# Patient Record
Sex: Male | Born: 1940 | Race: White | Hispanic: No | Marital: Single | Smoking: Former smoker
Health system: Southern US, Community
[De-identification: ages and names within clinical notes are randomized; demographics above are authoritative.]

## PROBLEM LIST (undated history)

## (undated) DIAGNOSIS — G4700 Insomnia, unspecified: Secondary | ICD-10-CM

## (undated) DIAGNOSIS — I10 Essential (primary) hypertension: Secondary | ICD-10-CM

## (undated) DIAGNOSIS — I4891 Unspecified atrial fibrillation: Secondary | ICD-10-CM

## (undated) DIAGNOSIS — F32A Depression, unspecified: Secondary | ICD-10-CM

## (undated) DIAGNOSIS — E785 Hyperlipidemia, unspecified: Secondary | ICD-10-CM

## (undated) DIAGNOSIS — E119 Type 2 diabetes mellitus without complications: Secondary | ICD-10-CM

## (undated) DIAGNOSIS — F329 Major depressive disorder, single episode, unspecified: Secondary | ICD-10-CM

## (undated) DIAGNOSIS — E039 Hypothyroidism, unspecified: Secondary | ICD-10-CM

## (undated) HISTORY — PX: SHOULDER SURGERY: SHX246

## (undated) HISTORY — PX: APPENDECTOMY: SHX54

---

## 2004-06-03 ENCOUNTER — Emergency Department (HOSPITAL_COMMUNITY): Admission: EM | Admit: 2004-06-03 | Discharge: 2004-06-03 | Payer: Self-pay | Admitting: Emergency Medicine

## 2010-08-18 ENCOUNTER — Emergency Department (HOSPITAL_COMMUNITY): Payer: PRIVATE HEALTH INSURANCE

## 2010-08-18 ENCOUNTER — Encounter: Payer: Self-pay | Admitting: Emergency Medicine

## 2010-08-18 ENCOUNTER — Emergency Department (HOSPITAL_COMMUNITY)
Admission: EM | Admit: 2010-08-18 | Discharge: 2010-08-18 | Disposition: A | Discharge status: Home / Self Care | Payer: PRIVATE HEALTH INSURANCE | Attending: Emergency Medicine | Admitting: Emergency Medicine

## 2010-08-18 DIAGNOSIS — Z87891 Personal history of nicotine dependence: Secondary | ICD-10-CM | POA: Insufficient documentation

## 2010-08-18 DIAGNOSIS — E119 Type 2 diabetes mellitus without complications: Secondary | ICD-10-CM | POA: Insufficient documentation

## 2010-08-18 DIAGNOSIS — N453 Epididymo-orchitis: Secondary | ICD-10-CM | POA: Insufficient documentation

## 2010-08-18 DIAGNOSIS — I10 Essential (primary) hypertension: Secondary | ICD-10-CM | POA: Insufficient documentation

## 2010-08-18 DIAGNOSIS — E079 Disorder of thyroid, unspecified: Secondary | ICD-10-CM | POA: Insufficient documentation

## 2010-08-18 DIAGNOSIS — E785 Hyperlipidemia, unspecified: Secondary | ICD-10-CM | POA: Insufficient documentation

## 2010-08-18 DIAGNOSIS — N390 Urinary tract infection, site not specified: Secondary | ICD-10-CM | POA: Insufficient documentation

## 2010-08-18 HISTORY — DX: Insomnia, unspecified: G47.00

## 2010-08-18 HISTORY — DX: Hyperlipidemia, unspecified: E78.5

## 2010-08-18 LAB — URINALYSIS, ROUTINE W REFLEX MICROSCOPIC
Bilirubin Urine: NEGATIVE
Nitrite: POSITIVE — AB
Protein, ur: NEGATIVE mg/dL
Specific Gravity, Urine: 1.025 (ref 1.005–1.030)
Urobilinogen, UA: 0.2 mg/dL (ref 0.0–1.0)

## 2010-08-18 LAB — BASIC METABOLIC PANEL
BUN: 17 mg/dL (ref 6–23)
Calcium: 9.4 mg/dL (ref 8.4–10.5)
Creatinine, Ser: 1.16 mg/dL (ref 0.50–1.35)
GFR calc Af Amer: >60 mL/min (ref >60)

## 2010-08-18 LAB — CBC
HCT: 44.6 % (ref 39.0–52.0)
MCHC: 33 g/dL (ref 30.0–36.0)
Platelets: 269 10*3/uL (ref 150–400)
RDW: 14.4 % (ref 11.5–15.5)
WBC: 26.8 10*3/uL — ABNORMAL HIGH (ref 4.0–10.5)

## 2010-08-18 LAB — URINE MICROSCOPIC-ADD ON

## 2010-08-18 MED ORDER — HYDROCODONE-ACETAMINOPHEN 5-325 MG PO TABS: 2.0000 | ORAL_TABLET | ORAL | Status: AC | PRN

## 2010-08-18 MED ORDER — CEFTRIAXONE SODIUM 250 MG IJ SOLR
250.0000 mg | Freq: Once | INTRAMUSCULAR | Status: AC
  Administered 2010-08-18: 250 mg via INTRAMUSCULAR
  Filled 2010-08-18: qty 250

## 2010-08-18 MED ORDER — CIPROFLOXACIN HCL 500 MG PO TABS: 500.0000 mg | ORAL_TABLET | Freq: Two times a day (BID) | ORAL | Status: AC

## 2010-08-18 MED ORDER — DOXYCYCLINE HYCLATE 100 MG PO TABS
100.0000 mg | ORAL_TABLET | Freq: Once | ORAL | Status: AC
  Administered 2010-08-18: 100 mg via ORAL
  Filled 2010-08-18: qty 1

## 2010-08-18 MED ORDER — DOXYCYCLINE HYCLATE 100 MG PO CAPS: 100.0000 mg | ORAL_CAPSULE | Freq: Two times a day (BID) | ORAL | Status: AC

## 2010-08-18 NOTE — ED Notes (Signed)
Pt c/o R groin pain radiating into back. Began late in the night yesterday. No nausea, vomiting or diarrhea.

## 2010-08-18 NOTE — ED Notes (Signed)
Hemocult performed by MD. Test negative.

## 2010-08-18 NOTE — ED Provider Notes (Signed)
Scribed for Dr. Manus Gunning, the patient was seen in room 9. This chart was scribed by Hillery Hunter. This patient's care was started at 17:50.    History     CSN: [id number] Arrival date & time: 08/18/2010  5:46 PM  Chief Complaint  Patient presents with  . Groin Pain    radiates to back and through R groin area   Patient is a 70 y.o. male presenting with testicular pain. The history is provided by the patient.  Testicle Pain This is a new problem. The current episode started more than 1 week ago. Progression since onset: waxing and waning. Pertinent negatives include no abdominal pain.    Patient complains of two weeks of right lower back pain and testicular pain that has waxed and waned and is worse with palpation of the right testicle. He denies numbness, tingling, injury to the area of pain, weakness, dysuria, hematuria, abdominal pain, fever, diarrhea, nausea, or any other complaints. He has a history of DM and denies CAD and lung dz hx.   Past Medical History  Diagnosis Date  . Diabetes mellitus   . Thyroid disease   . Hyperlipidemia   . Hypertension   . Insomnia     Past Surgical History  Procedure Date  . Appendectomy   . Shoulder surgery     Family History  Problem Relation Age of Onset  . Cancer Sister     History  Substance Use Topics  . Smoking status: Former Smoker    Quit date: 08/18/1990  . Smokeless tobacco: Never Used  . Alcohol Use: No      Review of Systems  Constitutional: Negative for fever.  Gastrointestinal: Negative for nausea, abdominal pain and diarrhea.  Genitourinary: Positive for testicular pain. Negative for dysuria and hematuria.  Neurological: Negative for numbness.  All other systems reviewed and are negative.    Physical Exam  BP 168/68  Pulse 119  Temp(Src) 98.2 F (36.8 C) (Oral)  Resp 20  Ht 5\' 9"  (1.753 m)  Wt 199 lb (90.266 kg)  BMI 29.39 kg/m2  SpO2 97%  Physical Exam  Nursing note and vitals  reviewed. Constitutional: He is oriented to person, place, and time. He appears well-developed and well-nourished.  HENT:  Head: Normocephalic and atraumatic.  Neck: Neck supple.  Cardiovascular: Normal rate and regular rhythm.   No murmur heard. Pulmonary/Chest: Breath sounds normal. No respiratory distress.  Abdominal: Bowel sounds are normal. He exhibits no distension and no mass. There is no tenderness.  Genitourinary: Rectum normal and penis normal. Guaiac negative stool (control positive).       Left testicle 3x size of right testicle and tender to palpation. No hernia appreciated  Musculoskeletal: He exhibits no edema and no tenderness.  Neurological: He is alert and oriented to person, place, and time.  Skin: Skin is warm and dry.  Psychiatric: He has a normal mood and affect. His behavior is normal.    ED Course  Procedures  OTHER DATA REVIEWED: Nursing notes, vital signs reviewed   DIAGNOSTIC STUDIES: Oxygen Saturation is 97% on room air, normal by my interpretation.     LABS / RADIOLOGY:  Results for orders placed during the hospital encounter of 08/18/10  BASIC METABOLIC PANEL      Component Value Range   Sodium 137  135 - 145 (mEq/L)   Potassium 3.6  3.5 - 5.1 (mEq/L)   Chloride 100  96 - 112 (mEq/L)   CO2 18 (*) 19 - 32 (mEq/L)  Glucose, Bld 286 (*) 70 - 99 (mg/dL)   BUN 17  6 - 23 (mg/dL)   Creatinine, Ser 4.09  0.50 - 1.35 (mg/dL)   Calcium 9.4  8.4 - 81.1 (mg/dL)   GFR calc non Af Amer >60  >60 (mL/min)   GFR calc Af Amer >60  >60 (mL/min)  CBC      Component Value Range   WBC 26.8 (*) 4.0 - 10.5 (K/uL)   RBC 5.53  4.22 - 5.81 (MIL/uL)   Hemoglobin 14.7  13.0 - 17.0 (g/dL)   HCT 91.4  78.2 - 95.6 (%)   MCV 80.7  78.0 - 100.0 (fL)   MCH 26.6  26.0 - 34.0 (pg)   MCHC 33.0  30.0 - 36.0 (g/dL)   RDW 21.3  08.6 - 57.8 (%)   Platelets 269  150 - 400 (K/uL)  URINALYSIS, ROUTINE W REFLEX MICROSCOPIC      Component Value Range   Color, Urine YELLOW   YELLOW    Appearance CLEAR  CLEAR    Specific Gravity, Urine 1.025  1.005 - 1.030    pH 5.5  5.0 - 8.0    Glucose, UA >1000 (*) NEGATIVE (mg/dL)   Hgb urine dipstick SMALL (*) NEGATIVE    Bilirubin Urine NEGATIVE  NEGATIVE    Ketones, ur 40 (*) NEGATIVE (mg/dL)   Protein, ur NEGATIVE  NEGATIVE (mg/dL)   Urobilinogen, UA 0.2  0.0 - 1.0 (mg/dL)   Nitrite POSITIVE (*) NEGATIVE    Leukocytes, UA NEGATIVE  NEGATIVE   URINE MICROSCOPIC-ADD ON      Component Value Range   Squamous Epithelial / LPF FEW (*) RARE    WBC, UA 11-20  <3 (WBC/hpf)   RBC / HPF 3-6  <3 (RBC/hpf)   Bacteria, UA MANY (*) RARE       SCROTAL ULTRASOUND DOPPLER ULTRASOUND OF THE TESTICLES  Technique: Complete ultrasound examination of the testicles, epididymis, and other scrotal structures was performed. Color and spectral Doppler ultrasound were also utilized to evaluate blood flow to the testicles.  Comparison: None  Findings:  Right testis: Normal in size and appearance, measuring 4.1 x 3.6 x 3.6 cm  Left testis: Normal in size and appearance, measuring 3.8 x 2.5 x 3.1 cm  Right epididymis: Enlarged, hypervascular epididymal head, measuring 1.2 cm.  Left epididymis: Normal in size and appearance.  Hydocele: Present bilaterally.  Varicocele: Absent bilaterally.  Pulsed Doppler interrogation of both testes demonstrates low resistance flow bilaterally.  IMPRESSION: Enlarged, hypervascular right epididymal head, suggesting epididymitis.  Normal appearance of the bilateral testes. No evidence of testicular torsion.  Original Report Authenticated By: Charline Bills, M.D.   MDM: Differential Diagnosis: Testicular pain, assymetric on exam, rule out torsion.  No prostate tenderness on exam. US scrotum, UA, chem7, CBC, symptom control.  Tolerating PO in ED.  No abdominal pain or vomiting.    IMPRESSION: Diagnoses that have been ruled out:  Diagnoses that are still under consideration:    Final diagnoses:     PLAN:  Home Narcotic pain medication The patient is to return the emergency department if there is any worsening of symptoms. I have reviewed the discharge instructions with the patient  CONDITION ON DISCHARGE: Stable   MEDICATIONS GIVEN IN THE E.D.  Medications  lisinopril (PRINIVIL,ZESTRIL) 20 MG tablet (not administered)  glyBURIDE-metformin (GLUCOVANCE) 5-500 MG per tablet (not administered)  levothyroxine (SYNTHROID, LEVOTHROID) 25 MCG tablet (not administered)  simvastatin (ZOCOR) 10 MG tablet (not administered)  zolpidem (AMBIEN) 10  MG tablet (not administered)  aspirin 325 MG tablet (not administered)     DISCHARGE MEDICATIONS: New Prescriptions   No medications on file    I personally performed the services described in this documentation, which was scribed in my presence.  The recorded information has been reviewed and considered.         Glynn Octave, MD 08/19/10 [phone]

## 2010-08-19 ENCOUNTER — Encounter (HOSPITAL_COMMUNITY): Payer: Self-pay

## 2011-03-29 ENCOUNTER — Telehealth: Payer: Self-pay

## 2011-03-29 ENCOUNTER — Other Ambulatory Visit: Payer: Self-pay

## 2011-03-29 DIAGNOSIS — Z139 Encounter for screening, unspecified: Secondary | ICD-10-CM

## 2011-03-29 NOTE — Telephone Encounter (Signed)
MOVI PREP SPLIT DOSING, REGULAR BREAKFAST. CLEAR LIQUIDS AFTER 9 AM.  HOLD GLUCOVANCE ON AM OF TCS.

## 2011-03-29 NOTE — Telephone Encounter (Signed)
Gastroenterology Pre-Procedure Form     Request Date: 03/29/2011                Requesting Physician: Dr. Regino Schultze     PATIENT INFORMATION:  Jeffrey [name] is a 71 y.o., male (DOB=[birth year]).  PROCEDURE: Procedure(s) requested: colonoscopy Procedure Reason: screening for colon cancer  PATIENT REVIEW QUESTIONS: The patient reports the following:   1. Diabetes Melitis: yes  2. Joint replacements in the past 12 months: no 3. Major health problems in the past 3 months: no 4. Has an artificial valve or MVP:no 5. Has been advised in past to take antibiotics in advance of a procedure like teeth cleaning: no}    MEDICATIONS & ALLERGIES:    Patient reports the following regarding taking any blood thinners:   Plavix? no Aspirin?no Coumadin?  no  Patient confirms/reports the following medications:  Current Outpatient Prescriptions  Medication Sig Dispense Refill  . glyBURIDE-metformin (GLUCOVANCE) 5-500 MG per tablet Take 1-2 tablets by mouth daily. Pt said he takes two tablets in the AM, one at lunch, and one at bedtime      . levothyroxine (SYNTHROID, LEVOTHROID) 25 MCG tablet Take 50 mcg by mouth daily.       Marland Kitchen lisinopril (PRINIVIL,ZESTRIL) 20 MG tablet Take 20 mg by mouth daily.        . Multiple Vitamin (MULTIVITAMIN) tablet Take 1 tablet by mouth daily.      Marland Kitchen aspirin 325 MG tablet Take 325 mg by mouth daily as needed. For headaches       . simvastatin (ZOCOR) 10 MG tablet Take 10 mg by mouth at bedtime.        Marland Kitchen zolpidem (AMBIEN) 10 MG tablet Take 10 mg by mouth at bedtime as needed. For sleep         Patient confirms/reports the following allergies:  Allergies  Allergen Reactions  . Penicillins Rash    Patient is appropriate to schedule for requested procedure(s): yes  AUTHORIZATION INFORMATION Primary Insurance:   ID #:   Group #:  Pre-Cert / Auth required:  Pre-Cert / Auth #:   Secondary Insurance:   ID #:   Group #:  Pre-Cert / Auth required:  Pre-Cert / Auth  #:  No orders of the defined types were placed in this encounter.    SCHEDULE INFORMATION: Procedure has been scheduled as follows:  Date: 04/30/2011      Time: 8:30 AM  Location: [facility] Short Stay  This Gastroenterology Pre-Precedure Form is being routed to the following provider(s) for review: Jonette Eva, MD

## 2011-03-30 NOTE — Telephone Encounter (Signed)
Rx and instructions mailed.  

## 2011-04-14 ENCOUNTER — Telehealth: Payer: Self-pay

## 2011-04-14 NOTE — Telephone Encounter (Signed)
REVIEWED.  

## 2011-04-14 NOTE — Telephone Encounter (Signed)
Called pt to update triage. No new medical problems and no new meds, except he forgot to include The Fish Oil 1200 mg daily. Added that to the med list. Pt has his prescription and instructions. His procedure is scheduled for 04/30/2011.

## 2011-04-30 ENCOUNTER — Ambulatory Visit (HOSPITAL_COMMUNITY)
Admission: RE | Admit: 2011-04-30 | Discharge: 2011-04-30 | Disposition: A | Discharge status: Home / Self Care | Payer: Medicare Other | Source: Ambulatory Visit | Attending: Gastroenterology | Admitting: Gastroenterology

## 2011-04-30 ENCOUNTER — Encounter (HOSPITAL_COMMUNITY): Payer: Self-pay | Admitting: *Deleted

## 2011-04-30 ENCOUNTER — Encounter (HOSPITAL_COMMUNITY)
Admission: RE | Disposition: A | Discharge status: Home / Self Care | Payer: Self-pay | Source: Ambulatory Visit | Attending: Gastroenterology

## 2011-04-30 DIAGNOSIS — K5730 Diverticulosis of large intestine without perforation or abscess without bleeding: Secondary | ICD-10-CM

## 2011-04-30 DIAGNOSIS — E7800 Pure hypercholesterolemia, unspecified: Secondary | ICD-10-CM | POA: Insufficient documentation

## 2011-04-30 DIAGNOSIS — K648 Other hemorrhoids: Secondary | ICD-10-CM

## 2011-04-30 DIAGNOSIS — Z1211 Encounter for screening for malignant neoplasm of colon: Secondary | ICD-10-CM | POA: Insufficient documentation

## 2011-04-30 DIAGNOSIS — E785 Hyperlipidemia, unspecified: Secondary | ICD-10-CM | POA: Insufficient documentation

## 2011-04-30 DIAGNOSIS — D126 Benign neoplasm of colon, unspecified: Secondary | ICD-10-CM

## 2011-04-30 DIAGNOSIS — Z01812 Encounter for preprocedural laboratory examination: Secondary | ICD-10-CM | POA: Insufficient documentation

## 2011-04-30 DIAGNOSIS — Z79899 Other long term (current) drug therapy: Secondary | ICD-10-CM | POA: Insufficient documentation

## 2011-04-30 DIAGNOSIS — Z139 Encounter for screening, unspecified: Secondary | ICD-10-CM

## 2011-04-30 DIAGNOSIS — E119 Type 2 diabetes mellitus without complications: Secondary | ICD-10-CM | POA: Insufficient documentation

## 2011-04-30 DIAGNOSIS — I10 Essential (primary) hypertension: Secondary | ICD-10-CM | POA: Insufficient documentation

## 2011-04-30 HISTORY — DX: Hypothyroidism, unspecified: E03.9

## 2011-04-30 HISTORY — DX: Depression, unspecified: F32.A

## 2011-04-30 HISTORY — DX: Major depressive disorder, single episode, unspecified: F32.9

## 2011-04-30 HISTORY — PX: COLONOSCOPY: SHX5424

## 2011-04-30 SURGERY — COLONOSCOPY
Anesthesia: Moderate Sedation

## 2011-04-30 MED ORDER — MIDAZOLAM HCL 5 MG/5ML IJ SOLN
INTRAMUSCULAR | Status: DC | PRN
  Administered 2011-04-30 (×2): 2 mg via INTRAVENOUS

## 2011-04-30 MED ORDER — STERILE WATER FOR IRRIGATION IR SOLN
Status: DC | PRN
  Administered 2011-04-30: 08:00:00

## 2011-04-30 MED ORDER — MEPERIDINE HCL 100 MG/ML IJ SOLN
INTRAMUSCULAR | Status: AC
  Filled 2011-04-30: qty 2

## 2011-04-30 MED ORDER — MEPERIDINE HCL 100 MG/ML IJ SOLN
INTRAMUSCULAR | Status: DC | PRN
  Administered 2011-04-30: 25 mg via INTRAVENOUS
  Administered 2011-04-30: 50 mg via INTRAVENOUS

## 2011-04-30 MED ORDER — SODIUM CHLORIDE 0.45 % IV SOLN
Freq: Once | INTRAVENOUS | Status: AC
  Administered 2011-04-30: 08:00:00 via INTRAVENOUS

## 2011-04-30 MED ORDER — MIDAZOLAM HCL 5 MG/5ML IJ SOLN
INTRAMUSCULAR | Status: AC
  Filled 2011-04-30: qty 10

## 2011-04-30 NOTE — Discharge Instructions (Signed)
You had 1 small polyps removed. You have internal hemorrhoids and diverticulosis.  FOLLOW A HIGH FIBER DIET. AVOID ITEMS THAT CAUSE BLOATING. SEE INFO BELOW. YOUR BIOPSY RESULTS SHOULD BE BACK IN 7 DAYS. Next colonoscopy in 10 years. Colonoscopy Care After Read the instructions outlined below and refer to this sheet in the next week. These discharge instructions provide you with general information on caring for yourself after you leave the hospital. While your treatment has been planned according to the most current medical practices available, unavoidable complications occasionally occur. If you have any problems or questions after discharge, call DR. Laderius Valbuena, [phone].  ACTIVITY  You may resume your regular activity, but move at a slower pace for the next 24 hours.   Take frequent rest periods for the next 24 hours.   Walking will help get rid of the air and reduce the bloated feeling in your belly (abdomen).   No driving for 24 hours (because of the medicine (anesthesia) used during the test).   You may shower.   Do not sign any important legal documents or operate any machinery for 24 hours (because of the anesthesia used during the test).    NUTRITION  Drink plenty of fluids.   You may resume your normal diet as instructed by your doctor.   Begin with a light meal and progress to your normal diet. Heavy or fried foods are harder to digest and may make you feel sick to your stomach (nauseated).   Avoid alcoholic beverages for 24 hours or as instructed.    MEDICATIONS  You may resume your normal medications.   WHAT YOU CAN EXPECT TODAY  Some feelings of bloating in the abdomen.   Passage of more gas than usual.   Spotting of blood in your stool or on the toilet paper  .  IF YOU HAD POLYPS REMOVED DURING THE COLONOSCOPY:  Eat a soft diet IF YOU HAVE NAUSEA, BLOATING, ABDOMINAL PAIN, OR VOMITING.    FINDING OUT THE RESULTS OF YOUR TEST Not all test results  are available during your visit. DR. Darrick Penna WILL CALL YOU WITHIN 7 DAYS OF YOUR PROCEDUE WITH YOUR RESULTS. Do not assume everything is normal if you have not heard from DR. Tremain Rucinski IN ONE WEEK, CALL HER OFFICE AT [phone].  SEEK IMMEDIATE MEDICAL ATTENTION AND CALL THE OFFICE: [phone] IF:  You have more than a spotting of blood in your stool.   Your belly is swollen (abdominal distention).   You are nauseated or vomiting.   You have a temperature over 101F.   You have abdominal pain or discomfort that is severe or gets worse throughout the day.  Polyps, Colon  A polyp is extra tissue that grows inside your body. Colon polyps grow in the large intestine. The large intestine, also called the colon, is part of your digestive system. It is a long, hollow tube at the end of your digestive tract where your body makes and stores stool. Most polyps are not dangerous. They are benign. This means they are not cancerous. But over time, some types of polyps can turn into cancer. Polyps that are smaller than a pea are usually not harmful. But larger polyps could someday become or may already be cancerous. To be safe, doctors remove all polyps and test them.   WHO GETS POLYPS? Anyone can get polyps, but certain people are more likely than others. You may have a greater chance of getting polyps if:  You are over 50.  You have had polyps before.   Someone in your family has had polyps.   Someone in your family has had cancer of the large intestine.   Find out if someone in your family has had polyps. You may also be more likely to get polyps if you:   Eat a lot of fatty foods   Smoke   Drink alcohol   Do not exercise  Eat too much   TREATMENT  The caregiver will remove the polyp during sigmoidoscopy or colonoscopy.  PREVENTION There is not one sure way to prevent polyps. You might be able to lower your risk of getting them if you:  Eat more fruits and vegetables and less fatty  food.   Do not smoke.   Avoid alcohol.   Exercise every day.   Lose weight if you are overweight.   Eating more calcium and folate can also lower your risk of getting polyps. Some foods that are rich in calcium are milk, cheese, and broccoli. Some foods that are rich in folate are chickpeas, kidney beans, and spinach.   High-Fiber Diet A high-fiber diet changes your normal diet to include more whole grains, legumes, fruits, and vegetables. Changes in the diet involve replacing refined carbohydrates with unrefined foods. The calorie level of the diet is essentially unchanged. The Dietary Reference Intake (recommended amount) for adult males is 38 grams per day. For adult females, it is 25 grams per day. Pregnant and lactating women should consume 28 grams of fiber per day. Fiber is the intact part of a plant that is not broken down during digestion. Functional fiber is fiber that has been isolated from the plant to provide a beneficial effect in the body. PURPOSE  Increase stool bulk.   Ease and regulate bowel movements.   Lower cholesterol.  INDICATIONS THAT YOU NEED MORE FIBER  Constipation and hemorrhoids.   Uncomplicated diverticulosis (intestine condition) and irritable bowel syndrome.   Weight management.   As a protective measure against hardening of the arteries (atherosclerosis), diabetes, and cancer.   GUIDELINES FOR INCREASING FIBER IN THE DIET  Start adding fiber to the diet slowly. A gradual increase of about 5 more grams (2 slices of whole-wheat bread, 2 servings of most fruits or vegetables, or 1 bowl of high-fiber cereal) per day is best. Too rapid an increase in fiber may result in constipation, flatulence, and bloating.   Drink enough water and fluids to keep your urine clear or pale yellow. Water, juice, or caffeine-free drinks are recommended. Not drinking enough fluid may cause constipation.   Eat a variety of high-fiber foods rather than one type of fiber.     Try to increase your intake of fiber through using high-fiber foods rather than fiber pills or supplements that contain small amounts of fiber.   The goal is to change the types of food eaten. Do not supplement your present diet with high-fiber foods, but replace foods in your present diet.  INCLUDE A VARIETY OF FIBER SOURCES  Replace refined and processed grains with whole grains, canned fruits with fresh fruits, and incorporate other fiber sources. White rice, white breads, and most bakery goods contain little or no fiber.   Brown whole-grain rice, buckwheat oats, and many fruits and vegetables are all good sources of fiber. These include: broccoli, Brussels sprouts, cabbage, cauliflower, beets, sweet potatoes, white potatoes (skin on), carrots, tomatoes, eggplant, squash, berries, fresh fruits, and dried fruits.   Cereals appear to be the richest source of fiber.  Cereal fiber is found in whole grains and bran. Bran is the fiber-rich outer coat of cereal grain, which is largely removed in refining. In whole-grain cereals, the bran remains. In breakfast cereals, the largest amount of fiber is found in those with "bran" in their names. The fiber content is sometimes indicated on the label.   You may need to include additional fruits and vegetables each day.   In baking, for 1 cup white flour, you may use the following substitutions:   1 cup whole-wheat flour minus 2 tablespoons.   1/2 cup white flour plus 1/2 cup whole-wheat flour.   Diverticulosis Diverticulosis is a common condition that develops when small pouches (diverticula) form in the wall of the colon. The risk of diverticulosis increases with age. It happens more often in people who eat a low-fiber diet. Most individuals with diverticulosis have no symptoms. Those individuals with symptoms usually experience belly (abdominal) pain, constipation, or loose stools (diarrhea).  HOME CARE INSTRUCTIONS  Increase the amount of fiber in  your diet as directed by your caregiver or dietician. This may reduce symptoms of diverticulosis.   Drink at least 6 to 8 glasses of water each day to prevent constipation.   Try not to strain when you have a bowel movement.   Avoiding nuts and seeds to prevent complications is still an uncertain benefit.       FOODS HAVING HIGH FIBER CONTENT INCLUDE:  Fruits. Apple, peach, pear, tangerine, raisins, prunes.   Vegetables. Brussels sprouts, asparagus, broccoli, cabbage, carrot, cauliflower, romaine lettuce, spinach, summer squash, tomato, winter squash, zucchini.   Starchy Vegetables. Baked beans, kidney beans, lima beans, split peas, lentils, potatoes (with skin).   Grains. Whole wheat bread, brown rice, bran flake cereal, plain oatmeal, white rice, shredded wheat, bran muffins.    SEEK IMMEDIATE MEDICAL CARE IF:  You develop increasing pain or severe bloating.   You have an oral temperature above 101F.   You develop vomiting or bowel movements that are bloody or black.   Hemorrhoids Hemorrhoids are dilated (enlarged) veins around the rectum. Sometimes clots will form in the veins. This makes them swollen and painful. These are called thrombosed hemorrhoids. Causes of hemorrhoids include:  Constipation.   Straining to have a bowel movement.   HEAVY LIFTING HOME CARE INSTRUCTIONS  Eat a well balanced diet and drink 6 to 8 glasses of water every day to avoid constipation. You may also use a bulk laxative.   Avoid straining to have bowel movements.   Keep anal area dry and clean.   Do not use a donut shaped pillow or sit on the toilet for long periods. This increases blood pooling and pain.   Move your bowels when your body has the urge; this will require less straining and will decrease pain and pressure.

## 2011-04-30 NOTE — H&P (Signed)
  Primary Care Physician:  Kirk Ruths, MD, MD Primary Gastroenterologist:  Dr. Darrick Penna  Pre-Procedure History & Physical: HPI:  Jeffrey [name] is a 71 y.o. male here for screening  Past Medical History  Diagnosis Date  . Diabetes mellitus   . Thyroid disease   . Hyperlipidemia   . Hypertension   . Insomnia   . Hypothyroidism   . Hypercholesteremia   . Depression     Past Surgical History  Procedure Date  . Appendectomy   . Shoulder surgery     Prior to Admission medications   Medication Sig Start Date End Date Taking? Authorizing Provider  fish oil-omega-3 fatty acids 1000 MG capsule Take 1,200 mg by mouth daily.   Yes Historical Provider, MD  glyBURIDE-metformin (GLUCOVANCE) 5-500 MG per tablet Take 1-2 tablets by mouth daily. Pt said he takes two tablets in the AM, one at lunch, and one at bedtime   Yes Historical Provider, MD  levothyroxine (SYNTHROID, LEVOTHROID) 25 MCG tablet Take 50 mcg by mouth daily.    Yes Historical Provider, MD  lisinopril (PRINIVIL,ZESTRIL) 20 MG tablet Take 20 mg by mouth daily.     Yes Historical Provider, MD  naproxen sodium (ANAPROX) 220 MG tablet Take 220 mg by mouth 2 (two) times daily with a meal.   Yes Historical Provider, MD  aspirin 325 MG tablet Take 325 mg by mouth daily as needed. For headaches     Historical Provider, MD  Multiple Vitamin (MULTIVITAMIN) tablet Take 1 tablet by mouth daily.    Historical Provider, MD  simvastatin (ZOCOR) 10 MG tablet Take 10 mg by mouth at bedtime.      Historical Provider, MD  zolpidem (AMBIEN) 10 MG tablet Take 10 mg by mouth at bedtime as needed. For sleep     Historical Provider, MD    Allergies as of 03/29/2011 - Review Complete 03/29/2011  Allergen Reaction Noted  . Penicillins Rash 08/18/2010    Family History  Problem Relation Age of Onset  . Cancer Sister   . Colon cancer Neg Hx     History   Social History  . Marital Status: Single    Spouse Name: N/A    Number of Children:  N/A  . Years of Education: N/A   Occupational History  . Not on file.   Social History Main Topics  . Smoking status: Former Smoker -- 0.5 packs/day    Quit date: 08/18/1990  . Smokeless tobacco: Never Used  . Alcohol Use: No  . Drug Use: No  . Sexually Active: No   Other Topics Concern  . Not on file   Social History Narrative  . No narrative on file    Review of Systems: See HPI, otherwise negative ROS   Physical Exam: BP 191/94  Pulse 86  Temp(Src) 97.8 F (36.6 C) (Oral)  Resp 21  Ht 5\' 9"  (1.753 m)  Wt 192 lb (87.091 kg)  BMI 28.35 kg/m2  SpO2 96% General:   Alert,  pleasant and cooperative in NAD Head:  Normocephalic and atraumatic. Neck:  Supple; Lungs:  Clear throughout to auscultation.    Heart:  Regular rate and rhythm. Abdomen:  Soft, nontender and nondistended. Normal bowel sounds, without guarding, and without rebound.   Neurologic:  Alert and  oriented x4;  grossly normal neurologically.  Impression/Plan:     AVERAGE RISK  PLAN: TCS TODAY

## 2011-04-30 NOTE — Op Note (Signed)
[facility] [address] [place], [place]  [address]  COLONOSCOPY PROCEDURE REPORT  PATIENT:  Jeffrey [name], Jeffrey [name]  MR#:  [id number] BIRTHDATE:  1940-09-27, 70 yrs. old  GENDER:  male  ENDOSCOPIST:  Jonette Eva, MD REF. BY:  Karleen Hampshire, M.D. ASSISTANT:  PROCEDURE DATE:  04/30/2011 PROCEDURE:  Colonoscopy with snare polypectomy  INDICATIONS:  SCREENING  MEDICATIONS:   Demerol 75 mg IV, Versed 4 mg IV  DESCRIPTION OF PROCEDURE:    Physical exam was performed. Informed consent was obtained from the patient after explaining the benefits, risks, and alternatives to procedure.  The patient was connected to monitor and placed in left lateral position. Continuous oxygen was provided by nasal cannula and IV medicine administered through an indwelling cannula.  After administration of sedation and rectal exam, the patient's rectum was intubated and the EC-[id number] ([id number]) colonoscope was advanced under direct visualization to the cecum.  The scope was removed slowly by carefully examining the color, texture, anatomy, and integrity mucosa on the way out.  The patient was recovered in endoscopy and discharged home in satisfactory condition. <<PROCEDUREIMAGES>>  FINDINGS:  A sessile polyp was found at the hepatic flexure  REMOVED VIA SNARE CAUTERY.  RARE Diverticula were found in the sigmoid to descending colon segments.  SMALL Internal Hemorrhoids were found.  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  n. eSIGNED:   Tacie Mccuistion at 04/30/2011 03:05 PM  Felicita Gage, [phone]

## 2011-05-04 ENCOUNTER — Encounter (HOSPITAL_COMMUNITY): Payer: Self-pay | Admitting: Gastroenterology

## 2011-05-05 ENCOUNTER — Telehealth: Payer: Self-pay | Admitting: Gastroenterology

## 2011-05-05 NOTE — Telephone Encounter (Signed)
Faxed to PCP

## 2011-05-05 NOTE — Telephone Encounter (Signed)
Please call pt. He had HYPERPLASTIC POLYPS removed from HIS colon. FOLLOW A High fiber diet. TCS in 10 years.    

## 2011-05-05 NOTE — Telephone Encounter (Signed)
Called and informed pt.  

## 2014-05-14 ENCOUNTER — Encounter (HOSPITAL_COMMUNITY): Payer: Self-pay

## 2014-05-14 ENCOUNTER — Inpatient Hospital Stay (HOSPITAL_COMMUNITY)
Admission: EM | Admit: 2014-05-14 | Discharge: 2014-06-12 | DRG: 216 | Disposition: E | Payer: Commercial Managed Care - HMO | Attending: Cardiothoracic Surgery | Admitting: Cardiothoracic Surgery

## 2014-05-14 ENCOUNTER — Emergency Department (HOSPITAL_COMMUNITY): Payer: Commercial Managed Care - HMO

## 2014-05-14 ENCOUNTER — Other Ambulatory Visit (HOSPITAL_COMMUNITY): Payer: Self-pay

## 2014-05-14 DIAGNOSIS — I6522 Occlusion and stenosis of left carotid artery: Secondary | ICD-10-CM | POA: Diagnosis not present

## 2014-05-14 DIAGNOSIS — I2510 Atherosclerotic heart disease of native coronary artery without angina pectoris: Secondary | ICD-10-CM | POA: Diagnosis present

## 2014-05-14 DIAGNOSIS — G4700 Insomnia, unspecified: Secondary | ICD-10-CM | POA: Diagnosis present

## 2014-05-14 DIAGNOSIS — I2582 Chronic total occlusion of coronary artery: Secondary | ICD-10-CM | POA: Diagnosis present

## 2014-05-14 DIAGNOSIS — E871 Hypo-osmolality and hyponatremia: Secondary | ICD-10-CM | POA: Diagnosis not present

## 2014-05-14 DIAGNOSIS — Z7982 Long term (current) use of aspirin: Secondary | ICD-10-CM

## 2014-05-14 DIAGNOSIS — Z419 Encounter for procedure for purposes other than remedying health state, unspecified: Secondary | ICD-10-CM

## 2014-05-14 DIAGNOSIS — A047 Enterocolitis due to Clostridium difficile: Secondary | ICD-10-CM | POA: Diagnosis not present

## 2014-05-14 DIAGNOSIS — R410 Disorientation, unspecified: Secondary | ICD-10-CM | POA: Diagnosis not present

## 2014-05-14 DIAGNOSIS — E785 Hyperlipidemia, unspecified: Secondary | ICD-10-CM | POA: Diagnosis present

## 2014-05-14 DIAGNOSIS — I6523 Occlusion and stenosis of bilateral carotid arteries: Secondary | ICD-10-CM | POA: Diagnosis present

## 2014-05-14 DIAGNOSIS — D696 Thrombocytopenia, unspecified: Secondary | ICD-10-CM | POA: Diagnosis not present

## 2014-05-14 DIAGNOSIS — N179 Acute kidney failure, unspecified: Secondary | ICD-10-CM | POA: Diagnosis present

## 2014-05-14 DIAGNOSIS — Z95828 Presence of other vascular implants and grafts: Secondary | ICD-10-CM

## 2014-05-14 DIAGNOSIS — I425 Other restrictive cardiomyopathy: Secondary | ICD-10-CM | POA: Diagnosis not present

## 2014-05-14 DIAGNOSIS — E872 Acidosis: Secondary | ICD-10-CM | POA: Diagnosis not present

## 2014-05-14 DIAGNOSIS — E039 Hypothyroidism, unspecified: Secondary | ICD-10-CM

## 2014-05-14 DIAGNOSIS — I214 Non-ST elevation (NSTEMI) myocardial infarction: Secondary | ICD-10-CM | POA: Diagnosis present

## 2014-05-14 DIAGNOSIS — R6521 Severe sepsis with septic shock: Secondary | ICD-10-CM | POA: Diagnosis not present

## 2014-05-14 DIAGNOSIS — I4901 Ventricular fibrillation: Secondary | ICD-10-CM | POA: Insufficient documentation

## 2014-05-14 DIAGNOSIS — Z515 Encounter for palliative care: Secondary | ICD-10-CM

## 2014-05-14 DIAGNOSIS — T426X5A Adverse effect of other antiepileptic and sedative-hypnotic drugs, initial encounter: Secondary | ICD-10-CM | POA: Diagnosis not present

## 2014-05-14 DIAGNOSIS — A419 Sepsis, unspecified organism: Secondary | ICD-10-CM | POA: Diagnosis not present

## 2014-05-14 DIAGNOSIS — R0602 Shortness of breath: Secondary | ICD-10-CM

## 2014-05-14 DIAGNOSIS — I97618 Postprocedural hemorrhage and hematoma of a circulatory system organ or structure following other circulatory system procedure: Secondary | ICD-10-CM | POA: Diagnosis not present

## 2014-05-14 DIAGNOSIS — E118 Type 2 diabetes mellitus with unspecified complications: Secondary | ICD-10-CM | POA: Diagnosis not present

## 2014-05-14 DIAGNOSIS — R040 Epistaxis: Secondary | ICD-10-CM | POA: Diagnosis not present

## 2014-05-14 DIAGNOSIS — Z79899 Other long term (current) drug therapy: Secondary | ICD-10-CM | POA: Diagnosis not present

## 2014-05-14 DIAGNOSIS — I5021 Acute systolic (congestive) heart failure: Secondary | ICD-10-CM | POA: Diagnosis not present

## 2014-05-14 DIAGNOSIS — Z9689 Presence of other specified functional implants: Secondary | ICD-10-CM

## 2014-05-14 DIAGNOSIS — I4729 Other ventricular tachycardia: Secondary | ICD-10-CM

## 2014-05-14 DIAGNOSIS — I469 Cardiac arrest, cause unspecified: Secondary | ICD-10-CM | POA: Diagnosis not present

## 2014-05-14 DIAGNOSIS — Z951 Presence of aortocoronary bypass graft: Secondary | ICD-10-CM

## 2014-05-14 DIAGNOSIS — I481 Persistent atrial fibrillation: Secondary | ICD-10-CM | POA: Diagnosis not present

## 2014-05-14 DIAGNOSIS — Z87891 Personal history of nicotine dependence: Secondary | ICD-10-CM

## 2014-05-14 DIAGNOSIS — E876 Hypokalemia: Secondary | ICD-10-CM | POA: Diagnosis not present

## 2014-05-14 DIAGNOSIS — D62 Acute posthemorrhagic anemia: Secondary | ICD-10-CM | POA: Diagnosis not present

## 2014-05-14 DIAGNOSIS — I4891 Unspecified atrial fibrillation: Secondary | ICD-10-CM | POA: Insufficient documentation

## 2014-05-14 DIAGNOSIS — J690 Pneumonitis due to inhalation of food and vomit: Secondary | ICD-10-CM | POA: Diagnosis not present

## 2014-05-14 DIAGNOSIS — E43 Unspecified severe protein-calorie malnutrition: Secondary | ICD-10-CM | POA: Diagnosis not present

## 2014-05-14 DIAGNOSIS — I2589 Other forms of chronic ischemic heart disease: Secondary | ICD-10-CM | POA: Diagnosis not present

## 2014-05-14 DIAGNOSIS — I10 Essential (primary) hypertension: Secondary | ICD-10-CM | POA: Diagnosis present

## 2014-05-14 DIAGNOSIS — I472 Ventricular tachycardia, unspecified: Secondary | ICD-10-CM

## 2014-05-14 DIAGNOSIS — Z95811 Presence of heart assist device: Secondary | ICD-10-CM | POA: Diagnosis not present

## 2014-05-14 DIAGNOSIS — I5023 Acute on chronic systolic (congestive) heart failure: Secondary | ICD-10-CM | POA: Diagnosis not present

## 2014-05-14 DIAGNOSIS — D509 Iron deficiency anemia, unspecified: Secondary | ICD-10-CM | POA: Diagnosis not present

## 2014-05-14 DIAGNOSIS — I25110 Atherosclerotic heart disease of native coronary artery with unstable angina pectoris: Secondary | ICD-10-CM | POA: Diagnosis not present

## 2014-05-14 DIAGNOSIS — R0600 Dyspnea, unspecified: Secondary | ICD-10-CM | POA: Diagnosis present

## 2014-05-14 DIAGNOSIS — I4510 Unspecified right bundle-branch block: Secondary | ICD-10-CM | POA: Diagnosis present

## 2014-05-14 DIAGNOSIS — R198 Other specified symptoms and signs involving the digestive system and abdomen: Secondary | ICD-10-CM

## 2014-05-14 DIAGNOSIS — I4892 Unspecified atrial flutter: Secondary | ICD-10-CM | POA: Diagnosis not present

## 2014-05-14 DIAGNOSIS — I255 Ischemic cardiomyopathy: Secondary | ICD-10-CM | POA: Diagnosis present

## 2014-05-14 DIAGNOSIS — Z452 Encounter for adjustment and management of vascular access device: Secondary | ICD-10-CM

## 2014-05-14 DIAGNOSIS — E119 Type 2 diabetes mellitus without complications: Secondary | ICD-10-CM

## 2014-05-14 DIAGNOSIS — R570 Cardiogenic shock: Secondary | ICD-10-CM | POA: Diagnosis not present

## 2014-05-14 DIAGNOSIS — I509 Heart failure, unspecified: Secondary | ICD-10-CM | POA: Diagnosis not present

## 2014-05-14 DIAGNOSIS — Z4659 Encounter for fitting and adjustment of other gastrointestinal appliance and device: Secondary | ICD-10-CM

## 2014-05-14 DIAGNOSIS — Z8249 Family history of ischemic heart disease and other diseases of the circulatory system: Secondary | ICD-10-CM

## 2014-05-14 DIAGNOSIS — J9690 Respiratory failure, unspecified, unspecified whether with hypoxia or hypercapnia: Secondary | ICD-10-CM | POA: Diagnosis not present

## 2014-05-14 DIAGNOSIS — N359 Urethral stricture, unspecified: Secondary | ICD-10-CM | POA: Diagnosis present

## 2014-05-14 DIAGNOSIS — I480 Paroxysmal atrial fibrillation: Secondary | ICD-10-CM | POA: Diagnosis present

## 2014-05-14 DIAGNOSIS — R58 Hemorrhage, not elsewhere classified: Secondary | ICD-10-CM

## 2014-05-14 DIAGNOSIS — I482 Chronic atrial fibrillation: Secondary | ICD-10-CM | POA: Diagnosis not present

## 2014-05-14 HISTORY — DX: Essential (primary) hypertension: I10

## 2014-05-14 HISTORY — DX: Unspecified atrial fibrillation: I48.91

## 2014-05-14 HISTORY — DX: Type 2 diabetes mellitus without complications: E11.9

## 2014-05-14 LAB — CBC WITH DIFFERENTIAL/PLATELET
Basophils Absolute: 0.1 10*3/uL (ref 0.0–0.1)
Basophils Relative: 1 % (ref 0–1)
Eosinophils Absolute: 0.2 10*3/uL (ref 0.0–0.7)
Eosinophils Relative: 2 % (ref 0–5)
HCT: 42 % (ref 39.0–52.0)
Hemoglobin: 13 g/dL (ref 13.0–17.0)
Lymphocytes Relative: 13 % (ref 12–46)
Lymphs Abs: 1.2 10*3/uL (ref 0.7–4.0)
MCH: 24.2 pg — ABNORMAL LOW (ref 26.0–34.0)
MCHC: 31 g/dL (ref 30.0–36.0)
MCV: 78.1 fL (ref 78.0–100.0)
Monocytes Absolute: 0.5 10*3/uL (ref 0.1–1.0)
Monocytes Relative: 6 % (ref 3–12)
Neutro Abs: 7.1 10*3/uL (ref 1.7–7.7)
Neutrophils Relative %: 78 % — ABNORMAL HIGH (ref 43–77)
Platelets: 271 10*3/uL (ref 150–400)
RBC: 5.38 MIL/uL (ref 4.22–5.81)
RDW: 17.8 % — ABNORMAL HIGH (ref 11.5–15.5)
WBC: 9.1 10*3/uL (ref 4.0–10.5)

## 2014-05-14 LAB — BASIC METABOLIC PANEL
Anion gap: 9 (ref 5–15)
BUN: 33 mg/dL — ABNORMAL HIGH (ref 6–20)
CO2: 21 mmol/L — ABNORMAL LOW (ref 22–32)
Calcium: 8.7 mg/dL — ABNORMAL LOW (ref 8.9–10.3)
Chloride: 111 mmol/L (ref 101–111)
Creatinine, Ser: 1.19 mg/dL (ref 0.61–1.24)
GFR calc Af Amer: >60 mL/min (ref >60)
GFR calc non Af Amer: 60 mL/min — ABNORMAL LOW (ref >60)
Glucose, Bld: 212 mg/dL — ABNORMAL HIGH (ref 70–99)
Potassium: 4.1 mmol/L (ref 3.5–5.1)
Sodium: 141 mmol/L (ref 135–145)

## 2014-05-14 LAB — TROPONIN I
TROPONIN I: 0.4 ng/mL — AB (ref <0.031)
Troponin I: 0.39 ng/mL — ABNORMAL HIGH (ref <0.031)
Troponin I: 0.45 ng/mL — ABNORMAL HIGH (ref <0.031)

## 2014-05-14 LAB — MAGNESIUM: Magnesium: 1.5 mg/dL — ABNORMAL LOW (ref 1.7–2.4)

## 2014-05-14 LAB — CBG MONITORING, ED: Glucose-Capillary: 276 mg/dL — ABNORMAL HIGH (ref 70–99)

## 2014-05-14 LAB — BRAIN NATRIURETIC PEPTIDE: B Natriuretic Peptide: 885 pg/mL — ABNORMAL HIGH (ref 0.0–100.0)

## 2014-05-14 LAB — GLUCOSE, CAPILLARY: Glucose-Capillary: 139 mg/dL — ABNORMAL HIGH (ref 70–99)

## 2014-05-14 LAB — TSH: TSH: 6.794 u[IU]/mL — ABNORMAL HIGH (ref 0.350–4.500)

## 2014-05-14 LAB — MRSA PCR SCREENING: MRSA BY PCR: NEGATIVE

## 2014-05-14 MED ORDER — ASPIRIN EC 81 MG PO TBEC: 81.0000 mg | DELAYED_RELEASE_TABLET | Freq: Every day | ORAL | Status: DC

## 2014-05-14 MED ORDER — ONDANSETRON HCL 4 MG/2ML IJ SOLN: 4.0000 mg | Freq: Four times a day (QID) | INTRAMUSCULAR | Status: DC | PRN

## 2014-05-14 MED ORDER — NITROGLYCERIN 2 % TD OINT
1.0000 [in_us] | TOPICAL_OINTMENT | Freq: Four times a day (QID) | TRANSDERMAL | Status: DC
  Administered 2014-05-14 – 2014-05-17 (×7): 1 [in_us] via TOPICAL
  Filled 2014-05-14 (×4): qty 1
  Filled 2014-05-14: qty 30
  Filled 2014-05-14 (×2): qty 1

## 2014-05-14 MED ORDER — GLIPIZIDE 5 MG PO TABS
5.0000 mg | ORAL_TABLET | Freq: Two times a day (BID) | ORAL | Status: DC
  Administered 2014-05-15 – 2014-05-22 (×14): 5 mg via ORAL
  Filled 2014-05-14 (×19): qty 1

## 2014-05-14 MED ORDER — MORPHINE SULFATE 4 MG/ML IJ SOLN
4.0000 mg | INTRAMUSCULAR | Status: DC | PRN
Start: 2014-05-14 — End: 2014-05-23
  Administered 2014-05-17: 2 mg via INTRAVENOUS
  Filled 2014-05-14: qty 1

## 2014-05-14 MED ORDER — ASPIRIN 81 MG PO CHEW
324.0000 mg | CHEWABLE_TABLET | Freq: Once | ORAL | Status: AC
  Administered 2014-05-14: 324 mg via ORAL
  Filled 2014-05-14: qty 4

## 2014-05-14 MED ORDER — ADULT MULTIVITAMIN W/MINERALS CH
1.0000 | ORAL_TABLET | Freq: Every day | ORAL | Status: DC
  Administered 2014-05-15 – 2014-05-22 (×7): 1 via ORAL
  Filled 2014-05-14 (×11): qty 1

## 2014-05-14 MED ORDER — HEPARIN (PORCINE) IN NACL 100-0.45 UNIT/ML-% IJ SOLN
1200.0000 [IU]/h | INTRAMUSCULAR | Status: DC
  Administered 2014-05-14: 1200 [IU]/h via INTRAVENOUS
  Administered 2014-05-15: 1400 [IU]/h via INTRAVENOUS
  Administered 2014-05-17 – 2014-05-18 (×2): 1200 [IU]/h via INTRAVENOUS
  Filled 2014-05-14 (×10): qty 250

## 2014-05-14 MED ORDER — INSULIN ASPART 100 UNIT/ML ~~LOC~~ SOLN
0.0000 [IU] | Freq: Three times a day (TID) | SUBCUTANEOUS | Status: DC
  Administered 2014-05-15: 4 [IU] via SUBCUTANEOUS
  Administered 2014-05-15: 3 [IU] via SUBCUTANEOUS
  Administered 2014-05-15: 4 [IU] via SUBCUTANEOUS
  Administered 2014-05-16: 7 [IU] via SUBCUTANEOUS
  Administered 2014-05-17: 11 [IU] via SUBCUTANEOUS
  Administered 2014-05-17: 7 [IU] via SUBCUTANEOUS
  Administered 2014-05-17: 11 [IU] via SUBCUTANEOUS
  Administered 2014-05-18 (×2): 4 [IU] via SUBCUTANEOUS
  Administered 2014-05-18 – 2014-05-20 (×4): 3 [IU] via SUBCUTANEOUS
  Administered 2014-05-20 – 2014-05-21 (×3): 4 [IU] via SUBCUTANEOUS
  Administered 2014-05-21: 7 [IU] via SUBCUTANEOUS
  Administered 2014-05-22: 3 [IU] via SUBCUTANEOUS
  Administered 2014-05-22: 4 [IU] via SUBCUTANEOUS
  Administered 2014-05-22: 7 [IU] via SUBCUTANEOUS

## 2014-05-14 MED ORDER — LISINOPRIL 20 MG PO TABS
20.0000 mg | ORAL_TABLET | Freq: Every day | ORAL | Status: DC
  Administered 2014-05-14 – 2014-05-15 (×2): 20 mg via ORAL
  Filled 2014-05-14 (×2): qty 2
  Filled 2014-05-14: qty 1

## 2014-05-14 MED ORDER — LEVOTHYROXINE SODIUM 25 MCG PO TABS: 50.0000 ug | ORAL_TABLET | Freq: Every day | ORAL | Status: DC

## 2014-05-14 MED ORDER — METFORMIN HCL 500 MG PO TABS
1000.0000 mg | ORAL_TABLET | Freq: Two times a day (BID) | ORAL | Status: DC
  Filled 2014-05-14: qty 2

## 2014-05-14 MED ORDER — ZOLPIDEM TARTRATE 5 MG PO TABS: 10.0000 mg | ORAL_TABLET | Freq: Every evening | ORAL | Status: DC | PRN

## 2014-05-14 MED ORDER — METFORMIN HCL 500 MG PO TABS
1000.0000 mg | ORAL_TABLET | Freq: Two times a day (BID) | ORAL | Status: DC
  Administered 2014-05-15: 1000 mg via ORAL
  Filled 2014-05-14: qty 2

## 2014-05-14 MED ORDER — ZOLPIDEM TARTRATE 5 MG PO TABS
5.0000 mg | ORAL_TABLET | Freq: Every evening | ORAL | Status: DC | PRN
  Administered 2014-05-21: 5 mg via ORAL
  Filled 2014-05-14: qty 1

## 2014-05-14 MED ORDER — ATORVASTATIN CALCIUM 20 MG PO TABS
20.0000 mg | ORAL_TABLET | Freq: Every day | ORAL | Status: DC
  Administered 2014-05-14 – 2014-05-17 (×4): 20 mg via ORAL
  Filled 2014-05-14 (×5): qty 1

## 2014-05-14 MED ORDER — ASPIRIN EC 81 MG PO TBEC
81.0000 mg | DELAYED_RELEASE_TABLET | Freq: Every day | ORAL | Status: DC
  Administered 2014-05-15 – 2014-05-22 (×7): 81 mg via ORAL
  Filled 2014-05-14 (×9): qty 1

## 2014-05-14 MED ORDER — LEVOTHYROXINE SODIUM 50 MCG PO TABS
50.0000 ug | ORAL_TABLET | Freq: Every day | ORAL | Status: DC
  Administered 2014-05-15 – 2014-05-22 (×7): 50 ug via ORAL
  Filled 2014-05-14: qty 1
  Filled 2014-05-14: qty 2
  Filled 2014-05-14 (×5): qty 1
  Filled 2014-05-14: qty 2
  Filled 2014-05-14 (×2): qty 1

## 2014-05-14 MED ORDER — METOPROLOL TARTRATE 25 MG PO TABS
25.0000 mg | ORAL_TABLET | Freq: Two times a day (BID) | ORAL | Status: DC
  Administered 2014-05-14 – 2014-05-15 (×2): 25 mg via ORAL
  Filled 2014-05-14 (×2): qty 1

## 2014-05-14 MED ORDER — INSULIN ASPART 100 UNIT/ML ~~LOC~~ SOLN
0.0000 [IU] | Freq: Every day | SUBCUTANEOUS | Status: DC
  Administered 2014-05-16: 3 [IU] via SUBCUTANEOUS
  Administered 2014-05-17: 2 [IU] via SUBCUTANEOUS

## 2014-05-14 MED ORDER — OMEGA-3-ACID ETHYL ESTERS 1 G PO CAPS
1000.0000 mg | ORAL_CAPSULE | Freq: Every day | ORAL | Status: DC
  Administered 2014-05-15 – 2014-05-17 (×3): 1000 mg via ORAL
  Filled 2014-05-14 (×3): qty 1

## 2014-05-14 MED ORDER — ONDANSETRON HCL 4 MG PO TABS: 4.0000 mg | ORAL_TABLET | Freq: Four times a day (QID) | ORAL | Status: DC | PRN

## 2014-05-14 MED ORDER — SODIUM CHLORIDE 0.9 % IJ SOLN
3.0000 mL | Freq: Two times a day (BID) | INTRAMUSCULAR | Status: DC
  Administered 2014-05-14 – 2014-05-22 (×10): 3 mL via INTRAVENOUS

## 2014-05-14 MED ORDER — HEPARIN BOLUS VIA INFUSION
4000.0000 [IU] | Freq: Once | INTRAVENOUS | Status: AC
  Administered 2014-05-14: 4000 [IU] via INTRAVENOUS
  Filled 2014-05-14: qty 4000

## 2014-05-14 NOTE — Progress Notes (Signed)
ANTICOAGULATION CONSULT NOTE - Initial Consult  Pharmacy Consult for Heparin Indication: chest pain/ACS  Allergies  Allergen Reactions  . Penicillins Rash   Patient Measurements: Height: 5\' 8"  (172.7 cm) Weight: 190 lb (86.183 kg) IBW/kg (Calculated) : 68.4  Vital Signs: Temp: 97.4 F (36.3 C) (05/03 1756) Temp Source: Oral (05/03 1756) BP: 151/94 mmHg (05/03 2210) Pulse Rate: 118 (05/03 2210)  Labs:  Recent Labs  06/10/2014 1630 05/15/2014 1730 05/26/2014 2114  HGB 13.0  --   --   HCT 42.0  --   --   PLT 271  --   --   CREATININE  --  1.19  --   TROPONINI 0.40* 0.39* 0.45*   Estimated Creatinine Clearance: 60.8 mL/min (by C-G formula based on Cr of 1.19).  Medical History: Past Medical History  Diagnosis Date  . Diabetes mellitus   . Thyroid disease   . Hyperlipidemia   . Hypertension   . Insomnia   . Hypothyroidism   . Hypercholesteremia   . Depression   . Atrial fibrillation 05/25/2014   Assessment: 74yo male with elevated troponins.  Asked to initiate Heparin for ACS.    Goal of Therapy:  Heparin level 0.3-0.7 units/ml Monitor platelets by anticoagulation protocol: Yes   Plan:  Heparin 4000 units IV now x 1 Heparin infusion at 1200 units/hr Heparin level and CBC daily  [place] Crane, Kahne Helfand A 05/13/2014,11:56 PM

## 2014-05-14 NOTE — ED Notes (Signed)
Pt c/o nausea since Sunday.  Reports cough that started Saturday night.  Reports cough is worse at night when he lays down.  C/O SOB and pain in r knee.

## 2014-05-14 NOTE — H&P (Signed)
[facility] History and Physical  KALAB CAMPS QMG:[id number] DOB: 1940/03/26 DOA: 05/31/2014  Referring physician: ER PCP: Leonides Grills, MD   Chief Complaint: Dyspnea. Weakness.  HPI: Jeffrey [name] is a 74 y.o. male  This is a 74 year old man, diabetic, hypertensive who presents with 4-5 day history of dyspnea, especially on exertion. He has become more fatigued. He denies any chest pain, palpitations or limb weakness. He does not have a cardiac history. There is no fever or cough. Evaluation in the emergency room shows them to be in atrial fibrillation, ventricular rate is around 100. Also he has elevated troponin levels. He is now being admitted for further investigation and management.   Review of Systems:  Apart from symptoms above, all systems negative..    Past Medical History  Diagnosis Date  . Diabetes mellitus   . Thyroid disease   . Hyperlipidemia   . Hypertension   . Insomnia   . Hypothyroidism   . Hypercholesteremia   . Depression   . Atrial fibrillation 06/08/2014   Past Surgical History  Procedure Laterality Date  . Appendectomy    . Shoulder surgery    . Colonoscopy  04/30/2011    Procedure: COLONOSCOPY;  Surgeon: Danie Binder, MD;  [facility];  Service: Endoscopy;  Laterality: N/A;  8:30 AM   Social History:  reports that he quit smoking about 23 years ago. He has never used smokeless tobacco. He reports that he does not drink alcohol or use illicit drugs.  Allergies  Allergen Reactions  . Penicillins Rash    Family History  Problem Relation Age of Onset  . Cancer Sister   . Colon cancer Neg Hx     Prior to Admission medications   Medication Sig Start Date End Date Taking? Authorizing Provider  aspirin EC 81 MG tablet Take 81 mg by mouth daily.   Yes Historical Provider, MD  atorvastatin (LIPITOR) 20 MG tablet Take 1 tablet by mouth at bedtime.  04/01/14  Yes Historical Provider, MD  fish oil-omega-3 fatty acids 1000 MG  capsule Take 1,200 mg by mouth daily.   Yes Historical Provider, MD  glipiZIDE (GLUCOTROL) 5 MG tablet Take 1 tablet by mouth 2 (two) times daily. 05/02/14  Yes Historical Provider, MD  levothyroxine (SYNTHROID, LEVOTHROID) 50 MCG tablet Take 1 tablet by mouth daily. 03/01/14  Yes Historical Provider, MD  lisinopril (PRINIVIL,ZESTRIL) 20 MG tablet Take 20 mg by mouth at bedtime.    Yes Historical Provider, MD  metFORMIN (GLUCOPHAGE) 500 MG tablet Take 2 tablets by mouth 2 (two) times daily. 05/03/14  Yes Historical Provider, MD  Multiple Vitamin (MULTIVITAMIN) tablet Take 1 tablet by mouth daily.   Yes Historical Provider, MD  zolpidem (AMBIEN) 10 MG tablet Take 10 mg by mouth at bedtime as needed. For sleep    Yes Historical Provider, MD  levothyroxine (SYNTHROID, LEVOTHROID) 25 MCG tablet Take 50 mcg by mouth daily.     Historical Provider, MD   Physical Exam: Filed Vitals:   06/05/2014 1930 06/04/2014 2000 05/30/2014 2030 05/27/2014 2100  BP: 152/86 142/99 147/103 130/97  Pulse: 52 44 49 46  Temp:      TempSrc:      Resp: 17 20 26 31   Height:      Weight:      SpO2: 97% 98% 99% 98%    Wt Readings from Last 3 Encounters:  05/29/2014 86.183 kg (190 lb)  04/30/11 87.091 kg (192 lb)  08/18/10 90.266 kg (  199 lb)    General:  Appears calm and comfortable Eyes: PERRL, normal lids, irises & conjunctiva ENT: grossly normal hearing, lips & tongue Neck: no LAD, masses or thyromegaly Cardiovascular: Irregularly irregular, consistent with atrial fibrillation. No clinical signs of heart failure. Telemetry: Atrial fibrillation. Respiratory: CTA bilaterally, no w/r/r. Normal respiratory effort. Abdomen: soft, ntnd Skin: no rash or induration seen on limited exam Musculoskeletal: grossly normal tone BUE/BLE Psychiatric: grossly normal mood and affect, speech fluent and appropriate Neurologic: grossly non-focal.          Labs on Admission:  Basic Metabolic Panel:  Recent Labs Lab 06/06/2014 1730    NA 141  K 4.1  CL 111  CO2 21*  GLUCOSE 212*  BUN 33*  CREATININE 1.19  CALCIUM 8.7*  MG 1.5*   Liver Function Tests: No results for input(s): AST, ALT, ALKPHOS, BILITOT, PROT, ALBUMIN in the last 168 hours. No results for input(s): LIPASE, AMYLASE in the last 168 hours. No results for input(s): AMMONIA in the last 168 hours. CBC:  Recent Labs Lab 06/03/2014 1630  WBC 9.1  NEUTROABS 7.1  HGB 13.0  HCT 42.0  MCV 78.1  PLT 271   Cardiac Enzymes:  Recent Labs Lab 05/16/2014 1630 05/30/2014 1730  TROPONINI 0.40* 0.39*    BNP (last 3 results)  Recent Labs  05/26/2014 1630  BNP 885.0*    ProBNP (last 3 results) No results for input(s): PROBNP in the last 8760 hours.  CBG:  Recent Labs Lab 05/27/2014 1537  GLUCAP 276*    Radiological Exams on Admission: Dg Chest 2 View  06/11/2014   CLINICAL DATA:  Worsening shortness of breath, nonproductive cough  EXAM: CHEST  2 VIEW  COMPARISON:  None.  FINDINGS: Lungs are clear.  No pleural effusion or pneumothorax.  Cardiomegaly.  Degenerative changes of the visualized thoracolumbar spine.  IMPRESSION: No evidence of acute cardiopulmonary disease.   Electronically Signed   By: Julian Hy M.D.   On: 05/20/2014 16:45    EKG: Independently reviewed. Atrial fibrillation. No acute ST-T wave changes.  Assessment/Plan   1. Non-ST elevation MI. This may be related to ventricular rate from his atrial fibrillation We will cycle cardiac enzymes. He has been started on intravenous heparin. We will request cardiology consultation in the morning. We will obtain echocardiogram. 2. Atrial fibrillation. Start beta blocker. 3. Diabetes. Continue with home medications and sliding scale insulin. 4. Hypertension. Stable.  Further recommendations will depend on patient's hospital progress  Code Status: Full code  DVT Prophylaxis: IV heparin.  Family Communication: I discussed the plan with the patient at the bedside.   Disposition  Plan: Home when medically stable.   Time spent: 60 minutes  [facility] Pager [phone].

## 2014-05-14 NOTE — ED Provider Notes (Addendum)
CSN: [id number]     Arrival date & time 05/30/2014  1521 History   First MD Initiated Contact with Patient 06/05/2014 1551     Chief Complaint  Patient presents with  . Shortness of Breath  . Nausea     (Consider location/radiation/quality/duration/timing/severity/associated sxs/prior Treatment) HPI Comments: 74 year old male with history of diabetes, thyroid, hyperlipidemia, high blood pressure, depression presents with worsening shortness of breath, fatigue and nausea since Saturday. Patient also has a mild cough without fevers. Worse with activity and possibly with lying down. No recent surgeries, no blood clot history, no unilateral swelling, no active cancer. No cardiac history, no arrhythmia history. Family history of cardiac father. Past smoker. Rest improves his symptoms.  Patient is a 74 y.o. male presenting with shortness of breath. The history is provided by the patient.  Shortness of Breath Associated symptoms: cough   Associated symptoms: no abdominal pain, no chest pain, no fever, no headaches, no neck pain, no rash and no vomiting     Past Medical History  Diagnosis Date  . Diabetes mellitus   . Thyroid disease   . Hyperlipidemia   . Hypertension   . Insomnia   . Hypothyroidism   . Hypercholesteremia   . Depression    Past Surgical History  Procedure Laterality Date  . Appendectomy    . Shoulder surgery    . Colonoscopy  04/30/2011    Procedure: COLONOSCOPY;  Surgeon: Danie Binder, MD;  Location: [facility];  Service: Endoscopy;  Laterality: N/A;  8:30 AM   Family History  Problem Relation Age of Onset  . Cancer Sister   . Colon cancer Neg Hx    History  Substance Use Topics  . Smoking status: Former Smoker -- 0.50 packs/day    Quit date: 08/18/1990  . Smokeless tobacco: Never Used  . Alcohol Use: No    Review of Systems  Constitutional: Positive for fatigue. Negative for fever and chills.  HENT: Negative for congestion.   Eyes: Negative for visual  disturbance.  Respiratory: Positive for cough and shortness of breath.   Cardiovascular: Negative for chest pain.  Gastrointestinal: Negative for vomiting and abdominal pain.  Genitourinary: Negative for dysuria and flank pain.  Musculoskeletal: Negative for back pain, neck pain and neck stiffness.  Skin: Negative for rash.  Neurological: Negative for light-headedness and headaches.      Allergies  Penicillins  Home Medications   Prior to Admission medications   Medication Sig Start Date End Date Taking? Authorizing Provider  aspirin EC 81 MG tablet Take 81 mg by mouth daily.   Yes Historical Provider, MD  atorvastatin (LIPITOR) 20 MG tablet Take 1 tablet by mouth at bedtime.  04/01/14  Yes Historical Provider, MD  fish oil-omega-3 fatty acids 1000 MG capsule Take 1,200 mg by mouth daily.   Yes Historical Provider, MD  glipiZIDE (GLUCOTROL) 5 MG tablet Take 1 tablet by mouth 2 (two) times daily. 05/02/14  Yes Historical Provider, MD  levothyroxine (SYNTHROID, LEVOTHROID) 50 MCG tablet Take 1 tablet by mouth daily. 03/01/14  Yes Historical Provider, MD  lisinopril (PRINIVIL,ZESTRIL) 20 MG tablet Take 20 mg by mouth at bedtime.    Yes Historical Provider, MD  metFORMIN (GLUCOPHAGE) 500 MG tablet Take 2 tablets by mouth 2 (two) times daily. 05/03/14  Yes Historical Provider, MD  Multiple Vitamin (MULTIVITAMIN) tablet Take 1 tablet by mouth daily.   Yes Historical Provider, MD  zolpidem (AMBIEN) 10 MG tablet Take 10 mg by mouth at bedtime as  needed. For sleep    Yes Historical Provider, MD  levothyroxine (SYNTHROID, LEVOTHROID) 25 MCG tablet Take 50 mcg by mouth daily.     Historical Provider, MD   BP 138/102 mmHg  Pulse 92  Temp(Src) 97.4 F (36.3 C) (Oral)  Resp 19  Ht 5\' 8"  (1.727 m)  Wt 190 lb (86.183 kg)  BMI 28.90 kg/m2  SpO2 97% Physical Exam  Constitutional: He is oriented to person, place, and time. He appears well-developed and well-nourished.  HENT:  Head: Normocephalic  and atraumatic.  Mild dry mucous membranes  Eyes: Conjunctivae are normal. Right eye exhibits no discharge. Left eye exhibits no discharge.  Neck: Normal range of motion. Neck supple. No tracheal deviation present.  Cardiovascular: Normal rate.  An irregularly irregular rhythm present.  Pulmonary/Chest: Effort normal and breath sounds normal.  Abdominal: Soft. He exhibits no distension. There is no tenderness. There is no guarding.  Musculoskeletal: He exhibits no edema.  Neurological: He is alert and oriented to person, place, and time.  Skin: Skin is warm. No rash noted.  Psychiatric: He has a normal mood and affect.  Nursing note and vitals reviewed.   ED Course  Procedures (including critical care time) CRITICAL CARE Performed by: Mariea Clonts   Total critical care time: 35 min  Critical care time was exclusive of separately billable procedures and treating other patients.  Critical care was necessary to treat or prevent imminent or life-threatening deterioration.  Critical care was time spent personally by me on the following activities: development of treatment plan with patient and/or surrogate as well as nursing, discussions with consultants, evaluation of patient's response to treatment, examination of patient, obtaining history from patient or surrogate, ordering and performing treatments and interventions, ordering and review of laboratory studies, ordering and review of radiographic studies, pulse oximetry and re-evaluation of patient's condition.  Labs Review Labs Reviewed  CBC WITH DIFFERENTIAL/PLATELET - Abnormal; Notable for the following:    MCH 24.2 (*)    RDW 17.8 (*)    Neutrophils Relative % 78 (*)    All other components within normal limits  TROPONIN I - Abnormal; Notable for the following:    Troponin I 0.40 (*)    All other components within normal limits  TSH - Abnormal; Notable for the following:    TSH 6.794 (*)    All other components within  normal limits  BRAIN NATRIURETIC PEPTIDE - Abnormal; Notable for the following:    B Natriuretic Peptide 885.0 (*)    All other components within normal limits  BASIC METABOLIC PANEL - Abnormal; Notable for the following:    CO2 21 (*)    Glucose, Bld 212 (*)    BUN 33 (*)    Calcium 8.7 (*)    GFR calc non Af Amer 60 (*)    All other components within normal limits  MAGNESIUM - Abnormal; Notable for the following:    Magnesium 1.5 (*)    All other components within normal limits  TROPONIN I - Abnormal; Notable for the following:    Troponin I 0.39 (*)    All other components within normal limits  CBG MONITORING, ED - Abnormal; Notable for the following:    Glucose-Capillary 276 (*)    All other components within normal limits  CBG MONITORING, ED    Imaging Review Dg Chest 2 View  05/13/2014   CLINICAL DATA:  Worsening shortness of breath, nonproductive cough  EXAM: CHEST  2 VIEW  COMPARISON:  None.  FINDINGS: Lungs are clear.  No pleural effusion or pneumothorax.  Cardiomegaly.  Degenerative changes of the visualized thoracolumbar spine.  IMPRESSION: No evidence of acute cardiopulmonary disease.   Electronically Signed   By: Julian Hy M.D.   On: 06/06/2014 16:45     EKG Interpretation   Date/Time:  Tuesday May 14 2014 15:38:44 EDT Ventricular Rate:  109 PR Interval:    QRS Duration: 160 QT Interval:  346 QTC Calculation: 465 R Axis:   111 Text Interpretation:  Atrial fibrillation with rapid ventricular response  with premature ventricular or aberrantly conducted complexes Right bundle  branch block Septal infarct , age undetermined T wave abnormality,  consider inferolateral ischemia Abnormal ECG Confirmed by Dawnell Bryant  MD,  Shundra Wirsing ([id number]) on 05/30/2014 3:53:44 PM      MDM   Final diagnoses:  Atrial fibrillation, unspecified  Dyspnea  NSTEMI (non-ST elevated myocardial infarction)   Patient presents with general fatigue and shortness of breath since the weekend  this is new for patient. EKG reveals atrial fibrillation which is new for patient and right bundle branch block. Patient is symptomatic with it and patient lives alone. Plan for cardiac screen and observation telemetry in the hospital for further workup.   This patients CHA2DS2-VASc Score and unadjusted Ischemic Stroke Rate (% per year) is equal to 2.2 % stroke rate/year from a score of 2  Above score calculated as 1 point each if present [CHF, HTN, DM, Vascular=MI/PAD/Aortic Plaque, Age if 65-74, or Male] Above score calculated as 2 points each if present [Age > 75, or Stroke/TIA/TE]  Discussed with cardiology Dr Radford Pax, who felt if his troponin did not rise he was stable for staying at [facility], patient prefers to [facility] at [facility]. [facility] for telemetry admission, patient received aspirin, and recheck no chest pain, mild improvement. Chest x-ray reviewed no acute findings.  Dr Anastasio Champion rec stepdown and hep drip in ED.   The patients results and plan were reviewed and discussed.   Any x-rays performed were personally reviewed by myself.   Differential diagnosis were considered with the presenting HPI.  Medications  aspirin chewable tablet 324 mg (324 mg Oral Given 05/15/2014 1709)    Filed Vitals:   05/23/2014 1756 06/05/2014 1800 05/16/2014 1830 05/30/2014 1900  BP: 127/89 131/99 154/104 138/102  Pulse: 72 96 66 92  Temp: 97.4 F (36.3 C)     TempSrc: Oral     Resp: 18  25 19   Height:      Weight:      SpO2: 99% 97% 99% 97%    Final diagnoses:  Atrial fibrillation, unspecified  Dyspnea  NSTEMI (non-ST elevated myocardial infarction)    Admission/ observation were discussed with the admitting physician, patient and/or family and they are comfortable with the plan.      Elnora Morrison, MD 06/03/2014 2020  Elnora Morrison, MD 06/06/2014 2034

## 2014-05-15 ENCOUNTER — Inpatient Hospital Stay (HOSPITAL_COMMUNITY): Payer: Commercial Managed Care - HMO

## 2014-05-15 ENCOUNTER — Encounter (HOSPITAL_COMMUNITY): Payer: Self-pay | Admitting: Cardiology

## 2014-05-15 DIAGNOSIS — I4891 Unspecified atrial fibrillation: Secondary | ICD-10-CM

## 2014-05-15 DIAGNOSIS — I429 Cardiomyopathy, unspecified: Secondary | ICD-10-CM

## 2014-05-15 DIAGNOSIS — Z8249 Family history of ischemic heart disease and other diseases of the circulatory system: Secondary | ICD-10-CM

## 2014-05-15 DIAGNOSIS — I519 Heart disease, unspecified: Secondary | ICD-10-CM

## 2014-05-15 DIAGNOSIS — I509 Heart failure, unspecified: Secondary | ICD-10-CM

## 2014-05-15 DIAGNOSIS — E118 Type 2 diabetes mellitus with unspecified complications: Secondary | ICD-10-CM

## 2014-05-15 LAB — CBC
HCT: 40.5 % (ref 39.0–52.0)
Hemoglobin: 12.4 g/dL — ABNORMAL LOW (ref 13.0–17.0)
MCH: 23.9 pg — AB (ref 26.0–34.0)
MCHC: 30.6 g/dL (ref 30.0–36.0)
MCV: 78 fL (ref 78.0–100.0)
Platelets: 234 10*3/uL (ref 150–400)
RBC: 5.19 MIL/uL (ref 4.22–5.81)
RDW: 17.7 % — ABNORMAL HIGH (ref 11.5–15.5)
WBC: 9.7 10*3/uL (ref 4.0–10.5)

## 2014-05-15 LAB — COMPREHENSIVE METABOLIC PANEL
ALT: 23 U/L (ref 17–63)
ANION GAP: 7 (ref 5–15)
AST: 16 U/L (ref 15–41)
Albumin: 3.4 g/dL — ABNORMAL LOW (ref 3.5–5.0)
Alkaline Phosphatase: 88 U/L (ref 38–126)
BUN: 34 mg/dL — AB (ref 6–20)
CALCIUM: 8.5 mg/dL — AB (ref 8.9–10.3)
CO2: 21 mmol/L — AB (ref 22–32)
CREATININE: 1.22 mg/dL (ref 0.61–1.24)
Chloride: 112 mmol/L — ABNORMAL HIGH (ref 101–111)
GFR calc Af Amer: >60 mL/min (ref >60)
GFR calc non Af Amer: 58 mL/min — ABNORMAL LOW (ref >60)
Glucose, Bld: 150 mg/dL — ABNORMAL HIGH (ref 70–99)
Potassium: 3.8 mmol/L (ref 3.5–5.1)
SODIUM: 140 mmol/L (ref 135–145)
TOTAL PROTEIN: 6.3 g/dL — AB (ref 6.5–8.1)
Total Bilirubin: 0.9 mg/dL (ref 0.3–1.2)

## 2014-05-15 LAB — TROPONIN I
TROPONIN I: 0.35 ng/mL — AB (ref <0.031)
Troponin I: 0.37 ng/mL — ABNORMAL HIGH (ref <0.031)

## 2014-05-15 LAB — GLUCOSE, CAPILLARY
GLUCOSE-CAPILLARY: 183 mg/dL — AB (ref 70–99)
Glucose-Capillary: 147 mg/dL — ABNORMAL HIGH (ref 70–99)
Glucose-Capillary: 184 mg/dL — ABNORMAL HIGH (ref 70–99)
Glucose-Capillary: 168 mg/dL — ABNORMAL HIGH (ref 70–99)

## 2014-05-15 LAB — HEPARIN LEVEL (UNFRACTIONATED)
Heparin Unfractionated: 0.26 IU/mL — ABNORMAL LOW (ref 0.30–0.70)
Heparin Unfractionated: 0.54 IU/mL (ref 0.30–0.70)

## 2014-05-15 MED ORDER — AMIODARONE HCL IN DEXTROSE 360-4.14 MG/200ML-% IV SOLN
INTRAVENOUS | Status: AC
  Administered 2014-05-15: 200 mL
  Filled 2014-05-15: qty 200

## 2014-05-15 MED ORDER — FUROSEMIDE 10 MG/ML IJ SOLN
40.0000 mg | Freq: Every day | INTRAMUSCULAR | Status: DC
  Administered 2014-05-15: 40 mg via INTRAVENOUS
  Filled 2014-05-15: qty 4

## 2014-05-15 MED ORDER — AMIODARONE IV BOLUS ONLY 150 MG/100ML
INTRAVENOUS | Status: AC
  Administered 2014-05-15: 150 mg
  Filled 2014-05-15: qty 100

## 2014-05-15 MED ORDER — IOHEXOL 350 MG/ML SOLN
100.0000 mL | Freq: Once | INTRAVENOUS | Status: AC | PRN
  Administered 2014-05-15: 100 mL via INTRAVENOUS

## 2014-05-15 MED ORDER — POTASSIUM CHLORIDE CRYS ER 20 MEQ PO TBCR
20.0000 meq | EXTENDED_RELEASE_TABLET | Freq: Every day | ORAL | Status: DC
  Administered 2014-05-15: 20 meq via ORAL
  Filled 2014-05-15: qty 1

## 2014-05-15 MED ORDER — METOPROLOL SUCCINATE ER 25 MG PO TB24
25.0000 mg | ORAL_TABLET | Freq: Two times a day (BID) | ORAL | Status: DC
  Administered 2014-05-15 – 2014-05-16 (×4): 25 mg via ORAL
  Filled 2014-05-15 (×6): qty 1

## 2014-05-15 MED ORDER — AMIODARONE HCL IN DEXTROSE 360-4.14 MG/200ML-% IV SOLN
60.0000 mg/h | INTRAVENOUS | Status: DC
  Administered 2014-05-15: 60 mg/h via INTRAVENOUS
  Filled 2014-05-15: qty 200

## 2014-05-15 MED ORDER — SPIRONOLACTONE 12.5 MG HALF TABLET
12.5000 mg | ORAL_TABLET | Freq: Every day | ORAL | Status: DC
  Administered 2014-05-15 – 2014-05-16 (×2): 12.5 mg via ORAL
  Filled 2014-05-15 (×2): qty 1

## 2014-05-15 MED ORDER — AMIODARONE HCL IN DEXTROSE 360-4.14 MG/200ML-% IV SOLN
30.0000 mg/h | INTRAVENOUS | Status: DC
  Administered 2014-05-15: 30 mg/h via INTRAVENOUS

## 2014-05-15 NOTE — Progress Notes (Signed)
ANTICOAGULATION CONSULT NOTE - follow up  Pharmacy Consult for Heparin Indication: chest pain/ACS  Allergies  Allergen Reactions  . Penicillins Rash   Patient Measurements: Height: 5\' 8"  (172.7 cm) Weight: 200 lb 9.9 oz (91 kg) IBW/kg (Calculated) : 68.4  Vital Signs: Temp: 97.3 F (36.3 C) (05/04 0400) Temp Source: Oral (05/04 0400) BP: 137/89 mmHg (05/04 0600) Pulse Rate: 85 (05/04 0600)  Labs:  Recent Labs  06/03/2014 1630 06/03/2014 1730 05/20/2014 2114 05/15/14 0342 05/15/14 0400  HGB 13.0  --   --  12.4*  --   HCT 42.0  --   --  40.5  --   PLT 271  --   --  234  --   HEPARINUNFRC  --   --   --   --  0.26*  CREATININE  --  1.19  --  1.22  --   TROPONINI 0.40* 0.39* 0.45* 0.37*  --    Estimated Creatinine Clearance: 60.8 mL/min (by C-G formula based on Cr of 1.22).  Medical History: Past Medical History  Diagnosis Date  . Diabetes mellitus   . Thyroid disease   . Hyperlipidemia   . Hypertension   . Insomnia   . Hypothyroidism   . Hypercholesteremia   . Depression   . Atrial fibrillation 05/13/2014   Assessment: 73yo male with elevated troponins.  Asked to initiate Heparin for ACS.  Initial Heprin level is below goal.  Goal of Therapy:  Heparin level 0.3-0.7 units/ml Monitor platelets by anticoagulation protocol: Yes   Plan:   Increase Heparin infusion to 1400 units/hr  Heparin level today at 2pm  CBC and Heparin level daily while on Heparin  [place] Crane, Arnie Maiolo A 05/15/2014,7:53 AM

## 2014-05-15 NOTE — Progress Notes (Signed)
[facility] PROGRESS NOTE  Jeffrey [name] WUJ:[id number] DOB: [birth year] DOA: 05/22/2014 PCP: Leonides Grills, MD  Assessment/Plan: 1. Atrial fibrillation. CHADSVASC 3. Appears to be rate controlled. Started on anticoagulation with intravenous heparin. Echocardiogram pending. 2. Non-ST elevation MI. Mild elevation of troponin may be related to #1. Does not appear to have evidence of CHF at this time. Chest x-ray did not show any evidence of edema. Cardiology has been consulted. We'll also check CT angiogram chest to rule out pulmonary embolus. 3. Diabetes. Hold metformin since patient will be receiving contrast. Continue sliding scale insulin and glipizide. 4. Hypertension. Stable  Code Status: full code Family Communication: discussed with patient and family at the bedside Disposition Plan: discharge home once improved   Consultants:  cardiology  Procedures:    Antibiotics:    HPI/Subjective: Still feels short of breath, he does report a cough, reports shortness of breath began suddenly  Objective: Filed Vitals:   05/15/14 0700  BP: 135/97  Pulse: 83  Temp:   Resp: 25    Intake/Output Summary (Last 24 hours) at 05/15/14 1002 Last data filed at 05/15/14 0300  Gross per 24 hour  Intake   61.6 ml  Output      0 ml  Net   61.6 ml   Filed Weights   06/03/2014 1529 05/15/14 0500  Weight: 86.183 kg (190 lb) 91 kg (200 lb 9.9 oz)    Exam:   General:  NAD  Cardiovascular: S1, S2 irregular  Respiratory: CTA B  Abdomen: soft, nt, nd, bs+  Musculoskeletal: no edema  Data Reviewed: Basic Metabolic Panel:  Recent Labs Lab 06/02/2014 1730 05/15/14 0342  NA 141 140  K 4.1 3.8  CL 111 112*  CO2 21* 21*  GLUCOSE 212* 150*  BUN 33* 34*  CREATININE 1.19 1.22  CALCIUM 8.7* 8.5*  MG 1.5*  --    Liver Function Tests:  Recent Labs Lab 05/15/14 0342  AST 16  ALT 23  ALKPHOS 88  BILITOT 0.9  PROT 6.3*  ALBUMIN 3.4*   No results for input(s):  LIPASE, AMYLASE in the last 168 hours. No results for input(s): AMMONIA in the last 168 hours. CBC:  Recent Labs Lab 05/22/2014 1630 05/15/14 0342  WBC 9.1 9.7  NEUTROABS 7.1  --   HGB 13.0 12.4*  HCT 42.0 40.5  MCV 78.1 78.0  PLT 271 234   Cardiac Enzymes:  Recent Labs Lab 05/12/2014 1630 06/10/2014 1730 05/13/2014 2114 05/15/14 0342  TROPONINI 0.40* 0.39* 0.45* 0.37*   BNP (last 3 results)  Recent Labs  05/23/2014 1630  BNP 885.0*    ProBNP (last 3 results) No results for input(s): PROBNP in the last 8760 hours.  CBG:  Recent Labs Lab 06/05/2014 1537 06/11/2014 2217 05/15/14 0744  GLUCAP 276* 139* 147*    Recent Results (from the past 240 hour(s))  MRSA PCR Screening     Status: None   Collection Time: 06/07/2014  9:36 PM  Result Value Ref Range Status   MRSA by PCR NEGATIVE NEGATIVE Final    Comment:        The GeneXpert MRSA Assay (FDA approved for NASAL specimens only), is one component of a comprehensive MRSA colonization surveillance program. It is not intended to diagnose MRSA infection nor to guide or monitor treatment for MRSA infections.      Studies: Dg Chest 2 View  05/29/2014   CLINICAL DATA:  Worsening shortness of breath, nonproductive cough  EXAM: CHEST  2 VIEW  COMPARISON:  None.  FINDINGS: Lungs are clear.  No pleural effusion or pneumothorax.  Cardiomegaly.  Degenerative changes of the visualized thoracolumbar spine.  IMPRESSION: No evidence of acute cardiopulmonary disease.   Electronically Signed   By: Julian Hy M.D.   On: 05/18/2014 16:45    Scheduled Meds: . aspirin EC  81 mg Oral Daily  . atorvastatin  20 mg Oral QHS  . glipiZIDE  5 mg Oral BID AC  . insulin aspart  0-20 Units Subcutaneous TID WC  . insulin aspart  0-5 Units Subcutaneous QHS  . levothyroxine  50 mcg Oral QAC breakfast  . lisinopril  20 mg Oral QHS  . metoprolol tartrate  25 mg Oral BID  . multivitamin with minerals  1 tablet Oral Daily  . nitroGLYCERIN  1  inch Topical 4 times per day  . omega-3 acid ethyl esters  1,000 mg Oral Daily  . sodium chloride  3 mL Intravenous Q12H   Continuous Infusions: . heparin 1,400 Units/hr (05/15/14 0804)    Active Problems:   NSTEMI (non-ST elevated myocardial infarction)   Atrial fibrillation   Diabetes   Hypertension   Non-ST elevation MI (NSTEMI)    Time spent: 65mins    Jeffrey [name],Jeffrey [name]  [facility] Pager [phone]. If 7PM-7AM, please contact night-coverage at [email or web address], password [facility] 05/15/2014, 10:02 AM  LOS: 1 day

## 2014-05-15 NOTE — Progress Notes (Signed)
At 1659 patient noted to have a 41 beat of V-Tach. Dr. Bronson Ing notified and orders given to give Toprol XL 25 mg PO now instead of 2200 scheduled dose tonight. Patient and family updated. Will continue to monitor.

## 2014-05-15 NOTE — Progress Notes (Signed)
ANTICOAGULATION CONSULT NOTE - follow up  Pharmacy Consult for Heparin Indication: chest pain/ACS  Allergies  Allergen Reactions  . Penicillins Rash   Patient Measurements: Height: 5\' 8"  (172.7 cm) Weight: 200 lb 9.9 oz (91 kg) IBW/kg (Calculated) : 68.4  Vital Signs: Temp: 97.4 F (36.3 C) (05/04 1200) Temp Source: Axillary (05/04 1200) BP: 135/97 mmHg (05/04 0700) Pulse Rate: 83 (05/04 0700)  Labs:  Recent Labs  06/06/2014 1630 05/17/2014 1730 06/06/2014 2114 05/15/14 0342 05/15/14 0400 05/15/14 0900 05/15/14 1403  HGB 13.0  --   --  12.4*  --   --   --   HCT 42.0  --   --  40.5  --   --   --   PLT 271  --   --  234  --   --   --   HEPARINUNFRC  --   --   --   --  0.26*  --  0.54  CREATININE  --  1.19  --  1.22  --   --   --   TROPONINI 0.40* 0.39* 0.45* 0.37*  --  0.35*  --    Estimated Creatinine Clearance: 60.8 mL/min (by C-G formula based on Cr of 1.22).  Medical History: Past Medical History  Diagnosis Date  . Type 2 diabetes mellitus   . Hypothyroidism   . Hyperlipidemia   . Essential hypertension   . Insomnia   . Depression   . Atrial fibrillation     Diagnosed May 2016   Assessment: 74yo male with elevated troponins.  Asked to initiate Heparin for ACS.   Heparin level is therapeutic after rate increase.  Goal of Therapy:  Heparin level 0.3-0.7 units/ml Monitor platelets by anticoagulation protocol: Yes   Plan:   continue Heparin infusion at 1400 units/hr  CBC and Heparin level daily while on Heparin  [place] Crane, Giovanny Dugal A 05/15/2014,3:15 PM

## 2014-05-15 NOTE — Care Management Note (Signed)
Case Management Note  Patient Details  Name: WINIFRED BALOGH MRN: [id number] Date of Birth: 06/14/1940   Expected Discharge Date:  05/17/14               Expected Discharge Plan:  Home/Self Care  In-House Referral:  NA  Discharge planning Services  CM Consult  Post Acute Care Choice:  NA Choice offered to:  NA  DME Arranged:    DME Agency:     HH Arranged:    HH Agency:     Status of Service:  In process, will continue to follow  Medicare Important Message Given:    Date Medicare IM Given:    Medicare IM give by:    Date Additional Medicare IM Given:    Additional Medicare Important Message give by:     If discussed at [place] of Stay Meetings, dates discussed:    Additional Comments: Pt is from home, lives alone. Pt independent at baseline. Pt has no HH services or DME's prior to admission. Pt plans to DC home with self care. ? Need for PT eval and home O2 eval prior to DC. Will cont to follow.  Sherald Barge, RN 05/15/2014, 3:12 PM

## 2014-05-15 NOTE — Consult Note (Signed)
Reason for Consult: Atrial fibrillation Referring Physician: PTH Primary care: Dr. Hilma Favors Cardiologist: new Kate Sable, MD  Jeffrey [name] is an 74 y.o. male.  HPI: This is a 74 year old male patient with no prior cardiac history. He has hypertension, diabetes and hyperlipidemia. He presented to the emergency room with 4-5 day history of worsening dyspnea on exertion. He was found to be in atrial fibrillation with a rate of 109. His troponins are elevated at 0.35, 0.37, 0.45, 0.39, and 0.40. BNP was elevated at 845, TSH 6.794, magnesium 1.5. EKG atrial fibrillation at 109 bpm with right bundle branch block and PVCs. CT angio of  the chest showed no evidence of PE. There was generalized volume overload with moderate right pleural effusion, small volume the upper abdominal ascites and body wall edema. There was a small focus of irregular density in the left ventricular lumen that may be artifactual given motion through this region or represent a small amount of IV contrast. 2-D echo recommended to exclude thrombus or other lesion.   Preliminary 2-D echo EF is 25% with diffuse hypokinesis.  Patient describes feeling like he had a cold 2 weeks ago that he couldn't get over. He had worsening dyspnea on exertion and had to stop doing his regular chores around the house to get his breath. He also complained of a cough when he lay down at night. He denies any chest pain, palpitations, dyspnea at rest, dizziness, or presyncope. He cannot tell that he is in atrial fibrillation. He quit smoking 25 years ago. He has a strong family history of CAD with his father dying of an MI at 39, sister dying at 90 of an MI and another sister dying at 72 of an MI who also had atrial fibrillation.  Past Medical History  Diagnosis Date  . Type 2 diabetes mellitus   . Hypothyroidism   . Hyperlipidemia   . Essential hypertension   . Insomnia   . Depression   . Atrial fibrillation     Diagnosed May 2016    Past  Surgical History  Procedure Laterality Date  . Appendectomy    . Shoulder surgery    . Colonoscopy  04/30/2011    Procedure: COLONOSCOPY;  Surgeon: Danie Binder, MD;  Location: [facility];  Service: Endoscopy;  Laterality: N/A;  8:30 AM    Family History  Problem Relation Age of Onset  . Cancer Sister   . Colon cancer Neg Hx     Social History:  reports that he quit smoking about 23 years ago. His smoking use included Cigarettes. He smoked 0.50 packs per day. He has never used smokeless tobacco. He reports that he does not drink alcohol or use illicit drugs.  Allergies:  Allergies  Allergen Reactions  . Penicillins Rash    Medications: Scheduled Meds: . aspirin EC  81 mg Oral Daily  . atorvastatin  20 mg Oral QHS  . glipiZIDE  5 mg Oral BID AC  . insulin aspart  0-20 Units Subcutaneous TID WC  . insulin aspart  0-5 Units Subcutaneous QHS  . levothyroxine  50 mcg Oral QAC breakfast  . lisinopril  20 mg Oral QHS  . metoprolol tartrate  25 mg Oral BID  . multivitamin with minerals  1 tablet Oral Daily  . nitroGLYCERIN  1 inch Topical 4 times per day  . omega-3 acid ethyl esters  1,000 mg Oral Daily  . sodium chloride  3 mL Intravenous Q12H   Continuous Infusions: .  heparin 1,400 Units/hr (05/15/14 0804)   PRN Meds:.morphine injection, ondansetron **OR** ondansetron (ZOFRAN) IV, zolpidem   Results for orders placed or performed during the hospital encounter of 05/27/2014 (from the past 48 hour(s))  CBG monitoring, ED     Status: Abnormal   Collection Time: 05/13/2014  3:37 PM  Result Value Ref Range   Glucose-Capillary 276 (H) 70 - 99 mg/dL  CBC with Differential     Status: Abnormal   Collection Time: 05/25/2014  4:30 PM  Result Value Ref Range   WBC 9.1 4.0 - 10.5 K/uL   RBC 5.38 4.22 - 5.81 MIL/uL   Hemoglobin 13.0 13.0 - 17.0 g/dL   HCT 42.0 39.0 - 52.0 %   MCV 78.1 78.0 - 100.0 fL   MCH 24.2 (L) 26.0 - 34.0 pg   MCHC 31.0 30.0 - 36.0 g/dL   RDW 17.8 (H) 11.5 -  15.5 %   Platelets 271 150 - 400 K/uL   Neutrophils Relative % 78 (H) 43 - 77 %   Lymphocytes Relative 13 12 - 46 %   Monocytes Relative 6 3 - 12 %   Eosinophils Relative 2 0 - 5 %   Basophils Relative 1 0 - 1 %   Neutro Abs 7.1 1.7 - 7.7 K/uL   Lymphs Abs 1.2 0.7 - 4.0 K/uL   Monocytes Absolute 0.5 0.1 - 1.0 K/uL   Eosinophils Absolute 0.2 0.0 - 0.7 K/uL   Basophils Absolute 0.1 0.0 - 0.1 K/uL   RBC Morphology POLYCHROMASIA PRESENT     Comment: SCHISTOCYTES PRESENT (2-5/hpf) ELLIPTOCYTES    Smear Review PLATELET COUNT CONFIRMED BY SMEAR     Comment: LARGE PLATELETS PRESENT GIANT PLATELETS SEEN   Troponin I     Status: Abnormal   Collection Time: 05/24/2014  4:30 PM  Result Value Ref Range   Troponin I 0.40 (H) <0.031 ng/mL    Comment:        PERSISTENTLY INCREASED TROPONIN VALUES IN THE RANGE OF 0.04-0.49 ng/mL CAN BE SEEN IN:       -UNSTABLE ANGINA       -CONGESTIVE HEART FAILURE       -MYOCARDITIS       -CHEST TRAUMA       -ARRYHTHMIAS       -LATE PRESENTING MYOCARDIAL INFARCTION       -COPD   CLINICAL FOLLOW-UP RECOMMENDED.   TSH     Status: Abnormal   Collection Time: 05/19/2014  4:30 PM  Result Value Ref Range   TSH 6.794 (H) 0.350 - 4.500 uIU/mL  Brain natriuretic peptide     Status: Abnormal   Collection Time: 06/11/2014  4:30 PM  Result Value Ref Range   B Natriuretic Peptide 885.0 (H) 0.0 - 100.0 pg/mL  Basic metabolic panel     Status: Abnormal   Collection Time: 06/04/2014  5:30 PM  Result Value Ref Range   Sodium 141 135 - 145 mmol/L   Potassium 4.1 3.5 - 5.1 mmol/L   Chloride 111 101 - 111 mmol/L   CO2 21 (L) 22 - 32 mmol/L   Glucose, Bld 212 (H) 70 - 99 mg/dL   BUN 33 (H) 6 - 20 mg/dL   Creatinine, Ser 1.19 0.61 - 1.24 mg/dL   Calcium 8.7 (L) 8.9 - 10.3 mg/dL   GFR calc non Af Amer 60 (L) >60 mL/min   GFR calc Af Amer >60 >60 mL/min    Comment: (NOTE) The eGFR has been calculated using the  CKD EPI equation. This calculation has not been validated in  all clinical situations. eGFR's persistently <90 mL/min signify possible Chronic Kidney Disease.    Anion gap 9 5 - 15  Magnesium     Status: Abnormal   Collection Time: 05/19/2014  5:30 PM  Result Value Ref Range   Magnesium 1.5 (L) 1.7 - 2.4 mg/dL  Troponin I     Status: Abnormal   Collection Time: 05/13/2014  5:30 PM  Result Value Ref Range   Troponin I 0.39 (H) <0.031 ng/mL    Comment:        PERSISTENTLY INCREASED TROPONIN VALUES IN THE RANGE OF 0.04-0.49 ng/mL CAN BE SEEN IN:       -UNSTABLE ANGINA       -CONGESTIVE HEART FAILURE       -MYOCARDITIS       -CHEST TRAUMA       -ARRYHTHMIAS       -LATE PRESENTING MYOCARDIAL INFARCTION       -COPD   CLINICAL FOLLOW-UP RECOMMENDED.   Troponin I     Status: Abnormal   Collection Time: 06/07/2014  9:14 PM  Result Value Ref Range   Troponin I 0.45 (H) <0.031 ng/mL    Comment:        PERSISTENTLY INCREASED TROPONIN VALUES IN THE RANGE OF 0.04-0.49 ng/mL CAN BE SEEN IN:       -UNSTABLE ANGINA       -CONGESTIVE HEART FAILURE       -MYOCARDITIS       -CHEST TRAUMA       -ARRYHTHMIAS       -LATE PRESENTING MYOCARDIAL INFARCTION       -COPD   CLINICAL FOLLOW-UP RECOMMENDED.   MRSA PCR Screening     Status: None   Collection Time: 06/02/2014  9:36 PM  Result Value Ref Range   MRSA by PCR NEGATIVE NEGATIVE    Comment:        The GeneXpert MRSA Assay (FDA approved for NASAL specimens only), is one component of a comprehensive MRSA colonization surveillance program. It is not intended to diagnose MRSA infection nor to guide or monitor treatment for MRSA infections.   Glucose, capillary     Status: Abnormal   Collection Time: 06/11/2014 10:17 PM  Result Value Ref Range   Glucose-Capillary 139 (H) 70 - 99 mg/dL  Troponin I     Status: Abnormal   Collection Time: 05/15/14  3:42 AM  Result Value Ref Range   Troponin I 0.37 (H) <0.031 ng/mL    Comment:        PERSISTENTLY INCREASED TROPONIN VALUES IN THE RANGE OF  0.04-0.49 ng/mL CAN BE SEEN IN:       -UNSTABLE ANGINA       -CONGESTIVE HEART FAILURE       -MYOCARDITIS       -CHEST TRAUMA       -ARRYHTHMIAS       -LATE PRESENTING MYOCARDIAL INFARCTION       -COPD   CLINICAL FOLLOW-UP RECOMMENDED.   CBC     Status: Abnormal   Collection Time: 05/15/14  3:42 AM  Result Value Ref Range   WBC 9.7 4.0 - 10.5 K/uL   RBC 5.19 4.22 - 5.81 MIL/uL   Hemoglobin 12.4 (L) 13.0 - 17.0 g/dL   HCT 40.5 39.0 - 52.0 %   MCV 78.0 78.0 - 100.0 fL   MCH 23.9 (L) 26.0 - 34.0 pg   MCHC 30.6 30.0 -  36.0 g/dL   RDW 17.7 (H) 11.5 - 15.5 %   Platelets 234 150 - 400 K/uL  Comprehensive metabolic panel     Status: Abnormal   Collection Time: 05/15/14  3:42 AM  Result Value Ref Range   Sodium 140 135 - 145 mmol/L   Potassium 3.8 3.5 - 5.1 mmol/L   Chloride 112 (H) 101 - 111 mmol/L   CO2 21 (L) 22 - 32 mmol/L   Glucose, Bld 150 (H) 70 - 99 mg/dL   BUN 34 (H) 6 - 20 mg/dL   Creatinine, Ser 1.22 0.61 - 1.24 mg/dL   Calcium 8.5 (L) 8.9 - 10.3 mg/dL   Total Protein 6.3 (L) 6.5 - 8.1 g/dL   Albumin 3.4 (L) 3.5 - 5.0 g/dL   AST 16 15 - 41 U/L   ALT 23 17 - 63 U/L   Alkaline Phosphatase 88 38 - 126 U/L   Total Bilirubin 0.9 0.3 - 1.2 mg/dL   GFR calc non Af Amer 58 (L) >60 mL/min   GFR calc Af Amer >60 >60 mL/min    Comment: (NOTE) The eGFR has been calculated using the CKD EPI equation. This calculation has not been validated in all clinical situations. eGFR's persistently <90 mL/min signify possible Chronic Kidney Disease.    Anion gap 7 5 - 15  Heparin level (unfractionated)     Status: Abnormal   Collection Time: 05/15/14  4:00 AM  Result Value Ref Range   Heparin Unfractionated 0.26 (L) 0.30 - 0.70 IU/mL    Comment:        IF HEPARIN RESULTS ARE BELOW EXPECTED VALUES, AND PATIENT DOSAGE HAS BEEN CONFIRMED, SUGGEST FOLLOW UP TESTING OF ANTITHROMBIN III LEVELS.   Glucose, capillary     Status: Abnormal   Collection Time: 05/15/14  7:44 AM  Result  Value Ref Range   Glucose-Capillary 147 (H) 70 - 99 mg/dL   Comment 1 Notify RN   Troponin I     Status: Abnormal   Collection Time: 05/15/14  9:00 AM  Result Value Ref Range   Troponin I 0.35 (H) <0.031 ng/mL    Comment:        PERSISTENTLY INCREASED TROPONIN VALUES IN THE RANGE OF 0.04-0.49 ng/mL CAN BE SEEN IN:       -UNSTABLE ANGINA       -CONGESTIVE HEART FAILURE       -MYOCARDITIS       -CHEST TRAUMA       -ARRYHTHMIAS       -LATE PRESENTING MYOCARDIAL INFARCTION       -COPD   CLINICAL FOLLOW-UP RECOMMENDED.   Glucose, capillary     Status: Abnormal   Collection Time: 05/15/14 11:34 AM  Result Value Ref Range   Glucose-Capillary 183 (H) 70 - 99 mg/dL   Comment 1 Notify RN     Dg Chest 2 View  05/28/2014   CLINICAL DATA:  Worsening shortness of breath, nonproductive cough  EXAM: CHEST  2 VIEW  COMPARISON:  None.  FINDINGS: Lungs are clear.  No pleural effusion or pneumothorax.  Cardiomegaly.  Degenerative changes of the visualized thoracolumbar spine.  IMPRESSION: No evidence of acute cardiopulmonary disease.   Electronically Signed   By: Julian Hy M.D.   On: 05/18/2014 16:45   Ct Angio Chest Pe W/cm &/or Wo Cm  05/15/2014   CLINICAL DATA:  Shortness of breath for 4-5 days. Increased fatigue. Atrial fibrillation.  EXAM: CT ANGIOGRAPHY CHEST WITH CONTRAST  TECHNIQUE: Multidetector  CT imaging of the chest was performed using the standard protocol during bolus administration of intravenous contrast. Multiplanar CT image reconstructions and MIPs were obtained to evaluate the vascular anatomy.  CONTRAST:  167m OMNIPAQUE IOHEXOL 350 MG/ML SOLN  COMPARISON:  Chest radiographs 06/05/2014  FINDINGS: No lobar or more central pulmonary embolus is identified. Segmental and subsegmental branches are suboptimally evaluated to varying degrees due to motion artifact and suboptimal contrast opacification.  Subcentimeter mediastinal lymph nodes are likely reactive. Three-vessel coronary  artery calcification is present. There is a 2 cm focus of irregular density in the lumen of the left ventricle, with a band of motion artifact extending through this level. There is reflux of contrast into the IVC and hepatic veins which could be related to injection technique or a component of right heart failure.  There is a moderate right pleural effusion. Evaluation of the lung parenchyma is limited by respiratory motion artifact. Compressive atelectasis is present in the right lower lobe. Mild perihilar ground-glass opacities versus motion artifact.  A small amount of free fluid is partially visualized in the upper abdomen. There is body wall edema. Bridging osteophytosis is present throughout the thoracic spine.  Review of the MIP images confirms the above findings.  IMPRESSION: 1. No evidence of central pulmonary emboli. Suboptimal evaluation of segmental and subsegmental branches due to motion artifact. 2. Evidence of generalized volume overload with moderate right pleural effusion, small volume upper abdominal ascites, and body wall edema. 3. Suboptimal evaluation of the lung parenchyma due to motion artifact. Very mild pulmonary edema not excluded. 4. Small focus of irregular density in the left ventricular lumen. This may be artifactual given motion through this region or represent a small amount of IV contrast. Correlate with pending echocardiogram to exclude thrombus or other lesion.   Electronically Signed   By: ALogan Bores  On: 05/15/2014 11:43    ROS  See HPI Eyes: Negative Ears: Positive for hearing loss, negative tinnitus Cardiovascular: Negative for chest pain, palpitations,irregular heartbeat, dyspnea,  near-syncope, orthopnea, paroxysmal nocturnal dyspnea and syncope,edema, claudication, cyanosis,.  Respiratory:   Positive for cough, negative hemoptysis,  sputum production and wheezing.   Endocrine: Negative for cold intolerance and heat intolerance.  Hematologic/Lymphatic: Negative  for adenopathy and bleeding problem. Does not bruise/bleed easily.  Musculoskeletal: Negative.   Gastrointestinal: Negative for nausea, vomiting, reflux, abdominal pain, diarrhea, constipation.   Genitourinary: Negative for bladder incontinence, dysuria, flank pain, frequency, hematuria, hesitancy, nocturia and urgency.  Neurological: Negative.  Allergic/Immunologic: Negative for environmental allergies.  Blood pressure 135/97, pulse 83, temperature 97.4 F (36.3 C), temperature source Axillary, resp. rate 25, height _0  (1.727 m), weight 200 lb 9.9 oz (91 kg), SpO2 100 %. Physical Exam PHYSICAL EXAM: Well-nournished, in no acute distress. Neck: Slight increase JVD, HJR, no Bruit, or thyroid enlargement Lungs: Decreased breath sounds at bases without significant rails  Cardiovascular: Irregular irregular, PMI not displaced, 1 to 2/6 systolic murmur at the apex, no gallops, bruit, thrill, or heave. Abdomen: Distended, BS normal. Soft without organomegaly, masses, lesions or tenderness. Extremities: without cyanosis, clubbing or edema. Good distal pulses bilateral SKin: Warm, no lesions or rashes  Musculoskeletal: No deformities Neuro: no focal signs   Assessment/Plan: Atrial fibrillation with RVR : Rate is currently controlled on metoprolol. Chadvasc score=4. Will need anticoagulation  Systolic heart failure: EF 25% new for patient will need ischemia workup, also need to consider viral cardiomyopathy. We'll give dose of IV Lasix  NSTEMI: Elevated troponins but they're flat.  Could be due to stress ischemia with CHF and atrial fibrillation although he has new LV dysfunction and will have to be ruled out for CAD.  LV dysfunction EF 25% on 2-D echo: Will need ischemia workup with cardiac catheterization.  Hypertension controlled  Diabetes mellitus  Hypothyroidism TSH elevated  Former tobacco abuse  Family history of CAD       Ermalinda Barrios 05/15/2014, 1:09 PM   The patient  was seen and examined, and I agree with the assessment and plan as documented above, with modifications as noted below. Pt with aforementioned history presenting with orthopnea and exertional dyspnea and found to be in atrial fibrillation with minimal troponin elevation.  Had one episode of precordial pain in the ED but none prior nor since, alleviated with nitroglycerin. Has a strong family h/o premature CAD. CT angiogram of chest demonstrated generalized volume overload with moderate size right pleural effusion, ascites, and body wall edema. Echocardiogram demonstrated severe biventricular systolic dysfunction, EF 52-58%, with mild to moderate mitral and tricuspid regurgitation and moderately elevated pulmonary pressures, with severe left atrial enlargement.  Given the absence of palpitations and severe left atrial enlargement, atrial fibrillation may be long standing. CHADSVASC 4 thus anticoagulation is indicated to reduce thromboembolic risk. While troponins are minimally elevated and may be more indicative of demand ischemia, given severe LV dysfunction and strong family h/o CAD, will need ischemic evaluation to rule out CAD (cardiac catheterization). Will switch metoprolol tartrate to succinate 25 mg bid. On ASA, Lipitor, and lisinopril. Will diurese with IV Lasix. Will add low-dose spironolactone and would consider digoxin in the future. Consider transfer to [facility] on 5/5 for coronary angiography.

## 2014-05-16 ENCOUNTER — Inpatient Hospital Stay (HOSPITAL_COMMUNITY): Payer: Commercial Managed Care - HMO

## 2014-05-16 ENCOUNTER — Encounter (HOSPITAL_COMMUNITY)
Admission: EM | Disposition: E | Payer: Commercial Managed Care - HMO | Source: Home / Self Care | Attending: Cardiothoracic Surgery

## 2014-05-16 DIAGNOSIS — D509 Iron deficiency anemia, unspecified: Secondary | ICD-10-CM

## 2014-05-16 DIAGNOSIS — I472 Ventricular tachycardia: Secondary | ICD-10-CM

## 2014-05-16 DIAGNOSIS — I4901 Ventricular fibrillation: Secondary | ICD-10-CM

## 2014-05-16 DIAGNOSIS — I4729 Other ventricular tachycardia: Secondary | ICD-10-CM

## 2014-05-16 DIAGNOSIS — I2510 Atherosclerotic heart disease of native coronary artery without angina pectoris: Secondary | ICD-10-CM

## 2014-05-16 DIAGNOSIS — I5021 Acute systolic (congestive) heart failure: Secondary | ICD-10-CM

## 2014-05-16 DIAGNOSIS — I4891 Unspecified atrial fibrillation: Secondary | ICD-10-CM | POA: Insufficient documentation

## 2014-05-16 DIAGNOSIS — E039 Hypothyroidism, unspecified: Secondary | ICD-10-CM

## 2014-05-16 HISTORY — PX: RIGHT HEART CATHETERIZATION: SHX5447

## 2014-05-16 HISTORY — PX: CARDIAC CATHETERIZATION: SHX172

## 2014-05-16 LAB — POCT I-STAT 3, ART BLOOD GAS (G3+)
Acid-base deficit: 7 mmol/L — ABNORMAL HIGH (ref 0.0–2.0)
Bicarbonate: 18.8 mEq/L — ABNORMAL LOW (ref 20.0–24.0)
O2 SAT: 92 %
PCO2 ART: 38.9 mmHg (ref 35.0–45.0)
PH ART: 7.293 — AB (ref 7.350–7.450)
PO2 ART: 72 mmHg — AB (ref 80.0–100.0)
TCO2: 20 mmol/L (ref 0–100)

## 2014-05-16 LAB — POCT I-STAT 3, VENOUS BLOOD GAS (G3P V)
ACID-BASE DEFICIT: 6 mmol/L — AB (ref 0.0–2.0)
ACID-BASE DEFICIT: 6 mmol/L — AB (ref 0.0–2.0)
Acid-base deficit: 7 mmol/L — ABNORMAL HIGH (ref 0.0–2.0)
BICARBONATE: 20.4 meq/L (ref 20.0–24.0)
Bicarbonate: 19.3 mEq/L — ABNORMAL LOW (ref 20.0–24.0)
Bicarbonate: 19.7 mEq/L — ABNORMAL LOW (ref 20.0–24.0)
O2 SAT: 47 %
O2 Saturation: 33 %
O2 Saturation: 40 %
PCO2 VEN: 39.5 mmHg — AB (ref 45.0–50.0)
PH VEN: 7.302 — AB (ref 7.250–7.300)
PO2 VEN: 28 mmHg — AB (ref 30.0–45.0)
TCO2: 20 mmol/L (ref 0–100)
TCO2: 22 mmol/L (ref 0–100)
TCO2: 21 mmol/L (ref 0–100)
pCO2, Ven: 39.7 mmHg — ABNORMAL LOW (ref 45.0–50.0)
pCO2, Ven: 41.9 mmHg — ABNORMAL LOW (ref 45.0–50.0)
pH, Ven: 7.295 (ref 7.250–7.300)
pH, Ven: 7.296 (ref 7.250–7.300)
pO2, Ven: 22 mmHg — CL (ref 30.0–45.0)
pO2, Ven: 26 mmHg — CL (ref 30.0–45.0)

## 2014-05-16 LAB — CBC
HCT: 42.3 % (ref 39.0–52.0)
HCT: 42.3 % (ref 39.0–52.0)
HEMOGLOBIN: 12.7 g/dL — AB (ref 13.0–17.0)
Hemoglobin: 13.6 g/dL (ref 13.0–17.0)
MCH: 23.3 pg — AB (ref 26.0–34.0)
MCH: 24.4 pg — AB (ref 26.0–34.0)
MCHC: 30 g/dL (ref 30.0–36.0)
MCHC: 32.2 g/dL (ref 30.0–36.0)
MCV: 77.6 fL — AB (ref 78.0–100.0)
MCV: 75.9 fL — AB (ref 78.0–100.0)
PLATELETS: 263 10*3/uL (ref 150–400)
Platelets: 188 10*3/uL (ref 150–400)
RBC: 5.45 MIL/uL (ref 4.22–5.81)
RBC: 5.57 MIL/uL (ref 4.22–5.81)
RDW: 18 % — ABNORMAL HIGH (ref 11.5–15.5)
RDW: 17.6 % — ABNORMAL HIGH (ref 11.5–15.5)
WBC: 11.1 10*3/uL — ABNORMAL HIGH (ref 4.0–10.5)
WBC: 4.7 10*3/uL (ref 4.0–10.5)

## 2014-05-16 LAB — BASIC METABOLIC PANEL
Anion gap: 10 (ref 5–15)
Anion gap: 10 (ref 5–15)
BUN: 40 mg/dL — ABNORMAL HIGH (ref 6–20)
BUN: 32 mg/dL — AB (ref 6–20)
CALCIUM: 8.8 mg/dL — AB (ref 8.9–10.3)
CHLORIDE: 106 mmol/L (ref 101–111)
CO2: 20 mmol/L — ABNORMAL LOW (ref 22–32)
CO2: 23 mmol/L (ref 22–32)
CREATININE: 1.4 mg/dL — AB (ref 0.61–1.24)
Calcium: 9 mg/dL (ref 8.9–10.3)
Chloride: 108 mmol/L (ref 101–111)
Creatinine, Ser: 1.29 mg/dL — ABNORMAL HIGH (ref 0.61–1.24)
GFR calc Af Amer: 57 mL/min — ABNORMAL LOW (ref >60)
GFR calc Af Amer: >60 mL/min (ref >60)
GFR calc non Af Amer: 49 mL/min — ABNORMAL LOW (ref >60)
GFR calc non Af Amer: 54 mL/min — ABNORMAL LOW (ref >60)
GLUCOSE: 280 mg/dL — AB (ref 70–99)
GLUCOSE: 233 mg/dL — AB (ref 70–99)
POTASSIUM: 3.3 mmol/L — AB (ref 3.5–5.1)
Potassium: 4.1 mmol/L (ref 3.5–5.1)
Sodium: 138 mmol/L (ref 135–145)
Sodium: 139 mmol/L (ref 135–145)

## 2014-05-16 LAB — HEPATIC FUNCTION PANEL
ALT: 27 U/L (ref 17–63)
AST: 20 U/L (ref 15–41)
Albumin: 3.5 g/dL (ref 3.5–5.0)
Alkaline Phosphatase: 97 U/L (ref 38–126)
Bilirubin, Direct: 0.2 mg/dL (ref 0.1–0.5)
Indirect Bilirubin: 0.5 mg/dL (ref 0.3–0.9)
Total Bilirubin: 0.7 mg/dL (ref 0.3–1.2)
Total Protein: 6.5 g/dL (ref 6.5–8.1)

## 2014-05-16 LAB — GLUCOSE, CAPILLARY
GLUCOSE-CAPILLARY: 244 mg/dL — AB (ref 70–99)
GLUCOSE-CAPILLARY: 286 mg/dL — AB (ref 70–99)
Glucose-Capillary: 277 mg/dL — ABNORMAL HIGH (ref 70–99)

## 2014-05-16 LAB — PROTIME-INR
INR: 1.3 (ref 0.00–1.49)
PROTHROMBIN TIME: 16.3 s — AB (ref 11.6–15.2)

## 2014-05-16 LAB — MAGNESIUM
Magnesium: 1.6 mg/dL — ABNORMAL LOW (ref 1.7–2.4)
Magnesium: 3.8 mg/dL — ABNORMAL HIGH (ref 1.7–2.4)

## 2014-05-16 LAB — T4, FREE: Free T4: 1.05 ng/dL (ref 0.61–1.12)

## 2014-05-16 LAB — HEPARIN LEVEL (UNFRACTIONATED)
HEPARIN UNFRACTIONATED: 0.78 [IU]/mL — AB (ref 0.30–0.70)
HEPARIN UNFRACTIONATED: 0.28 [IU]/mL — AB (ref 0.30–0.70)

## 2014-05-16 LAB — POCT ACTIVATED CLOTTING TIME: Activated Clotting Time: 165 seconds

## 2014-05-16 LAB — PHOSPHORUS: PHOSPHORUS: 3.7 mg/dL (ref 2.5–4.6)

## 2014-05-16 SURGERY — RIGHT/LEFT HEART CATH AND CORONARY ANGIOGRAPHY
Anesthesia: LOCAL

## 2014-05-16 MED ORDER — MIDAZOLAM HCL 2 MG/2ML IJ SOLN
INTRAMUSCULAR | Status: DC | PRN
  Administered 2014-05-16 (×2): 1 mg via INTRAVENOUS

## 2014-05-16 MED ORDER — FUROSEMIDE 10 MG/ML IJ SOLN
INTRAMUSCULAR | Status: DC | PRN
  Administered 2014-05-16: 40 mg via INTRAVENOUS

## 2014-05-16 MED ORDER — ROCURONIUM BROMIDE 50 MG/5ML IV SOLN
INTRAVENOUS | Status: AC
Start: 2014-05-16 — End: 2014-05-17
  Filled 2014-05-16: qty 2

## 2014-05-16 MED ORDER — FENTANYL CITRATE (PF) 100 MCG/2ML IJ SOLN
INTRAMUSCULAR | Status: AC
  Filled 2014-05-16: qty 2

## 2014-05-16 MED ORDER — ASPIRIN 81 MG PO CHEW: 81.0000 mg | CHEWABLE_TABLET | ORAL | Status: AC

## 2014-05-16 MED ORDER — SODIUM CHLORIDE 0.9 % IJ SOLN: 3.0000 mL | INTRAMUSCULAR | Status: DC | PRN

## 2014-05-16 MED ORDER — MAGNESIUM SULFATE 2 GM/50ML IV SOLN
2.0000 g | Freq: Once | INTRAVENOUS | Status: AC
  Administered 2014-05-16: 2 g via INTRAVENOUS
  Filled 2014-05-16 (×2): qty 50

## 2014-05-16 MED ORDER — SODIUM CHLORIDE 0.9 % IV SOLN: 250.0000 mL | INTRAVENOUS | Status: DC | PRN

## 2014-05-16 MED ORDER — MILRINONE IN DEXTROSE 20 MG/100ML IV SOLN
0.2500 ug/kg/min | INTRAVENOUS | Status: DC
  Administered 2014-05-16: 0.25 ug/kg/min via INTRAVENOUS

## 2014-05-16 MED ORDER — ASPIRIN 81 MG PO CHEW
CHEWABLE_TABLET | ORAL | Status: AC
  Filled 2014-05-16: qty 1

## 2014-05-16 MED ORDER — HEPARIN (PORCINE) IN NACL 2-0.9 UNIT/ML-% IJ SOLN
INTRAMUSCULAR | Status: AC
  Filled 2014-05-16: qty 1500

## 2014-05-16 MED ORDER — FUROSEMIDE 10 MG/ML IJ SOLN
INTRAMUSCULAR | Status: DC | PRN
Start: 2014-05-16 — End: 2014-05-16
  Administered 2014-05-16: 40 mg via INTRAVENOUS

## 2014-05-16 MED ORDER — POTASSIUM CHLORIDE CRYS ER 20 MEQ PO TBCR
40.0000 meq | EXTENDED_RELEASE_TABLET | ORAL | Status: AC
  Administered 2014-05-16 – 2014-05-17 (×2): 40 meq via ORAL
  Filled 2014-05-16 (×2): qty 2

## 2014-05-16 MED ORDER — VANCOMYCIN HCL IN DEXTROSE 1-5 GM/200ML-% IV SOLN
1000.0000 mg | Freq: Once | INTRAVENOUS | Status: AC
  Administered 2014-05-16: 1000 mg via INTRAVENOUS
  Filled 2014-05-16: qty 200

## 2014-05-16 MED ORDER — MIDAZOLAM HCL 2 MG/2ML IJ SOLN
INTRAMUSCULAR | Status: AC
  Filled 2014-05-16: qty 2

## 2014-05-16 MED ORDER — MIDAZOLAM HCL 2 MG/2ML IJ SOLN
INTRAMUSCULAR | Status: DC
Start: 2014-05-16 — End: 2014-05-16
  Filled 2014-05-16: qty 2

## 2014-05-16 MED ORDER — FENTANYL CITRATE (PF) 100 MCG/2ML IJ SOLN
INTRAMUSCULAR | Status: DC | PRN
  Administered 2014-05-16: 25 ug via INTRAVENOUS
  Administered 2014-05-16: 50 ug via INTRAVENOUS

## 2014-05-16 MED ORDER — SODIUM CHLORIDE 0.9 % IV SOLN
INTRAVENOUS | Status: DC
  Administered 2014-05-16: 100 mL/h via INTRAVENOUS

## 2014-05-16 MED ORDER — LIDOCAINE HCL (CARDIAC) 20 MG/ML IV SOLN
INTRAVENOUS | Status: AC
  Filled 2014-05-16: qty 5

## 2014-05-16 MED ORDER — IOHEXOL 350 MG/ML SOLN
INTRAVENOUS | Status: DC | PRN
  Administered 2014-05-16: 20 mL via INTRA_ARTERIAL

## 2014-05-16 MED ORDER — ONDANSETRON HCL 4 MG/2ML IJ SOLN: 4.0000 mg | Freq: Four times a day (QID) | INTRAMUSCULAR | Status: DC | PRN

## 2014-05-16 MED ORDER — MILRINONE IN DEXTROSE 20 MG/100ML IV SOLN
0.2500 ug/kg/min | INTRAVENOUS | Status: DC
  Administered 2014-05-16: 0.25 ug/kg/min via INTRAVENOUS
  Filled 2014-05-16 (×2): qty 100

## 2014-05-16 MED ORDER — AMIODARONE HCL IN DEXTROSE 360-4.14 MG/200ML-% IV SOLN
INTRAVENOUS | Status: AC
Start: 2014-05-16 — End: 2014-05-17
  Filled 2014-05-16: qty 200

## 2014-05-16 MED ORDER — ACETAMINOPHEN 325 MG PO TABS: 650.0000 mg | ORAL_TABLET | ORAL | Status: DC | PRN

## 2014-05-16 MED ORDER — VERAPAMIL HCL 2.5 MG/ML IV SOLN
INTRAVENOUS | Status: AC
  Filled 2014-05-16: qty 2

## 2014-05-16 MED ORDER — SODIUM CHLORIDE 0.9 % IJ SOLN
3.0000 mL | Freq: Two times a day (BID) | INTRAMUSCULAR | Status: DC
  Administered 2014-05-16 – 2014-05-20 (×4): 3 mL via INTRAVENOUS

## 2014-05-16 MED ORDER — AMIODARONE HCL IN DEXTROSE 360-4.14 MG/200ML-% IV SOLN
60.0000 mg/h | INTRAVENOUS | Status: AC
  Administered 2014-05-16: 60 mg/h via INTRAVENOUS
  Filled 2014-05-16: qty 200

## 2014-05-16 MED ORDER — AMIODARONE HCL IN DEXTROSE 360-4.14 MG/200ML-% IV SOLN
30.0000 mg/h | INTRAVENOUS | Status: DC
Start: 2014-05-17 — End: 2014-05-17
  Administered 2014-05-17: 30 mg/h via INTRAVENOUS
  Filled 2014-05-16 (×3): qty 200

## 2014-05-16 MED ORDER — FUROSEMIDE 10 MG/ML IJ SOLN
INTRAMUSCULAR | Status: AC
  Filled 2014-05-16: qty 8

## 2014-05-16 MED ORDER — AMIODARONE IV BOLUS ONLY 150 MG/100ML
150.0000 mg | Freq: Once | INTRAVENOUS | Status: DC
  Filled 2014-05-16: qty 100

## 2014-05-16 MED ORDER — LIDOCAINE HCL 2 % EX GEL
1.0000 "application " | Freq: Once | CUTANEOUS | Status: AC
  Administered 2014-05-17: 1 via URETHRAL
  Filled 2014-05-16: qty 5

## 2014-05-16 MED ORDER — HEPARIN SODIUM (PORCINE) 1000 UNIT/ML IJ SOLN
INTRAMUSCULAR | Status: DC | PRN
  Administered 2014-05-16: 3000 [IU] via INTRAVENOUS
  Administered 2014-05-16: 2000 [IU] via INTRAVENOUS

## 2014-05-16 MED ORDER — AMIODARONE LOAD VIA INFUSION
150.0000 mg | Freq: Once | INTRAVENOUS | Status: AC
  Administered 2014-05-16: 150 mg via INTRAVENOUS
  Filled 2014-05-16: qty 83.34

## 2014-05-16 MED ORDER — SUCCINYLCHOLINE CHLORIDE 20 MG/ML IJ SOLN
INTRAMUSCULAR | Status: AC
  Filled 2014-05-16: qty 1

## 2014-05-16 MED ORDER — LIDOCAINE IN D5W 4-5 MG/ML-% IV SOLN
1.0000 mg/min | INTRAVENOUS | Status: DC
  Administered 2014-05-16: 1 mg/min via INTRAVENOUS
  Administered 2014-05-17 – 2014-05-18 (×3): 2 mg/min via INTRAVENOUS
  Administered 2014-05-19 – 2014-05-22 (×3): 1 mg/min via INTRAVENOUS
  Filled 2014-05-16 (×7): qty 500

## 2014-05-16 MED ORDER — ETOMIDATE 2 MG/ML IV SOLN
INTRAVENOUS | Status: AC
Start: 2014-05-16 — End: 2014-05-17
  Filled 2014-05-16: qty 20

## 2014-05-16 MED ORDER — LIDOCAINE HCL (PF) 1 % IJ SOLN
INTRAMUSCULAR | Status: AC
  Filled 2014-05-16: qty 30

## 2014-05-16 MED ORDER — SODIUM CHLORIDE 0.9 % IJ SOLN
3.0000 mL | Freq: Two times a day (BID) | INTRAMUSCULAR | Status: DC
  Administered 2014-05-16 – 2014-05-17 (×2): 3 mL via INTRAVENOUS

## 2014-05-16 MED ORDER — VERAPAMIL HCL 2.5 MG/ML IV SOLN
INTRAVENOUS | Status: DC | PRN
  Administered 2014-05-16: 15:00:00 via INTRA_ARTERIAL

## 2014-05-16 SURGICAL SUPPLY — 18 items
BALLN LINEAR 7.5FR IABP 40CC (BALLOONS) ×2
BALLOON LINEAR 7.5FR IABP 40CC (BALLOONS) ×1 IMPLANT
CATH BALLN WEDGE 5F 110CM (CATHETERS) ×2 IMPLANT
CATH INFINITI 5 FR JL3.5 (CATHETERS) ×2 IMPLANT
CATH INFINITI JR4 5F (CATHETERS) ×2 IMPLANT
CATH SWAN VIP NON-HEP 7.5F (CATHETERS) ×2 IMPLANT
DEVICE RAD COMP TR BAND LRG (VASCULAR PRODUCTS) ×2 IMPLANT
ELECT DEFIB PAD ADLT CADENCE (PAD) ×2 IMPLANT
GLIDESHEATH SLEND SS 6F .021 (SHEATH) ×2 IMPLANT
KIT HEART LEFT (KITS) ×2 IMPLANT
PACK CARDIAC CATHETERIZATION (CUSTOM PROCEDURE TRAY) ×2 IMPLANT
SHEATH FAST CATH BRACH 5F 5CM (SHEATH) ×2 IMPLANT
SHEATH PINNACLE 8F 10CM (SHEATH) ×2 IMPLANT
SLEEVE REPOSITIONING LENGTH 30 (MISCELLANEOUS) ×2 IMPLANT
TRANSDUCER W/STOPCOCK (MISCELLANEOUS) ×2 IMPLANT
TUBING CIL FLEX 10 FLL-RA (TUBING) ×2 IMPLANT
WIRE EMERALD 3MM-J .025X260CM (WIRE) ×2 IMPLANT
WIRE SAFE-T 1.5MM-J .035X260CM (WIRE) ×2 IMPLANT

## 2014-05-16 NOTE — Progress Notes (Signed)
Patient arrived from [facility] via stretcher/care link, he is alert and orieneted, hearing aid in place to right ear.  ntg paste to left chest in place, heparin gtt infusing, no changes during transport per carelink RN.  Cath lab aware of patient being here, IV fluids started per cardiology order at 100 ml/hr.  Will continue to monitor closely

## 2014-05-16 NOTE — Progress Notes (Signed)
Orthopedic Tech Progress Note Patient Details:  Jeffrey [name] [birth year] [id number]  Ortho Devices Type of Ortho Device: Knee Immobilizer Ortho Device/Splint Location: lle Ortho Device/Splint Interventions: Ordered As ordered by Dr. Clayborn Bigness, Jeffrey [name] 06/06/2014, 4:16 PM

## 2014-05-16 NOTE — Progress Notes (Signed)
[place] Progress Note Patient Name: Jeffrey [name] DOB: 1940-03-28 MRN: [id number]   Date of Service  05/14/2014  HPI/Events of Note  Mild low temp, cardiogenixc hsock  eICU Interventions  Bear hugger     Intervention Category Intermediate Interventions: OtherRaylene Miyamoto. 05/30/2014, 9:58 PM

## 2014-05-16 NOTE — Progress Notes (Signed)
PT IS ALERT AND ORIENTED. HR AFIB/FLUTTER W/ OCCASSIONAL MULTIFOCAL PVC'S. DENIES ANY SOB, CHEST PAIN OR DISCOMFORT.HEPARIN DRIP INFUSING IN RT FOREARM @1200UNITS /HR. LT AC NSL PATENT.   [facility] HAS ARRIVED TO TRANSFER PT TO ROOM 2H13 @ [facility] FOR CARDIAC CATH. JENIFER [place] RN ON 2H HAS BEEN CALLED REPORT. FAMILY TO FOLLOW PT TO [facility].

## 2014-05-16 NOTE — Care Management Note (Signed)
Case Management Note  Patient Details  Name: Jeffrey [name] MRN: [id number] Date of Birth: January 21, 1940  Subjective/Objective:                    Action/Plan:   Expected Discharge Date:  05/17/14               Expected Discharge Plan:  Home/Self Care  In-House Referral:  NA  Discharge planning Services  CM Consult  Post Acute Care Choice:  NA Choice offered to:  NA  DME Arranged:    DME Agency:     HH Arranged:    HH Agency:     Status of Service:  In process, will continue to follow  Medicare Important Message Given:    Date Medicare IM Given:    Medicare IM give by:    Date Additional Medicare IM Given:    Additional Medicare Important Message give by:     If discussed at [place] of Stay Meetings, dates discussed:    Additional Comments:  Lacretia Leigh, RN 05/12/2014, 11:26 AM

## 2014-05-16 NOTE — Progress Notes (Signed)
Patient having more ventricular arrhythmias. Rebolused with Amio 150mg  IV and continue gtt.  Will start Lido gtt at 1mg /kg.  Potassium 3.3 so will replete with Kdur 10meq now and repeat x 1 in 4 hours.  Mag level ok.

## 2014-05-16 NOTE — Progress Notes (Signed)
Technical Description:  - CPR performance duration:  < 74min   - Was defibrillation or cardioversion used?  YES   - Was external pacer placed? No  - Was patient intubated pre/post CPR? No    Post CPR evaluation:  - Final Status - Was patient successfully resuscitated ? Yes - What is current rhythm?   A fibn - What is current hemodynamic status? On IABP, acceptable BP  Miscellaneous Information:  - Labs sent, including: CBC, CMET  - Primary team notified?  Yes  - Family Notified? No  - Additional notes/ transfer status:  VF on monitor - shock x 1 with return of pulse, good mentation, amio bolus & 2 gm Mg bolus given  Rigoberto Noel MD

## 2014-05-16 NOTE — Progress Notes (Signed)
Patient was transported to [facility] today under cardiology service for cardiac catheterization prior to being seen by me today. Review of records indicated that he had no new complaints. No shortness of breath or chest pain. CT chest was negative for PE. 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 He is being transferred to evaluate for possible ischemic etiology of cardiomyopathy. He will be under the care of Dr. Daneen Schick.  [name],Jeffrey

## 2014-05-16 NOTE — H&P (View-Only) (Signed)
Labs reviewed from [facility].  Magnesium is 1.6. Will give 2gm Mag sulfate given 2 episodes of VT over here at [facility] yesterday. F/u level in AM.  Note Cr has bumped. BUN 34/Cr 1.22 -> BUN 40/Cr 1.40 today. Dr. Domenic Polite and I do not feel this is prohibitive of cath. Lasix was held this AM. Will also hold the lisinopril QHS and spironolactone for tomorrow pre-emptively to reduce risk of CIN. These can be added back on tomorrow if Cr is stable. (He was on the doses of Lasix 40mg  IV daily, Lisinopril 20mg  QHS, and spironolactone 12.5mg  daily.) Just didn't want them to be ordered/given before we had a chance to assess Cr trend in rounds tomorrow. We also asked Trish to make the lab aware that LV gram should not be performed.  Zoraya Fiorenza PA-C

## 2014-05-16 NOTE — Progress Notes (Signed)
Chaplain responded to Code Blue.   Large number of family members gathered together. During code some members approached the desk and asked to wait, went back in the waiting room. Family was very anxious and fearful because they couldn't go in the room.   Chaplain supported family during and after the code. Family members expressed that this is really sudden. Expressed pt was at a hospital in [place] and almost discharged but pt son refuted. Pt then transported to [facility] for cath and then received more unfavorable news.   Family is upset around that issue and vocal about its suddenness. Family has strong positive support, report being spiritual. Their pastor is present with them as well.   Family is very emotional given the critical nature of the pt in addition to pt condition being unexpected. Experiencing anticipatory grief and uncertainty.   Nurse to speak with family in waiting area.   Delford Field, Chaplain 05/15/2014

## 2014-05-16 NOTE — Progress Notes (Signed)
Consulting cardiologist: Dr. Kate Sable  Patient: Jeffrey [name] / Admit Date: 05/20/2014 / Date of Encounter: 05/14/2014, 8:14 AM   Subjective: Feeling so/so this AM. Not sleeping well. No chest pain or dyspnea.   Objective: Telemetry: atrial fib, rate controlled. There are 2 episodes of WCT, each precipitated by 2 PVCs, then a flip in axis through still appearing concerning for VT because it does not appear similar to his sinus morphology. Episode 1 at 1155 lasting 15 sec, episode 2 at 1736 lasting 1.92min Physical Exam: Blood pressure 116/84, pulse 68, temperature 98.1 F (36.7 C), temperature source Oral, resp. rate 20, height 5\' 8"  (1.727 m), weight 201 lb 11.5 oz (91.5 kg), SpO2 95 %. General: Well developed, well nourished elderly M, in no acute distress. Head: Normocephalic, atraumatic, sclera non-icteric, no xanthomas, nares are without discharge. Neck: JVP mod elevated. Lungs: Diminished right lung base. Otherwise no wheezes, rales, or rhonchi. Breathing is unlabored. Heart: Irregularly irregular, controlled rate, S1 S2 without murmurs, rubs, or gallops.  Abdomen: Soft, non-tender, non-distended with normoactive bowel sounds. No rebound/guarding. Extremities: No clubbing or cyanosis. No edema. Distal pedal pulses are 2+ and equal bilaterally. Neuro: Alert and oriented X 3. Moves all extremities spontaneously. Hard of hearing. Psych:  Responds to questions appropriately with a normal affect.   Intake/Output Summary (Last 24 hours) at 05/28/2014 0814 Last data filed at 05/15/2014 0700  Gross per 24 hour  Intake 829.85 ml  Output   1400 ml  Net -570.15 ml    Inpatient Medications:  . aspirin EC  81 mg Oral Daily  . atorvastatin  20 mg Oral QHS  . furosemide  40 mg Intravenous Daily  . glipiZIDE  5 mg Oral BID AC  . insulin aspart  0-20 Units Subcutaneous TID WC  . insulin aspart  0-5 Units Subcutaneous QHS  . levothyroxine  50 mcg Oral QAC breakfast  . lisinopril  20  mg Oral QHS  . metoprolol succinate  25 mg Oral BID  . multivitamin with minerals  1 tablet Oral Daily  . nitroGLYCERIN  1 inch Topical 4 times per day  . omega-3 acid ethyl esters  1,000 mg Oral Daily  . potassium chloride  20 mEq Oral Daily  . sodium chloride  3 mL Intravenous Q12H  . spironolactone  12.5 mg Oral Daily   Infusions:  . amiodarone 30 mg/hr (05/19/2014 0700)  . heparin 1,400 Units/hr (06/03/2014 0700)    Labs:  Recent Labs  06/11/2014 1730 05/15/14 0342  NA 141 140  K 4.1 3.8  CL 111 112*  CO2 21* 21*  GLUCOSE 212* 150*  BUN 33* 34*  CREATININE 1.19 1.22  CALCIUM 8.7* 8.5*  MG 1.5*  --     Recent Labs  05/15/14 0342  AST 16  ALT 23  ALKPHOS 88  BILITOT 0.9  PROT 6.3*  ALBUMIN 3.4*    Recent Labs  06/11/2014 1630 05/15/14 0342 05/15/2014 0442  WBC 9.1 9.7 11.1*  NEUTROABS 7.1  --   --   HGB 13.0 12.4* 12.7*  HCT 42.0 40.5 42.3  MCV 78.1 78.0 77.6*  PLT 271 234 263    Recent Labs  05/30/2014 1730 06/06/2014 2114 05/15/14 0342 05/15/14 0900  TROPONINI 0.39* 0.45* 0.37* 0.35*   Invalid input(s): POCBNP No results for input(s): HGBA1C in the last 72 hours.   Radiology/Studies:  Dg Chest 2 View  06/03/2014   CLINICAL DATA:  Worsening shortness of breath, nonproductive cough  EXAM: CHEST  2 VIEW  COMPARISON:  None.  FINDINGS: Lungs are clear.  No pleural effusion or pneumothorax.  Cardiomegaly.  Degenerative changes of the visualized thoracolumbar spine.  IMPRESSION: No evidence of acute cardiopulmonary disease.   Electronically Signed   By: Julian Hy M.D.   On: 05/26/2014 16:45   Ct Angio Chest Pe W/cm &/or Wo Cm  05/15/2014   CLINICAL DATA:  Shortness of breath for 4-5 days. Increased fatigue. Atrial fibrillation.  EXAM: CT ANGIOGRAPHY CHEST WITH CONTRAST  TECHNIQUE: Multidetector CT imaging of the chest was performed using the standard protocol during bolus administration of intravenous contrast. Multiplanar CT image reconstructions and MIPs  were obtained to evaluate the vascular anatomy.  CONTRAST:  127mL OMNIPAQUE IOHEXOL 350 MG/ML SOLN  COMPARISON:  Chest radiographs 05/13/2014  FINDINGS: No lobar or more central pulmonary embolus is identified. Segmental and subsegmental branches are suboptimally evaluated to varying degrees due to motion artifact and suboptimal contrast opacification.  Subcentimeter mediastinal lymph nodes are likely reactive. Three-vessel coronary artery calcification is present. There is a 2 cm focus of irregular density in the lumen of the left ventricle, with a band of motion artifact extending through this level. There is reflux of contrast into the IVC and hepatic veins which could be related to injection technique or a component of right heart failure.  There is a moderate right pleural effusion. Evaluation of the lung parenchyma is limited by respiratory motion artifact. Compressive atelectasis is present in the right lower lobe. Mild perihilar ground-[name] opacities versus motion artifact.  A small amount of free fluid is partially visualized in the upper abdomen. There is body wall edema. Bridging osteophytosis is present throughout the thoracic spine.  Review of the MIP images confirms the above findings.  IMPRESSION: 1. No evidence of central pulmonary emboli. Suboptimal evaluation of segmental and subsegmental branches due to motion artifact. 2. Evidence of generalized volume overload with moderate right pleural effusion, small volume upper abdominal ascites, and body wall edema. 3. Suboptimal evaluation of the lung parenchyma due to motion artifact. Very mild pulmonary edema not excluded. 4. Small focus of irregular density in the left ventricular lumen. This may be artifactual given motion through this region or represent a small amount of IV contrast. Correlate with pending echocardiogram to exclude thrombus or other lesion.   Electronically Signed   By: Logan Bores   On: 05/15/2014 11:43     Assessment and Plan   85M with HTN, DM, HLD presented with 4-5 day hx of worsening DOE, found to be in AF with HR 109. Troponins elevated to peak 0.45, RBBB. CTA no PE. Also newly diagnosed EF 25% with diffuse HK.  1. Newly recognized atrial fib with RVR - unclear chronicity but there is concern it may be longstanding given absence of palpitations and severe LAE. Continue heparin per pharmacy - will need to determine plan for cath before committing to oral form or considering attempt at TEE/DCCV. If DCCV is considered he likely will not maintain NSR without antiarrhythmic therapy.  2. Acute systolic CHF EF 78% of unknown chronicity - CTA showed moderate R effusion, small volume upper abdominal ascites and abdominal wall edema. It also showed small focus of irregular. Echocardiogram does not specifically mention an LV thrombus. Cardiac catheterization discussed to assess for underlying ischemic heart disease as potential etiology.  3. NSTEMI - likely needs LHC given the above findings. Will discuss transfer to [facility] with MD. Continue ASA, BB, statin. Check lipids in AM to see if  statin needs titration. Continue heparin per pharmacy.  4. Wide complex tachycardia concerning for VT - placed on amiodarone by primary team yesterday. Check stat BMET, Mg this AM. Mg was 1.5 on admission. Replete if necessary.  5. Hypothyroidism - add free T4 and adjust levothyroxine if necessary.  6. HTN - follow BP with advancement of CHF regimen.  7. DM - per IM.  8. Minimal microcytic anemia - follow. No bleeding reported.  9. Hypocalcemia - f/u labs this AM. Albumin   Signed, Burna Mortimer Pager: [phone]   Attending note:  Patient seen and examined. Reviewed records including consultation note by Dr. Bronson Ing from yesterday. Mr. Nies presents with newly documented atrial fibrillation and cardiomyopathy with mild troponin I elevation in the setting of progressive shortness of breath over the last one to 2 weeks. Duration of  atrial fibrillation and cardiomyopathy are not certain, possibly longer standing. He has significant risk factors for ischemic heart disease including hypertension, type 2 diabetes mellitus, and also family history of premature cardiac disease. Echocardiogram shows LVEF of approximately 25% with diffuse hypokinesis. Peak troponin I is only 0.45. He also had a chest CT angiogram showing no evidence of pulmonary embolus, moderate right sided pleural effusion with abdominal ascites, also small irregularity noted within the left ventricle that was potentially artifactual, however could not exclude thrombus.  On examination this morning he appears comfortable at rest, heart rate is in the 70s to 80s in atrial fibrillation. Examination of the lungs finds persistent evidence of right pleural effusion, cardiac exam with irregularly irregular rhythm and indistinct PMI, no S3. Telemetry reviewed finding episode of wide-complex tachycardia concerning for VT yesterday afternoon. He was placed on amiodarone by the primary team.  Situation discussed with the patient and daughter at bedside. Anticipate transfer to our cardiology service at [facility] for diagnostic left and right heart catheterization. We will hold Lasix and potassium for now pending procedure. Otherwise continue aspirin, Lipitor, lisinopril, Toprol-XL, Aldactone, Nitropaste, Heparin GTT and omega-3 supplements. Stopping amiodarone for now. As part of his ongoing evaluation, would recommend a limited echocardiogram with microbubble image enhancement to more definitively exclude LV thrombus. Would avoid LV-gram. CHADSVASC score is 4-5 and he will oral need anticoagulation eventually. Depending on options for revascularization and potential need for DAPT, this can be considered further later.  Satira Sark, M.D., F.A.C.C.

## 2014-05-16 NOTE — Progress Notes (Signed)
[place] Progress Note Patient Name: Jeffrey [name] DOB: 04/17/1940 MRN: [id number]   Date of Service  05/21/2014  HPI/Events of Note  VF Dc milrinone Mag IV  eICU Interventions  pccm Dr Elsworth Soho immediatly at bedside Pt got shocked, to SR 90     Intervention Category Major Interventions: Code management / supervision  Raylene Miyamoto. 05/22/2014, 8:05 PM

## 2014-05-16 NOTE — Interval H&P Note (Signed)
History and Physical Interval Note:  06/03/2014 2:24 PM  Jeffrey [name]  has presented today for surgery, with the diagnosis of cp  The various methods of treatment have been discussed with the patient and family. After consideration of risks, benefits and other options for treatment, the patient has consented to  Procedure(s): Right/Left Heart Cath and Coronary Angiography (N/A) as a surgical intervention .  The patient's history has been reviewed, patient examined, no change in status, stable for surgery.  I have reviewed the patient's chart and labs.  Questions were answered to the patient's satisfaction.     [name]

## 2014-05-16 NOTE — Progress Notes (Signed)
CTSP secondary to vfib arrest.  Patient went into Vfib and was shocked x1 with less than 1 minute of CPR performed with return of pulse and mentation.  2gm mag bolus given and amio 300mg  bolus given.  Will start IV amio gtt at 60mg  /hr for 6 hours then 30mg  /hr.  If any further ventricular arrhythmias will start Lidocaine.  Send off BMET and mg levels stat.  Will restart Milrinone.  Discussed with Dr. Haroldine Laws and plan is to try to tune up heart failure prior to CVTS consult.

## 2014-05-16 NOTE — Progress Notes (Addendum)
ANTICOAGULATION CONSULT NOTE - Initial Consult  Pharmacy Consult for heparin Indication: IABP in place  Allergies  Allergen Reactions  . Penicillins Rash    Patient Measurements: Height: 5\' 9"  (175.3 cm) Weight: 199 lb 4.7 oz (90.4 kg) IBW/kg (Calculated) : 70.7 Heparin Dosing Weight: 89kg  Vital Signs: Temp: 96.8 F (36 C) (05/05 1124) Temp Source: Oral (05/05 1124) BP: 141/88 mmHg (05/05 1559) Pulse Rate: 0 (05/05 1604)  Labs:  Recent Labs  05/18/2014 1630 05/15/2014 1730 05/24/2014 2114 05/15/14 0342 05/15/14 0400 05/15/14 0900 05/15/14 1403 05/24/2014 0442 05/25/2014 0837  HGB 13.0  --   --  12.4*  --   --   --  12.7*  --   HCT 42.0  --   --  40.5  --   --   --  42.3  --   PLT 271  --   --  234  --   --   --  263  --   LABPROT  --   --   --   --   --   --   --   --  16.3*  INR  --   --   --   --   --   --   --   --  1.30  HEPARINUNFRC  --   --   --   --  0.26*  --  0.54 0.78*  --   CREATININE  --  1.19  --  1.22  --   --   --   --  1.40*  TROPONINI 0.40* 0.39* 0.45* 0.37*  --  0.35*  --   --   --     Estimated Creatinine Clearance: 53.8 mL/min (by C-G formula based on Cr of 1.4).   Medical History: Past Medical History  Diagnosis Date  . Type 2 diabetes mellitus   . Hypothyroidism   . Hyperlipidemia   . Essential hypertension   . Insomnia   . Depression   . Atrial fibrillation     Diagnosed May 2016    Medications:  Prescriptions prior to admission  Medication Sig Dispense Refill Last Dose  . aspirin EC 81 MG tablet Take 81 mg by mouth daily.   05/15/2014 at Unknown time  . atorvastatin (LIPITOR) 20 MG tablet Take 1 tablet by mouth at bedtime.    05/13/2014 at Unknown time  . fish oil-omega-3 fatty acids 1000 MG capsule Take 1,200 mg by mouth daily.   05/13/2014 at Unknown time  . glipiZIDE (GLUCOTROL) 5 MG tablet Take 1 tablet by mouth 2 (two) times daily.   06/11/2014 at Unknown time  . levothyroxine (SYNTHROID, LEVOTHROID) 50 MCG tablet Take 1 tablet by mouth  daily.   05/13/2014 at Unknown time  . lisinopril (PRINIVIL,ZESTRIL) 20 MG tablet Take 20 mg by mouth at bedtime.    05/13/2014 at Unknown time  . metFORMIN (GLUCOPHAGE) 500 MG tablet Take 2 tablets by mouth 2 (two) times daily.   06/10/2014 at Unknown time  . Multiple Vitamin (MULTIVITAMIN) tablet Take 1 tablet by mouth daily.   05/13/2014 at Unknown time  . zolpidem (AMBIEN) 10 MG tablet Take 10 mg by mouth at bedtime as needed. For sleep    unknown  . levothyroxine (SYNTHROID, LEVOTHROID) 25 MCG tablet Take 50 mcg by mouth daily.    04/29/2011 at Unknown   Scheduled:  . aspirin      . aspirin EC  81 mg Oral Daily  . atorvastatin  20 mg Oral  QHS  . glipiZIDE  5 mg Oral BID AC  . insulin aspart  0-20 Units Subcutaneous TID WC  . insulin aspart  0-5 Units Subcutaneous QHS  . levothyroxine  50 mcg Oral QAC breakfast  . metoprolol succinate  25 mg Oral BID  . multivitamin with minerals  1 tablet Oral Daily  . nitroGLYCERIN  1 inch Topical 4 times per day  . omega-3 acid ethyl esters  1,000 mg Oral Daily  . sodium chloride  3 mL Intravenous Q12H  . sodium chloride  3 mL Intravenous Q12H  . sodium chloride  3 mL Intravenous Q12H    Assessment: 74 yo male s/p cath with IABP in place. Pharmacy is consulted to dose heparin. Patient was on heparin prior to cath at 1200 units/hr and this was a decrease from 1400 units/hr after a supratherapeutic level (HL= 0.78). Hg/Hct= 12.7/42.3, plt= 263.    Goal of Therapy:  Heparin level 0.2-0.5 units/ml Monitor platelets by anticoagulation protocol: Yes   Plan:  -Start heparin at 1050 units/hr (~ 12 units/hr) and will titrate up based on levels -Heparin level in 8 hrs -Daily heparin level and CBC  Hildred Laser, Pharm D 05/22/2014 4:25 PM

## 2014-05-16 NOTE — Care Management Note (Deleted)
Case Management Note  Patient Details  Name: Jeffrey [name] MRN: [id number] Date of Birth: 1940-05-22  Subjective/Objective:     Adm w mi               Action/Plan: lives w wife   Expected Discharge Date:  05/17/14               Expected Discharge Plan:  Home/Self Care  In-House Referral:  NA  Discharge planning Services  CM Consult, Medication Assistance, [facility]  Post Acute Care Choice:  NA Choice offered to:  NA  DME Arranged:    DME Agency:     HH Arranged:    HH Agency:     Status of Service:  In process, will continue to follow  Medicare Important Message Given:    Date Medicare IM Given:    Medicare IM give by:    Date Additional Medicare IM Given:    Additional Medicare Important Message give by:     If discussed at [place] of Stay Meetings, dates discussed:    Additional Comments:5/5 gave pt inform on [facility] and [facility]. Gave pt 30day free brilinta card and placed pt assist form on shadow chart. Wife states pt does not have ins.  Lacretia Leigh, RN 05/15/2014, 1:27 PM

## 2014-05-16 NOTE — Progress Notes (Addendum)
Labs reviewed from [facility].  Magnesium is 1.6. Will give 2gm Mag sulfate given 2 episodes of VT over here at [facility] yesterday. F/u level in AM.  Note Cr has bumped. BUN 34/Cr 1.22 -> BUN 40/Cr 1.40 today. Dr. Domenic Polite and I do not feel this is prohibitive of cath. Lasix was held this AM. Will also hold the lisinopril QHS and spironolactone for tomorrow pre-emptively to reduce risk of CIN. These can be added back on tomorrow if Cr is stable. (He was on the doses of Lasix 40mg  IV daily, Lisinopril 20mg  QHS, and spironolactone 12.5mg  daily.) Just didn't want them to be ordered/given before we had a chance to assess Cr trend in rounds tomorrow. We also asked Trish to make the lab aware that LV gram should not be performed.  Dayna Dunn PA-C

## 2014-05-17 ENCOUNTER — Encounter (HOSPITAL_COMMUNITY): Payer: Self-pay | Admitting: Cardiovascular Disease

## 2014-05-17 ENCOUNTER — Inpatient Hospital Stay (HOSPITAL_COMMUNITY): Payer: Commercial Managed Care - HMO

## 2014-05-17 DIAGNOSIS — I481 Persistent atrial fibrillation: Secondary | ICD-10-CM

## 2014-05-17 DIAGNOSIS — I425 Other restrictive cardiomyopathy: Secondary | ICD-10-CM

## 2014-05-17 DIAGNOSIS — R570 Cardiogenic shock: Secondary | ICD-10-CM

## 2014-05-17 LAB — BASIC METABOLIC PANEL
Anion gap: 9 (ref 5–15)
Anion gap: 9 (ref 5–15)
BUN: 31 mg/dL — AB (ref 6–20)
BUN: 34 mg/dL — AB (ref 6–20)
CALCIUM: 8.2 mg/dL — AB (ref 8.9–10.3)
CO2: 24 mmol/L (ref 22–32)
CO2: 22 mmol/L (ref 22–32)
Calcium: 8.5 mg/dL — ABNORMAL LOW (ref 8.9–10.3)
Chloride: 106 mmol/L (ref 101–111)
Chloride: 101 mmol/L (ref 101–111)
Creatinine, Ser: 1.29 mg/dL — ABNORMAL HIGH (ref 0.61–1.24)
Creatinine, Ser: 1.5 mg/dL — ABNORMAL HIGH (ref 0.61–1.24)
GFR calc Af Amer: >60 mL/min (ref >60)
GFR calc non Af Amer: 54 mL/min — ABNORMAL LOW (ref >60)
GFR, EST AFRICAN AMERICAN: 52 mL/min — AB (ref >60)
GFR, EST NON AFRICAN AMERICAN: 45 mL/min — AB (ref >60)
GLUCOSE: 255 mg/dL — AB (ref 70–99)
Glucose, Bld: 275 mg/dL — ABNORMAL HIGH (ref 70–99)
Potassium: 3.6 mmol/L (ref 3.5–5.1)
Potassium: 4.3 mmol/L (ref 3.5–5.1)
Sodium: 139 mmol/L (ref 135–145)
Sodium: 132 mmol/L — ABNORMAL LOW (ref 135–145)

## 2014-05-17 LAB — COMPREHENSIVE METABOLIC PANEL
ALBUMIN: 3.2 g/dL — AB (ref 3.5–5.0)
ALT: 26 U/L (ref 17–63)
AST: 20 U/L (ref 15–41)
Alkaline Phosphatase: 91 U/L (ref 38–126)
Anion gap: 11 (ref 5–15)
BILIRUBIN TOTAL: 1 mg/dL (ref 0.3–1.2)
BUN: 32 mg/dL — ABNORMAL HIGH (ref 6–20)
CO2: 23 mmol/L (ref 22–32)
Calcium: 8.7 mg/dL — ABNORMAL LOW (ref 8.9–10.3)
Chloride: 103 mmol/L (ref 101–111)
Creatinine, Ser: 1.34 mg/dL — ABNORMAL HIGH (ref 0.61–1.24)
GFR calc Af Amer: 60 mL/min — ABNORMAL LOW (ref >60)
GFR, EST NON AFRICAN AMERICAN: 52 mL/min — AB (ref >60)
Glucose, Bld: 249 mg/dL — ABNORMAL HIGH (ref 70–99)
Potassium: 4.1 mmol/L (ref 3.5–5.1)
Sodium: 137 mmol/L (ref 135–145)
Total Protein: 5.8 g/dL — ABNORMAL LOW (ref 6.5–8.1)

## 2014-05-17 LAB — LIPID PANEL
CHOL/HDL RATIO: 2.9 ratio
Cholesterol: 100 mg/dL (ref 0–200)
HDL: 35 mg/dL — AB (ref >40)
LDL Cholesterol: 55 mg/dL (ref 0–99)
TRIGLYCERIDES: 50 mg/dL (ref <150)
VLDL: 10 mg/dL (ref 0–40)

## 2014-05-17 LAB — CBC
HCT: 38.8 % — ABNORMAL LOW (ref 39.0–52.0)
HCT: 36.9 % — ABNORMAL LOW (ref 39.0–52.0)
Hemoglobin: 12.1 g/dL — ABNORMAL LOW (ref 13.0–17.0)
Hemoglobin: 11.9 g/dL — ABNORMAL LOW (ref 13.0–17.0)
MCH: 23.8 pg — ABNORMAL LOW (ref 26.0–34.0)
MCH: 24.4 pg — AB (ref 26.0–34.0)
MCHC: 31.2 g/dL (ref 30.0–36.0)
MCHC: 32.2 g/dL (ref 30.0–36.0)
MCV: 76.2 fL — ABNORMAL LOW (ref 78.0–100.0)
MCV: 75.6 fL — AB (ref 78.0–100.0)
PLATELETS: 129 10*3/uL — AB (ref 150–400)
Platelets: 179 10*3/uL (ref 150–400)
RBC: 5.09 MIL/uL (ref 4.22–5.81)
RBC: 4.88 MIL/uL (ref 4.22–5.81)
RDW: 17.5 % — AB (ref 11.5–15.5)
RDW: 17.5 % — AB (ref 11.5–15.5)
WBC: 11.4 10*3/uL — ABNORMAL HIGH (ref 4.0–10.5)
WBC: 9.7 10*3/uL (ref 4.0–10.5)

## 2014-05-17 LAB — GLUCOSE, CAPILLARY
GLUCOSE-CAPILLARY: 267 mg/dL — AB (ref 70–99)
GLUCOSE-CAPILLARY: 210 mg/dL — AB (ref 70–99)
Glucose-Capillary: 207 mg/dL — ABNORMAL HIGH (ref 70–99)
Glucose-Capillary: 279 mg/dL — ABNORMAL HIGH (ref 70–99)

## 2014-05-17 LAB — HEPARIN LEVEL (UNFRACTIONATED)
HEPARIN UNFRACTIONATED: 0.25 [IU]/mL — AB (ref 0.30–0.70)
Heparin Unfractionated: 0.12 IU/mL — ABNORMAL LOW (ref 0.30–0.70)
Heparin Unfractionated: 0.32 IU/mL (ref 0.30–0.70)

## 2014-05-17 LAB — CARBOXYHEMOGLOBIN
Carboxyhemoglobin: 0.8 % (ref 0.5–1.5)
Carboxyhemoglobin: 1.2 % (ref 0.5–1.5)
Carboxyhemoglobin: 1 % (ref 0.5–1.5)
METHEMOGLOBIN: 1.1 % (ref 0.0–1.5)
Methemoglobin: 0.9 % (ref 0.0–1.5)
Methemoglobin: 1 % (ref 0.0–1.5)
O2 SAT: 54 %
O2 Saturation: 63.4 %
O2 Saturation: 61.1 %
TOTAL HEMOGLOBIN: 12 g/dL — AB (ref 13.5–18.0)
TOTAL HEMOGLOBIN: 11.8 g/dL — AB (ref 13.5–18.0)
Total hemoglobin: 11 g/dL — ABNORMAL LOW (ref 13.5–18.0)

## 2014-05-17 LAB — MAGNESIUM: Magnesium: 2 mg/dL (ref 1.7–2.4)

## 2014-05-17 MED ORDER — LIDOCAINE BOLUS VIA INFUSION
70.0000 mg | Freq: Once | INTRAVENOUS | Status: AC
  Administered 2014-05-17: 70 mg via INTRAVENOUS
  Filled 2014-05-17: qty 72

## 2014-05-17 MED ORDER — DOBUTAMINE IN D5W 4-5 MG/ML-% IV SOLN
2.5000 ug/kg/min | INTRAVENOUS | Status: DC
  Administered 2014-05-17: 2.5 ug/kg/min via INTRAVENOUS
  Administered 2014-05-18 – 2014-05-21 (×3): 5 ug/kg/min via INTRAVENOUS
  Filled 2014-05-17 (×5): qty 250

## 2014-05-17 MED ORDER — HYDRALAZINE HCL 25 MG PO TABS
12.5000 mg | ORAL_TABLET | Freq: Three times a day (TID) | ORAL | Status: DC
  Administered 2014-05-17 – 2014-05-22 (×13): 12.5 mg via ORAL
  Filled 2014-05-17 (×18): qty 0.5

## 2014-05-17 MED ORDER — PERFLUTREN LIPID MICROSPHERE
INTRAVENOUS | Status: AC
  Filled 2014-05-17: qty 10

## 2014-05-17 MED ORDER — PERFLUTREN LIPID MICROSPHERE
1.0000 mL | INTRAVENOUS | Status: AC | PRN
  Administered 2014-05-17: 2 mL via INTRAVENOUS
  Filled 2014-05-17: qty 10

## 2014-05-17 MED ORDER — AMIODARONE HCL IN DEXTROSE 360-4.14 MG/200ML-% IV SOLN
30.0000 mg/h | INTRAVENOUS | Status: DC
  Administered 2014-05-17 – 2014-05-18 (×4): 60 mg/h via INTRAVENOUS
  Administered 2014-05-18 – 2014-05-20 (×5): 30 mg/h via INTRAVENOUS
  Filled 2014-05-17 (×19): qty 200

## 2014-05-17 MED ORDER — INSULIN GLARGINE 100 UNIT/ML ~~LOC~~ SOLN
10.0000 [IU] | Freq: Every day | SUBCUTANEOUS | Status: DC
  Administered 2014-05-17 – 2014-05-20 (×4): 10 [IU] via SUBCUTANEOUS
  Filled 2014-05-17 (×5): qty 0.1

## 2014-05-17 MED ORDER — AMIODARONE LOAD VIA INFUSION
150.0000 mg | Freq: Once | INTRAVENOUS | Status: AC
  Administered 2014-05-17: 150 mg via INTRAVENOUS
  Filled 2014-05-17: qty 83.34

## 2014-05-17 MED ORDER — NOREPINEPHRINE BITARTRATE 1 MG/ML IV SOLN
0.0000 ug/min | INTRAVENOUS | Status: DC
  Administered 2014-05-17: 5 ug/min via INTRAVENOUS
  Filled 2014-05-17 (×2): qty 4

## 2014-05-17 MED ORDER — FUROSEMIDE 10 MG/ML IJ SOLN
40.0000 mg | Freq: Once | INTRAMUSCULAR | Status: AC
  Administered 2014-05-17: 40 mg via INTRAVENOUS
  Filled 2014-05-17: qty 4

## 2014-05-17 MED FILL — Lidocaine HCl Local Preservative Free (PF) Inj 1%: INTRAMUSCULAR | Qty: 30 | Status: AC

## 2014-05-17 MED FILL — Heparin Sodium (Porcine) 2 Unit/ML in Sodium Chloride 0.9%: INTRAMUSCULAR | Qty: 1000 | Status: AC

## 2014-05-17 NOTE — Consult Note (Signed)
Urology Consult   Physician requesting consult: Fransico Him, MD  Reason for consult: Difficult foley placement  History of Present Illness: Jeffrey [name] is a 74 y.o. male with recent MI requiring coronary artery revascularization and now in cardiac ICU in cardiogenic shock on balloon pump.  Difficult time placing foley last night.  We were consulted to assist.  Patient denies any urologic history.  No prostate, urethra or bladder issues.  No history of STIs.  One prior catheterization was uneventful per patient report.    Past Medical History  Diagnosis Date  . Type 2 diabetes mellitus   . Hypothyroidism   . Hyperlipidemia   . Essential hypertension   . Insomnia   . Depression   . Atrial fibrillation     Diagnosed May 2016    Past Surgical History  Procedure Laterality Date  . Appendectomy    . Shoulder surgery    . Colonoscopy  04/30/2011    Procedure: COLONOSCOPY;  Surgeon: Danie Binder, MD;  Location: [facility];  Service: Endoscopy;  Laterality: N/A;  8:30 AM  . Cardiac catheterization N/A 05/22/2014    Procedure: Right/Left Heart Cath and Coronary Angiography;  Surgeon: [name], MD;  Location: [facility] INVASIVE CV LAB CUPID;  Service: Cardiovascular;  Laterality: N/A;  . Cardiac catheterization N/A 06/02/2014    Procedure: IABP Insertion;  Surgeon: [name], MD;  Location: [facility] INVASIVE CV LAB CUPID;  Service: Cardiovascular;  Laterality: N/A;  . Right heart catheterization N/A 05/24/2014    Procedure: Right Heart Cath Additional;  Surgeon: Jolaine Artist, MD;  Location: [facility] INVASIVE CV LAB CUPID;  Service: Cardiovascular;  Laterality: N/A;    Medications:  Home meds:    Medication List    ASK your doctor about these medications        aspirin EC 81 MG tablet  Take 81 mg by mouth daily.     atorvastatin 20 MG tablet  Commonly known as:  LIPITOR  Take 1 tablet by mouth at bedtime.     fish oil-omega-3 fatty acids 1000 MG capsule  Take 1,200 mg by mouth  daily.     glipiZIDE 5 MG tablet  Commonly known as:  GLUCOTROL  Take 1 tablet by mouth 2 (two) times daily.     levothyroxine 25 MCG tablet  Commonly known as:  SYNTHROID, LEVOTHROID  Take 50 mcg by mouth daily.     levothyroxine 50 MCG tablet  Commonly known as:  SYNTHROID, LEVOTHROID  Take 1 tablet by mouth daily.     lisinopril 20 MG tablet  Commonly known as:  PRINIVIL,ZESTRIL  Take 20 mg by mouth at bedtime.     metFORMIN 500 MG tablet  Commonly known as:  GLUCOPHAGE  Take 2 tablets by mouth 2 (two) times daily.     multivitamin tablet  Take 1 tablet by mouth daily.     zolpidem 10 MG tablet  Commonly known as:  AMBIEN  Take 10 mg by mouth at bedtime as needed. For sleep        Scheduled Meds: . amiodarone  150 mg Intravenous Once  . aspirin EC  81 mg Oral Daily  . atorvastatin  20 mg Oral QHS  . glipiZIDE  5 mg Oral BID AC  . hydrALAZINE  12.5 mg Oral TID  . insulin aspart  0-20 Units Subcutaneous TID WC  . insulin aspart  0-5 Units Subcutaneous QHS  . insulin glargine  10 Units Subcutaneous QHS  . levothyroxine  50 mcg Oral QAC breakfast  . multivitamin with minerals  1 tablet Oral Daily  . sodium chloride  3 mL Intravenous Q12H  . sodium chloride  3 mL Intravenous Q12H  . sodium chloride  3 mL Intravenous Q12H   Continuous Infusions: . sodium chloride 100 mL/hr (05/30/2014 1150)  . amiodarone 60 mg/hr (05/17/14 1003)  . DOBUTamine 2.5 mcg/kg/min (05/17/14 0907)  . heparin 1,200 Units/hr (05/17/14 0535)  . lidocaine 2 mg/min (05/17/14 0222)   PRN Meds:.sodium chloride, sodium chloride, acetaminophen, morphine injection, ondansetron (ZOFRAN) IV, sodium chloride, sodium chloride, zolpidem  Allergies:  Allergies  Allergen Reactions  . Penicillins Rash    Family History  Problem Relation Age of Onset  . Cancer Sister   . Colon cancer Neg Hx     Social History:  reports that he quit smoking about 23 years ago. His smoking use included Cigarettes.  He smoked 0.50 packs per day. He has never used smokeless tobacco. He reports that he does not drink alcohol or use illicit drugs.  ROS: A complete review of systems was performed.  All systems are negative except for pertinent findings as noted.  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, urethal meatis tight Lymphatic: No lymphadenopathy Neurologic: Grossly intact, no focal deficits Psychiatric: Normal mood and affect  Laboratory Data:   Recent Labs  05/24/2014 1630 05/15/14 0342 05/31/2014 0442 05/23/2014 2020 05/17/14 0440  WBC 9.1 9.7 11.1* 4.7 11.4*  HGB 13.0 12.4* 12.7* 13.6 12.1*  HCT 42.0 40.5 42.3 42.3 38.8*  PLT 271 234 263 188 179     Recent Labs  05/15/14 0342 05/31/2014 0837 05/28/2014 2020 05/17/14 0155 05/17/14 0440  NA 140 138 139 139 137  K 3.8 4.1 3.3* 3.6 4.1  CL 112* 108 106 106 103  GLUCOSE 150* 280* 233* 255* 249*  BUN 34* 40* 32* 31* 32*  CALCIUM 8.5* 8.8* 9.0 8.5* 8.7*  CREATININE 1.22 1.40* 1.29* 1.29* 1.34*     Results for orders placed or performed during the hospital encounter of 05/19/2014 (from the past 24 hour(s))  I-STAT 3, venous blood gas (G3P V)     Status: Abnormal   Collection Time: 06/08/2014  2:46 PM  Result Value Ref Range   pH, Ven 7.302 (H) 7.250 - 7.300   pCO2, Ven 39.7 (L) 45.0 - 50.0 mmHg   pO2, Ven 22.0 (LL) 30.0 -  45.0 mmHg   Bicarbonate 19.7 (L) 20.0 - 24.0 mEq/L   TCO2 21 0 - 100 mmol/L   O2 Saturation 33.0 %   Acid-base deficit 6.0 (H) 0.0 - 2.0 mmol/L   Sample type VENOUS    Comment NOTIFIED PHYSICIAN   I-STAT 3, venous blood gas (G3P V)     Status: Abnormal   Collection Time: 05/29/2014  2:49 PM  Result Value Ref Range   pH, Ven 7.296 7.250 - 7.300  pCO2, Ven 39.5 (L) 45.0 - 50.0 mmHg   pO2, Ven 28.0 (LL) 30.0 - 45.0 mmHg   Bicarbonate 19.3 (L) 20.0 - 24.0 mEq/L   TCO2 20 0 - 100 mmol/L   O2 Saturation 47.0 %   Acid-base deficit 7.0 (H) 0.0 - 2.0 mmol/L   Sample type VENOUS    Comment NOTIFIED PHYSICIAN   I-STAT 3, arterial blood gas (G3+)     Status: Abnormal   Collection Time: 05/25/2014  2:56 PM  Result Value Ref Range   pH, Arterial 7.293 (L) 7.350 - 7.450   pCO2 arterial 38.9 35.0 - 45.0 mmHg   pO2, Arterial 72.0 (L) 80.0 - 100.0 mmHg   Bicarbonate 18.8 (L) 20.0 - 24.0 mEq/L   TCO2 20 0 - 100 mmol/L   O2 Saturation 92.0 %   Acid-base deficit 7.0 (H) 0.0 - 2.0 mmol/L   Sample type ARTERIAL   POCT Activated clotting time     Status: None   Collection Time: 05/14/2014  3:39 PM  Result Value Ref Range   Activated Clotting Time 165 seconds  Basic metabolic panel     Status: Abnormal   Collection Time: 05/18/2014  8:20 PM  Result Value Ref Range   Sodium 139 135 - 145 mmol/L   Potassium 3.3 (L) 3.5 - 5.1 mmol/L   Chloride 106 101 - 111 mmol/L   CO2 23 22 - 32 mmol/L   Glucose, Bld 233 (H) 70 - 99 mg/dL   BUN 32 (H) 6 - 20 mg/dL   Creatinine, Ser 1.29 (H) 0.61 - 1.24 mg/dL   Calcium 9.0 8.9 - 10.3 mg/dL   GFR calc non Af Amer 54 (L) >60 mL/min   GFR calc Af Amer >60 >60 mL/min   Anion gap 10 5 - 15  CBC     Status: Abnormal   Collection Time: 05/19/2014  8:20 PM  Result Value Ref Range   WBC 4.7 4.0 - 10.5 K/uL   RBC 5.57 4.22 - 5.81 MIL/uL   Hemoglobin 13.6 13.0 - 17.0 g/dL   HCT 42.3 39.0 - 52.0 %   MCV 75.9 (L) 78.0 - 100.0 fL   MCH 24.4 (L) 26.0 - 34.0 pg   MCHC 32.2 30.0  - 36.0 g/dL   RDW 17.6 (H) 11.5 - 15.5 %   Platelets 188 150 - 400 K/uL  Magnesium     Status: Abnormal   Collection Time: 06/03/2014  8:20 PM  Result Value Ref Range   Magnesium 3.8 (H) 1.7 - 2.4 mg/dL  Phosphorus     Status: None   Collection Time: 06/07/2014  8:20 PM  Result Value Ref Range   Phosphorus 3.7 2.5 - 4.6 mg/dL  Glucose, capillary     Status: Abnormal   Collection Time: 06/11/2014  9:34 PM  Result Value Ref Range   Glucose-Capillary 286 (H) 70 - 99 mg/dL   Comment 1 Capillary Specimen   Basic metabolic panel     Status: Abnormal   Collection Time: 05/17/14  1:55 AM  Result Value Ref Range   Sodium 139 135 - 145 mmol/L   Potassium 3.6 3.5 - 5.1 mmol/L   Chloride 106 101 - 111 mmol/L   CO2 24 22 - 32 mmol/L   Glucose, Bld 255 (H) 70 - 99 mg/dL   BUN 31 (H) 6 - 20 mg/dL   Creatinine, Ser 1.29 (H) 0.61 - 1.24 mg/dL   Calcium 8.5 (L) 8.9 - 10.3 mg/dL   GFR calc non  Af Amer 54 (L) >60 mL/min   GFR calc Af Amer >60 >60 mL/min   Anion gap 9 5 - 15  Comprehensive metabolic panel     Status: Abnormal   Collection Time: 05/17/14  4:40 AM  Result Value Ref Range   Sodium 137 135 - 145 mmol/L   Potassium 4.1 3.5 - 5.1 mmol/L   Chloride 103 101 - 111 mmol/L   CO2 23 22 - 32 mmol/L   Glucose, Bld 249 (H) 70 - 99 mg/dL   BUN 32 (H) 6 - 20 mg/dL   Creatinine, Ser 1.34 (H) 0.61 - 1.24 mg/dL   Calcium 8.7 (L) 8.9 - 10.3 mg/dL   Total Protein 5.8 (L) 6.5 - 8.1 g/dL   Albumin 3.2 (L) 3.5 - 5.0 g/dL   AST 20 15 - 41 U/L   ALT 26 17 - 63 U/L   Alkaline Phosphatase 91 38 - 126 U/L   Total Bilirubin 1.0 0.3 - 1.2 mg/dL   GFR calc non Af Amer 52 (L) >60 mL/min   GFR calc Af Amer 60 (L) >60 mL/min   Anion gap 11 5 - 15  Lipid panel     Status: Abnormal   Collection Time: 05/17/14  4:40 AM  Result Value Ref Range   Cholesterol 100 0 - 200 mg/dL   Triglycerides 50 <150 mg/dL   HDL 35 (L) >40 mg/dL   Total CHOL/HDL Ratio 2.9 RATIO   VLDL 10 0 - 40 mg/dL   LDL Cholesterol 55 0 -  99 mg/dL  Magnesium     Status: None   Collection Time: 05/17/14  4:40 AM  Result Value Ref Range   Magnesium 2.0 1.7 - 2.4 mg/dL  CBC     Status: Abnormal   Collection Time: 05/17/14  4:40 AM  Result Value Ref Range   WBC 11.4 (H) 4.0 - 10.5 K/uL   RBC 5.09 4.22 - 5.81 MIL/uL   Hemoglobin 12.1 (L) 13.0 - 17.0 g/dL   HCT 38.8 (L) 39.0 - 52.0 %   MCV 76.2 (L) 78.0 - 100.0 fL   MCH 23.8 (L) 26.0 - 34.0 pg   MCHC 31.2 30.0 - 36.0 g/dL   RDW 17.5 (H) 11.5 - 15.5 %   Platelets 179 150 - 400 K/uL  Heparin level (unfractionated)     Status: Abnormal   Collection Time: 05/17/14  4:40 AM  Result Value Ref Range   Heparin Unfractionated 0.12 (L) 0.30 - 0.70 IU/mL  Glucose, capillary     Status: Abnormal   Collection Time: 05/17/14  7:31 AM  Result Value Ref Range   Glucose-Capillary 207 (H) 70 - 99 mg/dL  Carboxyhemoglobin     Status: Abnormal   Collection Time: 05/17/14  8:45 AM  Result Value Ref Range   Total hemoglobin 11.0 (L) 13.5 - 18.0 g/dL   O2 Saturation 54.0 %   Carboxyhemoglobin 0.8 0.5 - 1.5 %   Methemoglobin 0.9 0.0 - 1.5 %  Heparin level (unfractionated)     Status: Abnormal   Collection Time: 05/17/14 12:00 PM  Result Value Ref Range   Heparin Unfractionated 0.25 (L) 0.30 - 0.70 IU/mL  Glucose, capillary     Status: Abnormal   Collection Time: 05/17/14 12:12 PM  Result Value Ref Range   Glucose-Capillary 279 (H) 70 - 99 mg/dL   Comment 1 Venous Specimen   Carboxyhemoglobin     Status: Abnormal   Collection Time: 05/17/14  1:05 PM  Result Value Ref  Range   Total hemoglobin 12.0 (L) 13.5 - 18.0 g/dL   O2 Saturation 63.4 %   Carboxyhemoglobin 1.2 0.5 - 1.5 %   Methemoglobin 1.0 0.0 - 1.5 %   Recent Results (from the past 240 hour(s))  MRSA PCR Screening     Status: None   Collection Time: 05/25/2014  9:36 PM  Result Value Ref Range Status   MRSA by PCR NEGATIVE NEGATIVE Final    Comment:        The GeneXpert MRSA Assay (FDA approved for NASAL specimens only),  is one component of a comprehensive MRSA colonization surveillance program. It is not intended to diagnose MRSA infection nor to guide or monitor treatment for MRSA infections.     Renal Function:  Recent Labs  05/13/2014 1730 05/15/14 0342 05/18/2014 0837 06/09/2014 2020 05/17/14 0155 05/17/14 0440  CREATININE 1.19 1.22 1.40* 1.29* 1.29* 1.34*   Estimated Creatinine Clearance: 56.3 mL/min (by C-G formula based on Cr of 1.34).  Radiologic Imaging: Dg Chest Port 1 View  05/17/2014   CLINICAL DATA:  Atrial line placement  EXAM: PORTABLE CHEST - 1 VIEW  COMPARISON:  All chest radiograph 05/26/2014  FINDINGS: Interval placement Swan-Ganz catheter with tip in the right main pulmonary artery. No pneumothorax. Cardiac silhouette is enlarged. There is mild interstitial edema pattern.  IMPRESSION: 1. Swan-Ganz catheter with tip in the right pulmonary artery. 2. Mild interstitial edema pattern with cardiomegaly. 3. No appreciable pneumothorax .   Electronically Signed   By: Suzy Bouchard M.D.   On: 05/20/2014 19:33   PROCEDURE:  After being prepped and draped in the usual sterile fashion, van buren sounds were used to sequentially dilate the meatus to 18 french with minimal difficulty.  16 french foley catheter then placed without difficulty.  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  Gross hematuria seen likely due to dilation trauma.  Patient also on Heparin which likely contributed.     - Continue foley catheter until ambulatory and can void on his own, otherwise change every 3-4 weeks  - Defer hematuria evaluation - may need workup in the future, may follow-up later for this  - Clabetasol cream for BXO, to be applied daily to tip of penis/foley  We will sign off at this time - please contact us with any further issues

## 2014-05-17 NOTE — Progress Notes (Signed)
Pt IABP systolic pressures 88-32. Ran CO and CI numbers and called MD. MD came to bedside assessed and placed new orders. Will continue to assess and monitor the pt closely.

## 2014-05-17 NOTE — Progress Notes (Signed)
Inpatient Diabetes Program Recommendations  AACE/ADA: New Consensus Statement on Inpatient Glycemic Control (2013)  Target Ranges:  Prepandial:   less than 140 mg/dL      Peak postprandial:   less than 180 mg/dL (1-2 hours)      Critically ill patients:  140 - 180 mg/dL  Results for JOHAAN, RYSER (MRN [id number]) as of 05/17/2014 11:14  Ref. Range 05/20/2014 07:29 05/26/2014 12:47 05/25/2014 21:34 05/17/2014 07:31  Glucose-Capillary Latest Ref Range: 70-99 mg/dL 244 (H) 277 (H) 286 (H) 207 (H)   Inpatient Diabetes Program Recommendations Insulin - Basal: add Lantus 10 units  HgbA1C: order to assess prehospital glucose control  Thank you  Raoul Pitch BSN, RN,CDE Inpatient Diabetes Coordinator [phone] (team pager)

## 2014-05-17 NOTE — Progress Notes (Signed)
Pt went into a 55 beat run of vtach. Cardiology MD Turner made aware. New orders initiated. Will continue to monitor.

## 2014-05-17 NOTE — Progress Notes (Signed)
  Echocardiogram 2D Echocardiogram limited with Definity has been performed.  Ion Gonnella 05/17/2014, 11:15 AM

## 2014-05-17 NOTE — Progress Notes (Addendum)
Pt went into vtach/torsades/vfib with brief loss of consciousness. Code called and CPR initiated and lasted less than 2 minutes. Pt was shocked 1 time and immediately returned to afib. 2G mag push and 300 amio bolus was given. CCM Dr in room and new orders placed and carried out. Will continue to monitor.

## 2014-05-17 NOTE — Progress Notes (Signed)
   05/17/14 1500  Clinical Encounter Type  Visited With Patient and family together;Health care provider  Visit Type Initial;Critical Care   Chaplain received a page to patient's room this morning. Chaplain was notified that the patient would like to complete an advanced directive. Patient was receiving medical attention when chaplain arrived but patient introduced himself to the patient's son and gave the patient's son a blank advanced directive. Chaplain went over the contents briefly with the patient's son. Patient's son said his father had been wanting to complete this paperwork. Later in the day chaplain was paged backed to patient's room and helped the patient complete the document. Patient's advanced directive is now signed and placed in patient's chart. Patient's family also was given copies.   Gar Ponto, Chaplain  3:27 PM

## 2014-05-17 NOTE — Progress Notes (Signed)
Advanced Heart Failure Rounding Note   Subjective:      Cardiac cath 5/6 with severe  3-v CAD abd cardiogenic shock physiology. IABP placed on started on milrinone.   Overnight with VF arrest requiring DC-CV. Started on amio with persistent ectopy. Lidocaine started which worked well. Off milrinone.   This am feels better. Denies dyspnea or CP. Less agitated. Nurses unable to get Foley in.   Swan numbers done personally at bedside RA 17 PA 49/18  PCWP 19 Thermo 2.4/1.2 SVR 3049 PVR 3.5 WU Co-ox 54%   Objective:   Weight Range:  Vital Signs:   Temp:  [96.1 F (35.6 C)-97.9 F (36.6 C)] 97.9 F (36.6 C) (05/06 1100) Pulse Rate:  [0-135] 67 (05/06 1100) Resp:  [0-33] 22 (05/06 1100) BP: (72-215)/(55-157) 130/94 mmHg (05/06 1100) SpO2:  [0 %-100 %] 100 % (05/06 1100) Weight:  [90.8 kg (200 lb 2.8 oz)] 90.8 kg (200 lb 2.8 oz) (05/06 0410) Last BM Date: 05/12/14  Weight change: Filed Weights   05/15/2014 0500 05/14/2014 1154 05/17/14 0410  Weight: 91.5 kg (201 lb 11.5 oz) 90.4 kg (199 lb 4.7 oz) 90.8 kg (200 lb 2.8 oz)    Intake/Output:   Intake/Output Summary (Last 24 hours) at 05/17/14 1205 Last data filed at 05/17/14 1100  Gross per 24 hour  Intake 1702.61 ml  Output   1100 ml  Net 602.61 ml     Physical Exam: General:  elderly appearing. Lying flat in bed No resp difficulty. Hard of hearing HEENT: normal Neck: supple. RIJ swan  Carotids 2+ bilat; no bruits. No lymphadenopathy or thryomegaly appreciated. Cor: PMI laterally displaced. Irregular rate & rhythm. +s3 2/6 MR Lungs: clear Abdomen: soft, nontender, nondistended. No hepatosplenomegaly. No bruits or masses. Good bowel sounds. Extremities: no cyanosis, clubbing, rash, tr edema Left groin IABP Righ groin candidiasis Neuro: alert & orientedx3, HOH moves all 4 extremities w/o difficulty. Affect pleasant  Telemetry: AF 60s   Labs: Basic Metabolic Panel:  Recent Labs Lab 05/13/2014 1730 05/15/14 0342  06/09/2014 0837 06/09/2014 2020 05/17/14 0155 05/17/14 0440  NA 141 140 138 139 139 137  K 4.1 3.8 4.1 3.3* 3.6 4.1  CL 111 112* 108 106 106 103  CO2 21* 21* 20* 23 24 23   GLUCOSE 212* 150* 280* 233* 255* 249*  BUN 33* 34* 40* 32* 31* 32*  CREATININE 1.19 1.22 1.40* 1.29* 1.29* 1.34*  CALCIUM 8.7* 8.5* 8.8* 9.0 8.5* 8.7*  MG 1.5*  --  1.6* 3.8*  --  2.0  PHOS  --   --   --  3.7  --   --     Liver Function Tests:  Recent Labs Lab 05/15/14 0342 06/02/2014 0837 05/17/14 0440  AST 16 20 20   ALT 23 27 26   ALKPHOS 88 97 91  BILITOT 0.9 0.7 1.0  PROT 6.3* 6.5 5.8*  ALBUMIN 3.4* 3.5 3.2*   No results for input(s): LIPASE, AMYLASE in the last 168 hours. No results for input(s): AMMONIA in the last 168 hours.  CBC:  Recent Labs Lab 05/19/2014 1630 05/15/14 0342 05/31/2014 0442 06/09/2014 2020 05/17/14 0440  WBC 9.1 9.7 11.1* 4.7 11.4*  NEUTROABS 7.1  --   --   --   --   HGB 13.0 12.4* 12.7* 13.6 12.1*  HCT 42.0 40.5 42.3 42.3 38.8*  MCV 78.1 78.0 77.6* 75.9* 76.2*  PLT 271 234 263 188 179    Cardiac Enzymes:  Recent Labs Lab 05/13/2014 1630 05/15/2014 1730 05/26/2014  2114 05/15/14 0342 05/15/14 0900  TROPONINI 0.40* 0.39* 0.45* 0.37* 0.35*    BNP: BNP (last 3 results)  Recent Labs  06/11/2014 1630  BNP 885.0*    ProBNP (last 3 results) No results for input(s): PROBNP in the last 8760 hours.    Other results:  Imaging: Dg Chest Port 1 View  05/22/2014   CLINICAL DATA:  Atrial line placement  EXAM: PORTABLE CHEST - 1 VIEW  COMPARISON:  All chest radiograph 06/04/2014  FINDINGS: Interval placement Swan-Ganz catheter with tip in the right main pulmonary artery. No pneumothorax. Cardiac silhouette is enlarged. There is mild interstitial edema pattern.  IMPRESSION: 1. Swan-Ganz catheter with tip in the right pulmonary artery. 2. Mild interstitial edema pattern with cardiomegaly. 3. No appreciable pneumothorax .   Electronically Signed   By: Suzy Bouchard M.D.   On:  05/28/2014 19:33      Medications:     Scheduled Medications: . amiodarone  150 mg Intravenous Once  . aspirin EC  81 mg Oral Daily  . atorvastatin  20 mg Oral QHS  . glipiZIDE  5 mg Oral BID AC  . insulin aspart  0-20 Units Subcutaneous TID WC  . insulin aspart  0-5 Units Subcutaneous QHS  . levothyroxine  50 mcg Oral QAC breakfast  . metoprolol succinate  25 mg Oral BID  . multivitamin with minerals  1 tablet Oral Daily  . nitroGLYCERIN  1 inch Topical 4 times per day  . omega-3 acid ethyl esters  1,000 mg Oral Daily  . sodium chloride  3 mL Intravenous Q12H  . sodium chloride  3 mL Intravenous Q12H  . sodium chloride  3 mL Intravenous Q12H     Infusions: . sodium chloride 100 mL/hr (05/24/2014 1150)  . amiodarone 60 mg/hr (05/17/14 1003)  . DOBUTamine 2.5 mcg/kg/min (05/17/14 0907)  . heparin 1,200 Units/hr (05/17/14 0535)  . lidocaine 2 mg/min (05/17/14 0222)     PRN Medications:  sodium chloride, sodium chloride, acetaminophen, morphine injection, ondansetron (ZOFRAN) IV, perflutren lipid microspheres (DEFINITY) IV suspension, sodium chloride, sodium chloride, zolpidem   Assessment:   1. Cardiogenic arrest 2. Ventricular fibrillation arrest 5/6 3. Acute systolic HF with severe biventricular dysfunction EF 25%     --s/p IABP placement 5/6 4. 3v CAD 5. DM2 6. PAF with RVR   Plan/Discussion:    Filling pressures improving but had profound cardiogenic shock despite IABP support. Unable to tolerate milrinone due to VF. Will try dobutamine carefully. Add low-dose hydralazine for further afterload reduction as tolerated. Continue lidocaine and amio. Watch electrolytes.   RA/PCWP ratio very concerning for severe RV dysfunction. Will continue to try and diurese slowly. I have asked Urology to place Foley.   Have d/w with Dr. Prescott Gum regarding very high risk CABG. Will need to optimize hemodynamics first. With RV dysfunction not a candidate for VAD at this point.    Continue heparin for IABP  Continue SSI.   The patient is critically ill with multiple organ systems failure and requires high complexity decision making for assessment and support, frequent evaluation and titration of therapies, application of advanced monitoring technologies and extensive interpretation of multiple databases.   Critical Care Time devoted to patient care services described in this note is 45 Minutes.    Length of Stay: 3  Sereen Schaff MD 05/17/2014, 12:05 PM  Advanced Heart Failure Team Pager [phone] (M-F; 7a - 4p)  Please contact [facility] for night-coverage after hours (4p -7a ) and weekends  on [email or web address]

## 2014-05-17 NOTE — Progress Notes (Signed)
ANTICOAGULATION CONSULT NOTE - Follow Up Consult  Pharmacy Consult for Heparin Indication: IABP  Allergies  Allergen Reactions  . Penicillins Rash    Patient Measurements: Height: 5\' 9"  (175.3 cm) Weight: 200 lb 2.8 oz (90.8 kg) IBW/kg (Calculated) : 70.7 Heparin Dosing Weight: 88 kg  Vital Signs: Temp: 97 F (36.1 C) (05/06 1800) Temp Source: Core (Comment) (05/06 1600) BP: 108/54 mmHg (05/06 1800) Pulse Rate: 61 (05/06 1800)  Labs:  Recent Labs  05/30/2014 2114  05/15/14 0342  05/15/14 0900  05/21/2014 0442  06/09/2014 0837 06/10/2014 2020 05/17/14 0155 05/17/14 0440 05/17/14 1200 05/17/14 1800  HGB  --   < > 12.4*  --   --   --  12.7*  --   --  13.Jeffrey  --  12.1*  --   --   HCT  --   < > 40.5  --   --   --  42.3  --   --  42.3  --  38.8*  --   --   PLT  --   < > 234  --   --   --  263  --   --  188  --  179  --   --   LABPROT  --   --   --   --   --   --   --   --  16.3*  --   --   --   --   --   INR  --   --   --   --   --   --   --   --  1.30  --   --   --   --   --   HEPARINUNFRC  --   --   --   < >  --   < > 0.78*  < >  --   --   --  0.12* 0.25* 0.32  CREATININE  --   < > 1.22  --   --   --   --   --  1.40* 1.29* 1.29* 1.34*  --   --   TROPONINI 0.45*  --  0.37*  --  0.35*  --   --   --   --   --   --   --   --   --   < > = values in this interval not displayed.  Estimated Creatinine Clearance: 56.3 mL/min (by C-G formula based on Cr of 1.34).   Medications:   Infusions:  . sodium chloride 100 mL/hr (05/19/2014 1150)  . amiodarone 60 mg/hr (05/17/14 1542)  . DOBUTamine 5 mcg/kg/min (05/17/14 1545)  . heparin 1,200 Units/hr (05/17/14 1500)  . lidocaine 2 mg/min (05/17/14 1704)  . norepinephrine (LEVOPHED) Adult infusion      Assessment: 74 yo Jeffrey [name] admitted with afib/NSTEMI, now s/p cath awaiting CVTS consult on IABP.  IV heparin started for IABP.  Current heparin level therapeutic on heparin 1200 units/hr.  No bleeding or complications noted.  Goal of Therapy:   Heparin level 0.2-0.5 while on IABP Monitor platelets by anticoagulation protocol: Yes   Plan:  1. Continue IV heparin at current rate. 2. Continue daily heparin level and CBC.  Thank you. Jeffrey Jeffrey [name], PharmD [phone]   5/Jeffrey/2016 7:16 PM

## 2014-05-17 NOTE — Progress Notes (Signed)
Pt having more frequent runs of vtach. Cardiology MD Turner made aware. New orders placed and initiated. Will continue to monitor.

## 2014-05-17 NOTE — Progress Notes (Signed)
ANTICOAGULATION CONSULT NOTE - Follow Up Consult  Pharmacy Consult for heparin Indication: IABP   Labs:  Recent Labs  06/10/2014 2114 05/15/14 0342  05/15/14 0900  06/09/2014 0442 06/03/2014 0515 05/25/2014 0837 05/12/2014 2020 05/17/14 0155 05/17/14 0440  HGB  --  12.4*  --   --   --  12.7*  --   --  13.6  --  12.1*  HCT  --  40.5  --   --   --  42.3  --   --  42.3  --  38.8*  PLT  --  234  --   --   --  263  --   --  188  --  PENDING  LABPROT  --   --   --   --   --   --   --  16.3*  --   --   --   INR  --   --   --   --   --   --   --  1.30  --   --   --   HEPARINUNFRC  --   --   < >  --   < > 0.78* 0.28*  --   --   --  0.12*  CREATININE  --  1.22  --   --   --   --   --  1.40* 1.29* 1.29*  --   TROPONINI 0.45* 0.37*  --  0.35*  --   --   --   --   --   --   --   < > = values in this interval not displayed.   Assessment: 74yo male subtherapeutic on heparin after resumed at low rate after IABP placement.  Goal of Therapy:  Heparin level 0.2-0.5 units/ml   Plan:  Will increase heparin gtt by 1-2 units/kg/hr to 1200 units/hr which previously resulted in level of 0.28 and check level in 6hr.  Wynona Neat, PharmD, BCPS  05/17/2014,5:35 AM

## 2014-05-17 NOTE — Progress Notes (Signed)
Pt BP 76/56. IABP BP is 67/40. Pt lethargic and pale with some lower extremity edema.MD paged and notified of findings. Orders placed to start Levophed drip and get a STAT CBC and BMET. Orders initiated and reported to oncoming nurse. Night shift RN informed to notify MD of any changes.

## 2014-05-17 NOTE — Progress Notes (Signed)
ANTICOAGULATION CONSULT NOTE - Follow Up Consult  Pharmacy Consult for Heparin Indication: IABP  Allergies  Allergen Reactions  . Penicillins Rash    Patient Measurements: Height: 5\' 9"  (175.3 cm) Weight: 200 lb 2.8 oz (90.8 kg) IBW/kg (Calculated) : 70.7 Heparin Dosing Weight: 88 kg  Vital Signs: Temp: 97.7 F (36.5 C) (05/06 1300) Temp Source: Core (Comment) (05/06 1200) BP: 136/88 mmHg (05/06 1300) Pulse Rate: 61 (05/06 1300)  Labs:  Recent Labs  06/08/2014 2114 05/15/14 0342  05/15/14 0900  06/09/2014 0442 06/04/2014 0515 05/19/2014 0837 05/22/2014 2020 05/17/14 0155 05/17/14 0440 05/17/14 1200  HGB  --  12.4*  --   --   --  12.7*  --   --  13.6  --  12.1*  --   HCT  --  40.5  --   --   --  42.3  --   --  42.3  --  38.8*  --   PLT  --  234  --   --   --  263  --   --  188  --  179  --   LABPROT  --   --   --   --   --   --   --  16.3*  --   --   --   --   INR  --   --   --   --   --   --   --  1.30  --   --   --   --   HEPARINUNFRC  --   --   < >  --   < > 0.78* 0.28*  --   --   --  0.12* 0.25*  CREATININE  --  1.22  --   --   --   --   --  1.40* 1.29* 1.29* 1.34*  --   TROPONINI 0.45* 0.37*  --  0.35*  --   --   --   --   --   --   --   --   < > = values in this interval not displayed.  Estimated Creatinine Clearance: 56.3 mL/min (by C-G formula based on Cr of 1.34).   Medications:   Infusions:  . sodium chloride 100 mL/hr (05/20/2014 1150)  . amiodarone 60 mg/hr (05/17/14 1003)  . DOBUTamine 2.5 mcg/kg/min (05/17/14 0907)  . heparin 1,200 Units/hr (05/17/14 0535)  . lidocaine 2 mg/min (05/17/14 0222)    Assessment: 74 yo male admitted with afib/NSTEMI, now s/p cath awaiting CVTS consult on IABP.  IV heparin started for IABP.  Current heparin level therapeutic on heparin 1200 units/hr.  No bleeding or complications noted.  Goal of Therapy:  Heparin level 0.2-0.5 while on IABP Monitor platelets by anticoagulation protocol: Yes   Plan:  1. Continue IV heparin  at current rate. 2. Confirm heparin level in 6 hrs. 3. Continue daily heparin level and CBC.  Uvaldo Rising, BCPS  Clinical Pharmacist Pager [phone]  05/17/2014 1:43 PM

## 2014-05-17 NOTE — Progress Notes (Signed)
56F coude catheter was unable to be inserted by rehab nurses. I attempted a 71F foley catheter but was unable to insert it as well. Pt is able to tell me when he needs to void and has been using a urinal easily all night. Will continue to monitor.

## 2014-05-17 NOTE — Care Management Note (Signed)
Case Management Note  Patient Details  Name: Jeffrey [name] MRN: [id number] Date of Birth: [birth year]  Subjective/Objective:    Adm w nstemi                Action/Plan:lives alone, pcp dr Elsie Lincoln   Expected Discharge Date:  05/17/14               Expected Discharge Plan:  Home/Self Care  In-House Referral:  NA  Discharge planning Services  CM Consult, Medication Assistance, [facility]  Post Acute Care Choice:  NA Choice offered to:  NA  DME Arranged:    DME Agency:     HH Arranged:    HH Agency:     Status of Service:  In process, will continue to follow  Medicare Important Message Given:  Yes Date Medicare IM Given:  05/17/14 Medicare IM give by:  debbie Katelynd Blauvelt [email or web address] Date Additional Medicare IM Given:    Additional Medicare Important Message give by:     If discussed at Combine of Stay Meetings, dates discussed:    Additional Comments:  Lacretia Leigh, RN 05/17/2014, 10:45 AM

## 2014-05-17 NOTE — Progress Notes (Signed)
Will keep amio at 60mg /hr due to patients unstable condition per Cardiology MD Lovena Le. Will continue to monitor.

## 2014-05-17 NOTE — Progress Notes (Deleted)
Pt went into vtach/torasdes/vfib with initial loss of consciousness. CPR initiated and lasted for less than 2 minutes. Pt was shocked 1 time and returned to afib. 2mg  Mag and 300 amio bolus given. CCM MD in room during code, new orders placed and initiated. Will continue to monitor.

## 2014-05-18 LAB — GLUCOSE, CAPILLARY
GLUCOSE-CAPILLARY: 121 mg/dL — AB (ref 70–99)
GLUCOSE-CAPILLARY: 170 mg/dL — AB (ref 70–99)
GLUCOSE-CAPILLARY: 172 mg/dL — AB (ref 70–99)
Glucose-Capillary: 151 mg/dL — ABNORMAL HIGH (ref 70–99)

## 2014-05-18 LAB — CARBOXYHEMOGLOBIN
Carboxyhemoglobin: 0.8 % (ref 0.5–1.5)
Methemoglobin: 1 % (ref 0.0–1.5)
O2 Saturation: 63.8 %
TOTAL HEMOGLOBIN: 11.5 g/dL — AB (ref 13.5–18.0)

## 2014-05-18 LAB — CBC
HCT: 35.4 % — ABNORMAL LOW (ref 39.0–52.0)
Hemoglobin: 11.2 g/dL — ABNORMAL LOW (ref 13.0–17.0)
MCH: 23.8 pg — AB (ref 26.0–34.0)
MCHC: 31.6 g/dL (ref 30.0–36.0)
MCV: 75.2 fL — ABNORMAL LOW (ref 78.0–100.0)
Platelets: 125 10*3/uL — ABNORMAL LOW (ref 150–400)
RBC: 4.71 MIL/uL (ref 4.22–5.81)
RDW: 17.4 % — ABNORMAL HIGH (ref 11.5–15.5)
WBC: 10.5 10*3/uL (ref 4.0–10.5)

## 2014-05-18 LAB — BASIC METABOLIC PANEL
Anion gap: 8 (ref 5–15)
Anion gap: 9 (ref 5–15)
BUN: 33 mg/dL — ABNORMAL HIGH (ref 6–20)
BUN: 29 mg/dL — ABNORMAL HIGH (ref 6–20)
CALCIUM: 8 mg/dL — AB (ref 8.9–10.3)
CO2: 22 mmol/L (ref 22–32)
CO2: 22 mmol/L (ref 22–32)
Calcium: 8.1 mg/dL — ABNORMAL LOW (ref 8.9–10.3)
Chloride: 100 mmol/L — ABNORMAL LOW (ref 101–111)
Chloride: 99 mmol/L — ABNORMAL LOW (ref 101–111)
Creatinine, Ser: 1.5 mg/dL — ABNORMAL HIGH (ref 0.61–1.24)
Creatinine, Ser: 1.58 mg/dL — ABNORMAL HIGH (ref 0.61–1.24)
GFR calc Af Amer: 52 mL/min — ABNORMAL LOW (ref >60)
GFR calc Af Amer: 49 mL/min — ABNORMAL LOW (ref >60)
GFR calc non Af Amer: 45 mL/min — ABNORMAL LOW (ref >60)
GFR calc non Af Amer: 42 mL/min — ABNORMAL LOW (ref >60)
Glucose, Bld: 145 mg/dL — ABNORMAL HIGH (ref 70–99)
Glucose, Bld: 185 mg/dL — ABNORMAL HIGH (ref 70–99)
POTASSIUM: 3.8 mmol/L (ref 3.5–5.1)
Potassium: 4.3 mmol/L (ref 3.5–5.1)
SODIUM: 130 mmol/L — AB (ref 135–145)
Sodium: 130 mmol/L — ABNORMAL LOW (ref 135–145)

## 2014-05-18 LAB — MAGNESIUM: MAGNESIUM: 1.7 mg/dL (ref 1.7–2.4)

## 2014-05-18 LAB — HEPARIN LEVEL (UNFRACTIONATED): Heparin Unfractionated: 0.25 IU/mL — ABNORMAL LOW (ref 0.30–0.70)

## 2014-05-18 MED ORDER — FUROSEMIDE 10 MG/ML IJ SOLN
40.0000 mg | Freq: Three times a day (TID) | INTRAMUSCULAR | Status: DC
  Administered 2014-05-18 – 2014-05-19 (×3): 40 mg via INTRAVENOUS
  Filled 2014-05-18 (×6): qty 4

## 2014-05-18 MED ORDER — MAGNESIUM SULFATE 2 GM/50ML IV SOLN
2.0000 g | Freq: Once | INTRAVENOUS | Status: AC
  Administered 2014-05-18: 2 g via INTRAVENOUS

## 2014-05-18 MED ORDER — POTASSIUM CHLORIDE CRYS ER 20 MEQ PO TBCR
40.0000 meq | EXTENDED_RELEASE_TABLET | Freq: Two times a day (BID) | ORAL | Status: DC
  Administered 2014-05-18 – 2014-05-22 (×9): 40 meq via ORAL
  Filled 2014-05-18 (×12): qty 2

## 2014-05-18 MED ORDER — ATORVASTATIN CALCIUM 80 MG PO TABS
80.0000 mg | ORAL_TABLET | Freq: Every day | ORAL | Status: DC
  Administered 2014-05-18 – 2014-05-22 (×5): 80 mg via ORAL
  Filled 2014-05-18 (×6): qty 1

## 2014-05-18 NOTE — Progress Notes (Signed)
ANTICOAGULATION CONSULT NOTE - Follow Up Consult  Pharmacy Consult for Heparin Indication: IABP  Allergies  Allergen Reactions  . Penicillins Rash    Patient Measurements: Height: 5\' 9"  (175.3 cm) Weight: 199 lb 4.7 oz (90.4 kg) IBW/kg (Calculated) : 70.7 Heparin Dosing Weight: 88 kg  Vital Signs: Temp: 96.8 F (36 C) (05/07 0800) Temp Source: Core (Comment) (05/07 0800) BP: 137/91 mmHg (05/07 0800) Pulse Rate: 64 (05/07 0800)  Labs:  Recent Labs  06/10/2014 0837  05/17/14 0440 05/17/14 1200 05/17/14 1800 05/17/14 1913 05/18/14 0400  HGB  --   < > 12.1*  --   --  11.9* 11.2*  HCT  --   < > 38.8*  --   --  36.9* 35.4*  PLT  --   < > 179  --   --  129* 125*  LABPROT 16.3*  --   --   --   --   --   --   INR 1.30  --   --   --   --   --   --   HEPARINUNFRC  --   --  0.12* 0.25* 0.32  --  0.25*  CREATININE 1.40*  < > 1.34*  --   --  1.50* 1.50*  < > = values in this interval not displayed.  Estimated Creatinine Clearance: 50.2 mL/min (by C-G formula based on Cr of 1.5).   Medications:   Infusions:  . sodium chloride 100 mL/hr (06/10/2014 1150)  . amiodarone 30 mg/hr (05/18/14 0700)  . DOBUTamine 5 mcg/kg/min (05/17/14 2000)  . heparin 1,200 Units/hr (05/17/14 2000)  . lidocaine 1 mg/min (05/18/14 0700)  . norepinephrine (LEVOPHED) Adult infusion Stopped (05/18/14 0440)    Assessment: 74 yo male admitted with afib/NSTEMI, now s/p cath.  IV heparin started for IABP.    Goal HL: 0.2-0.5.  Today's heparin level is therapeutic at 0.25.  Will continue current infusion rate.    Hgb 11.2, Pltc 125 (baseline 234) trending down will continue to watch trend.  No bleeding or complications noted  Goal of Therapy:  Heparin level 0.2-0.5 while on IABP Monitor platelets by anticoagulation protocol: Yes   Plan:  1. Continue Heparin at 1200 units/hr.   2. Continue daily heparin level and CBC. 3. Monitor for signs and symptoms of bleeding  Hassie Bruce, Pharm. D. Clinical  Pharmacy Resident Pager: [phone] Ph: [phone] 05/18/2014 9:18 AM

## 2014-05-18 NOTE — Progress Notes (Signed)
Patient ID: Jeffrey [name], male   DOB: 1940-01-13, 74 y.o.   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 IABP placed, on started on milrinone.  He than had VF arrest requiring DC-CV. Started on amio with persistent ectopy. Lidocaine started which worked well. Off milrinone. Dobutamine begun 5/6.    No arrhythmias overnight. Was on norepinephrine yesterday for a period but off today with SBP in 110s. Awake/alert, denies dyspnea/chest pain.    Swan numbers done personally at bedside RA 17 PA 46/26  PCWP 26 Thermo 1.2 Co-ox 61% => 64%   Objective:   Weight Range:  Vital Signs:   Temp:  [96.6 F (35.9 C)-97.9 F (36.6 C)] 96.8 F (36 C) (05/07 0600) Pulse Rate:  [58-77] 68 (05/07 0600) Resp:  [15-25] 22 (05/07 0600) BP: (76-140)/(54-109) 112/66 mmHg (05/07 0600) SpO2:  [96 %-100 %] 96 % (05/07 0600) Weight:  [199 lb 4.7 oz (90.4 kg)] 199 lb 4.7 oz (90.4 kg) (05/07 0405) Last BM Date: 05/12/14  Weight change: Filed Weights   05/13/2014 1154 05/17/14 0410 05/18/14 0405  Weight: 199 lb 4.7 oz (90.4 kg) 200 lb 2.8 oz (90.8 kg) 199 lb 4.7 oz (90.4 kg)    Intake/Output:   Intake/Output Summary (Last 24 hours) at 05/18/14 5465 Last data filed at 05/18/14 0600  Gross per 24 hour  Intake 2954.37 ml  Output    955 ml  Net 1999.37 ml     Physical Exam: General:  elderly appearing. Lying flat in bed No resp difficulty. Hard of hearing HEENT: normal Neck: supple. RIJ swan  Carotids 2+ bilat; no bruits. No lymphadenopathy or thryomegaly appreciated. Cor: PMI laterally displaced. Irregular rate & rhythm. +s3 2/6 MR Lungs: clear Abdomen: soft, nontender, nondistended. No hepatosplenomegaly. No bruits or masses. Good bowel sounds. Extremities: no cyanosis, clubbing, rash, tr edema Left groin IABP Righ groin candidiasis Neuro: alert  & orientedx3, HOH moves all 4 extremities w/o difficulty. Affect pleasant  Telemetry: AF 60s   Labs: Basic Metabolic Panel:  Recent Labs Lab 05/31/2014 1730  05/25/2014 0837 05/14/2014 2020 05/17/14 0155 05/17/14 0440 05/17/14 1913 05/18/14 0400  NA 141  < > 138 139 139 137 132* 130*  K 4.1  < > 4.1 3.3* 3.6 4.1 4.3 3.8  CL 111  < > 108 106 106 103 101 100*  CO2 21*  < > 20* 23 24 23 22 22   GLUCOSE 212*  < > 280* 233* 255* 249* 275* 145*  BUN 33*  < > 40* 32* 31* 32* 34* 33*  CREATININE 1.19  < > 1.40* 1.29* 1.29* 1.34* 1.50* 1.50*  CALCIUM 8.7*  < > 8.8* 9.0 8.5* 8.7* 8.2* 8.1*  MG 1.5*  --  1.6* 3.8*  --  2.0  --  1.7  PHOS  --   --   --  3.7  --   --   --   --   < > = values in this interval not displayed.  Liver Function Tests:  Recent Labs Lab 05/15/14 0342 05/30/2014 0837 05/17/14 0440  AST 16 20 20   ALT 23 27 26   ALKPHOS 88 97 91  BILITOT 0.9 0.7 1.0  PROT 6.3* 6.5 5.8*  ALBUMIN 3.4* 3.5 3.2*   No results for input(s): LIPASE, AMYLASE in the last 168 hours. No results for input(s): AMMONIA in the  last 168 hours.  CBC:  Recent Labs Lab 06/03/2014 1630  06/04/2014 0442 05/12/2014 2020 05/17/14 0440 05/17/14 1913 05/18/14 0400  WBC 9.1  < > 11.1* 4.7 11.4* 9.7 10.5  NEUTROABS 7.1  --   --   --   --   --   --   HGB 13.0  < > 12.7* 13.6 12.1* 11.9* 11.2*  HCT 42.0  < > 42.3 42.3 38.8* 36.9* 35.4*  MCV 78.1  < > 77.6* 75.9* 76.2* 75.6* 75.2*  PLT 271  < > 263 188 179 129* 125*  < > = values in this interval not displayed.  Cardiac Enzymes:  Recent Labs Lab 06/11/2014 1630 05/31/2014 1730 05/24/2014 2114 05/15/14 0342 05/15/14 0900  TROPONINI 0.40* 0.39* 0.45* 0.37* 0.35*    BNP: BNP (last 3 results)  Recent Labs  05/19/2014 1630  BNP 885.0*    ProBNP (last 3 results) No results for input(s): PROBNP in the last 8760 hours.    Other results:  Imaging: Dg Chest Port 1 View  06/10/2014   CLINICAL DATA:  Atrial line placement  EXAM: PORTABLE CHEST - 1  VIEW  COMPARISON:  All chest radiograph 05/25/2014  FINDINGS: Interval placement Swan-Ganz catheter with tip in the right main pulmonary artery. No pneumothorax. Cardiac silhouette is enlarged. There is mild interstitial edema pattern.  IMPRESSION: 1. Swan-Ganz catheter with tip in the right pulmonary artery. 2. Mild interstitial edema pattern with cardiomegaly. 3. No appreciable pneumothorax .   Electronically Signed   By: Suzy Bouchard M.D.   On: 05/21/2014 19:33     Medications:     Scheduled Medications: . amiodarone  150 mg Intravenous Once  . aspirin EC  81 mg Oral Daily  . atorvastatin  80 mg Oral QHS  . furosemide  40 mg Intravenous Q8H  . glipiZIDE  5 mg Oral BID AC  . hydrALAZINE  12.5 mg Oral TID  . insulin aspart  0-20 Units Subcutaneous TID WC  . insulin aspart  0-5 Units Subcutaneous QHS  . insulin glargine  10 Units Subcutaneous QHS  . levothyroxine  50 mcg Oral QAC breakfast  . multivitamin with minerals  1 tablet Oral Daily  . sodium chloride  3 mL Intravenous Q12H  . sodium chloride  3 mL Intravenous Q12H  . sodium chloride  3 mL Intravenous Q12H    Infusions: . sodium chloride 100 mL/hr (06/03/2014 1150)  . amiodarone 60 mg/hr (05/18/14 0404)  . DOBUTamine 5 mcg/kg/min (05/17/14 2000)  . heparin 1,200 Units/hr (05/17/14 2000)  . lidocaine 2 mg/min (05/18/14 0156)  . norepinephrine (LEVOPHED) Adult infusion Stopped (05/18/14 0440)    PRN Medications: sodium chloride, sodium chloride, acetaminophen, morphine injection, ondansetron (ZOFRAN) IV, sodium chloride, sodium chloride, zolpidem   Assessment:   1. Cardiogenic shock 2. Ventricular fibrillation arrest 5/6 in setting of milrinone use 3. 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 3v CAD: Occluded RCA, subtotally occluded prox LAD, 90% LCx 5. DM2 6. Atrial fibrillation   Plan/Discussion:    Elevated filling  pressures this morning.  CI 1.2 by thermodilution but co-ox has been relatively good at 61% and 64% last 2 checks.  Patient looks reasonably good also, think I am going to consider the co-ox as more accurate for true reflection of cardiac output here.  Unable to tolerate milrinone due to VF.  Tolerating dobutamine at 5.   - IABP to remain in,  1:1.  Hemoglobin ok.  Heparin gtt on.  - Continue dobutamine @ 5 - No further arrhythmias: decrease amiodarone to 30 mg/hr and lidocaine to 1 mg/min to limit negative inotropy.  - Add Lasix 40 mg IV every 8 hrs today.   RA/PCWP ratio very concerning for severe RV dysfunction (0.65 this morning). Will continue to try and diurese slowly.  Eventual goal will be high risk CABG. Will need to optimize hemodynamics first. With RV dysfunction not a candidate for VAD at this point.   Continue heparin for IABP/atrial fibrillation.  HR controlled in afib this morning.   Continue SSI.   The patient is critically ill with multiple organ systems failure and requires high complexity decision making for assessment and support, frequent evaluation and titration of therapies, application of advanced monitoring technologies and extensive interpretation of multiple databases.   Critical Care Time devoted to patient care services described in this note is 45 Minutes.   Length of Stay: 4  Loralie Champagne MD 05/18/2014, 7:02 AM  Advanced Heart Failure Team Pager [phone] (M-F; 7a - 4p)  Please contact [facility] for night-coverage after hours (4p -7a ) and weekends on [email or web address]

## 2014-05-19 ENCOUNTER — Inpatient Hospital Stay (HOSPITAL_COMMUNITY): Payer: Commercial Managed Care - HMO

## 2014-05-19 DIAGNOSIS — I509 Heart failure, unspecified: Secondary | ICD-10-CM

## 2014-05-19 LAB — GLUCOSE, CAPILLARY
GLUCOSE-CAPILLARY: 68 mg/dL — AB (ref 70–99)
GLUCOSE-CAPILLARY: 150 mg/dL — AB (ref 70–99)
GLUCOSE-CAPILLARY: 147 mg/dL — AB (ref 70–99)
GLUCOSE-CAPILLARY: 135 mg/dL — AB (ref 70–99)
Glucose-Capillary: 102 mg/dL — ABNORMAL HIGH (ref 70–99)

## 2014-05-19 LAB — CBC
HCT: 36.6 % — ABNORMAL LOW (ref 39.0–52.0)
Hemoglobin: 11.7 g/dL — ABNORMAL LOW (ref 13.0–17.0)
MCH: 23.7 pg — ABNORMAL LOW (ref 26.0–34.0)
MCHC: 32 g/dL (ref 30.0–36.0)
MCV: 74.2 fL — ABNORMAL LOW (ref 78.0–100.0)
Platelets: 100 10*3/uL — ABNORMAL LOW (ref 150–400)
RBC: 4.93 MIL/uL (ref 4.22–5.81)
RDW: 17.2 % — ABNORMAL HIGH (ref 11.5–15.5)
WBC: 8.9 10*3/uL (ref 4.0–10.5)

## 2014-05-19 LAB — APTT
APTT: 53 s — AB (ref 24–37)
APTT: 64 s — AB (ref 24–37)
APTT: 66 s — AB (ref 24–37)

## 2014-05-19 LAB — BASIC METABOLIC PANEL
ANION GAP: 9 (ref 5–15)
BUN: 25 mg/dL — AB (ref 6–20)
CO2: 26 mmol/L (ref 22–32)
CREATININE: 1.38 mg/dL — AB (ref 0.61–1.24)
Calcium: 8.4 mg/dL — ABNORMAL LOW (ref 8.9–10.3)
Chloride: 99 mmol/L — ABNORMAL LOW (ref 101–111)
GFR calc non Af Amer: 50 mL/min — ABNORMAL LOW (ref >60)
GFR, EST AFRICAN AMERICAN: 58 mL/min — AB (ref >60)
Glucose, Bld: 80 mg/dL (ref 70–99)
Potassium: 3.9 mmol/L (ref 3.5–5.1)
Sodium: 134 mmol/L — ABNORMAL LOW (ref 135–145)

## 2014-05-19 LAB — CARBOXYHEMOGLOBIN
Carboxyhemoglobin: 1.1 % (ref 0.5–1.5)
Methemoglobin: 1 % (ref 0.0–1.5)
O2 Saturation: 78.9 %
TOTAL HEMOGLOBIN: 11.9 g/dL — AB (ref 13.5–18.0)

## 2014-05-19 LAB — MAGNESIUM: Magnesium: 1.7 mg/dL (ref 1.7–2.4)

## 2014-05-19 LAB — HEPARIN LEVEL (UNFRACTIONATED): Heparin Unfractionated: 0.27 IU/mL — ABNORMAL LOW (ref 0.30–0.70)

## 2014-05-19 MED ORDER — PERFLUTREN LIPID MICROSPHERE
INTRAVENOUS | Status: AC
  Filled 2014-05-19: qty 10

## 2014-05-19 MED ORDER — CETYLPYRIDINIUM CHLORIDE 0.05 % MT LIQD
7.0000 mL | Freq: Two times a day (BID) | OROMUCOSAL | Status: DC
  Administered 2014-05-20 – 2014-05-22 (×4): 7 mL via OROMUCOSAL

## 2014-05-19 MED ORDER — SODIUM CHLORIDE 0.9 % IV SOLN
0.1500 mg/kg/h | INTRAVENOUS | Status: DC
  Administered 2014-05-19 – 2014-05-20 (×2): 0.15 mg/kg/h via INTRAVENOUS
  Filled 2014-05-19 (×2): qty 250

## 2014-05-19 MED ORDER — FUROSEMIDE 40 MG PO TABS
40.0000 mg | ORAL_TABLET | Freq: Every day | ORAL | Status: DC
  Administered 2014-05-19 – 2014-05-22 (×3): 40 mg via ORAL
  Filled 2014-05-19 (×5): qty 1

## 2014-05-19 MED ORDER — HEPARIN (PORCINE) IN NACL 100-0.45 UNIT/ML-% IJ SOLN: 1250.0000 [IU]/h | INTRAMUSCULAR | Status: DC

## 2014-05-19 MED ORDER — MAGNESIUM SULFATE 2 GM/50ML IV SOLN
2.0000 g | Freq: Once | INTRAVENOUS | Status: AC
  Administered 2014-05-19: 2 g via INTRAVENOUS
  Filled 2014-05-19: qty 50

## 2014-05-19 MED ORDER — PERFLUTREN LIPID MICROSPHERE
1.0000 mL | INTRAVENOUS | Status: AC | PRN
  Administered 2014-05-19: 2 mL via INTRAVENOUS
  Filled 2014-05-19: qty 10

## 2014-05-19 NOTE — Progress Notes (Signed)
  Echocardiogram 2D Echocardiogram has been performed.  Darlina Sicilian M 05/19/2014, 8:37 AM

## 2014-05-19 NOTE — Progress Notes (Addendum)
ANTICOAGULATION CONSULT NOTE - Follow Up Consult  Pharmacy Consult for Heparin Indication: IABP  Allergies  Allergen Reactions  . Penicillins Rash    Patient Measurements: Height: 5\' 9"  (175.3 cm) Weight: 194 lb 10.7 oz (88.3 kg) IBW/kg (Calculated) : 70.7 Heparin Dosing Weight: 88 kg  Vital Signs: Temp: 97.2 F (36.2 C) (05/08 0708) Temp Source: Core (Comment) (05/08 0500) BP: 100/41 mmHg (05/08 0721) Pulse Rate: 76 (05/08 0708)  Labs:  Recent Labs  05/21/2014 0837  05/17/14 1800 05/17/14 1913 05/18/14 0400 05/18/14 1623 05/19/14 0558  HGB  --   < >  --  11.9* 11.2*  --  11.7*  HCT  --   < >  --  36.9* 35.4*  --  36.6*  PLT  --   < >  --  129* 125*  --  PENDING  LABPROT 16.3*  --   --   --   --   --   --   INR 1.30  --   --   --   --   --   --   HEPARINUNFRC  --   < > 0.32  --  0.25*  --  0.27*  CREATININE 1.40*  < >  --  1.50* 1.50* 1.58* 1.38*  < > = values in this interval not displayed.  Estimated Creatinine Clearance: 54 mL/min (by C-G formula based on Cr of 1.38).   Medications:   Infusions:  . amiodarone 30 mg/hr (05/19/14 0012)  . DOBUTamine 5 mcg/kg/min (05/18/14 2212)  . heparin 1,200 Units/hr (05/18/14 1900)  . lidocaine 1 mg/min (05/19/14 0321)  . norepinephrine (LEVOPHED) Adult infusion Stopped (05/18/14 0440)    Assessment: 74 yo male admitted with afib/NSTEMI, now s/p cath.  IV heparin started for IABP.    Goal HL: 0.2-0.5.  Today's heparin level is therapeutic at 0.27.  Will bump rate slightly to keep within middle of goal.    Hgb 11.2, Pltc 100 (baseline 234) continues to trending down will continue to watch trend.  No bleeding or complications noted  Goal of Therapy:  Heparin level 0.2-0.5 while on IABP Monitor platelets by anticoagulation protocol: Yes   Plan:  1. Increase Heparin slightly to1250 units/hr to keep in midrange of goal  2. Continue daily heparin level and CBC. 3. Monitor for signs and symptoms of bleeding  Hassie Bruce, Pharm. D. Clinical Pharmacy Resident Pager: [phone] Ph: [phone] 05/19/2014 7:59 AM  ADDENDUM  Patient suspected for possible HIT.  Admitting platelet at 271, now at 100 after 8 days, indicating a drop > 50% of platelets over 5-10 days.  Heparin will be transitioned to bivalirudin at this time.  Of note, patient has an IABP placed, which may contribute to the decrease in platelets.  No other records of heparin exposure in EMR.  Will order HIT panel per Dr. Aundra Dubin.  Patient is a high risk for bleeding, and therefore a more conservative dosing approach will be taken.     Plan - Discontinue heparin gtt - Initiate Bivalirudin at 0.15 mg/kg/hr, 1 hour after heparin infusion has stopped (based on renal function CrCl ~ 60 mL/min). - Follow up aPTT 2 hours after start of bivalirudin infusion, then every 4 hours after.    Hassie Bruce, Pharm. D. Clinical Pharmacy Resident Pager: [phone] Ph: [phone] 05/19/2014 8:54 AM

## 2014-05-19 NOTE — Progress Notes (Signed)
ANTICOAGULATION CONSULT NOTE - Follow Up Consult  Pharmacy Consult for Bivalirudin Indication: IABP  Allergies  Allergen Reactions  . Penicillins Rash    Patient Measurements: Height: 5\' 9"  (175.3 cm) Weight: 194 lb 10.7 oz (88.3 kg) IBW/kg (Calculated) : 70.7 Heparin Dosing Weight: 88 kg  Vital Signs: Temp: 97.9 F (36.6 C) (05/08 1940) Temp Source: Core (Comment) (05/08 1600) BP: 118/78 mmHg (05/08 1900) Pulse Rate: 78 (05/08 1940)  Labs:  Recent Labs  05/17/14 1800 05/17/14 1913 05/18/14 0400 05/18/14 1623 05/19/14 0558 05/19/14 1120 05/19/14 1500 05/19/14 1827  HGB  --  11.9* 11.2*  --  11.7*  --   --   --   HCT  --  36.9* 35.4*  --  36.6*  --   --   --   PLT  --  129* 125*  --  100*  --   --   --   APTT  --   --   --   --   --  53* 64* 66*  HEPARINUNFRC 0.32  --  0.25*  --  0.27*  --   --   --   CREATININE  --  1.50* 1.50* 1.58* 1.38*  --   --   --     Estimated Creatinine Clearance: 54 mL/min (by C-G formula based on Cr of 1.38).   Medications:   Infusions:  . amiodarone 30 mg/hr (05/19/14 1900)  . bivalirudin (ANGIOMAX) infusion 0.5 mg/mL (Non-ACS indications) 0.15 mg/kg/hr (05/19/14 1900)  . DOBUTamine 5 mcg/kg/min (05/19/14 1900)  . lidocaine 1 mg/min (05/19/14 1900)  . norepinephrine (LEVOPHED) Adult infusion Stopped (05/18/14 0440)    Assessment: 74 yo male admitted with afib/NSTEMI, now s/p cath.  IV heparin started for IABP.    Patient suspected for possible HIT.  Admitting platelet at 271, now at 100 after 8 days, indicating a drop > 50% of platelets over 5-10 days. Heparin transitioned to bivalirudin. Of note, patient has an IABP placed, which may contribute to the decrease in platelets.  No other records of heparin exposure in EMR. Will order HIT panel per Dr. Aundra Dubin.  Patient is a high risk for bleeding, and therefore a more conservative dosing approach will be taken.   Initial aPTT was 64, follow up is also therapeutic at 66. No dose  adjustments needed. No bleeding complications noted.  Goal of Therapy:  aPTT 50-85 seconds Monitor platelets by anticoagulation protocol: Yes   Plan:  Continue Bival 0.15mg /kg/hr. Daily aptt with cbc  Erin Hearing PharmD., BCPS Clinical Pharmacist Pager [phone] 05/19/2014 8:31 PM

## 2014-05-19 NOTE — Progress Notes (Signed)
ANTICOAGULATION CONSULT NOTE - Follow Up Consult  Pharmacy Consult for Heparin Indication: IABP  Allergies  Allergen Reactions  . Penicillins Rash    Patient Measurements: Height: 5\' 9"  (175.3 cm) Weight: 194 lb 10.7 oz (88.3 kg) IBW/kg (Calculated) : 70.7 Heparin Dosing Weight: 88 kg  Vital Signs: Temp: 97.3 F (36.3 C) (05/08 1615) Temp Source: Core (Comment) (05/08 1600) BP: 148/82 mmHg (05/08 1600) Pulse Rate: 77 (05/08 1615)  Labs:  Recent Labs  05/17/14 1800 05/17/14 1913 05/18/14 0400 05/18/14 1623 05/19/14 0558 05/19/14 1120 05/19/14 1500  HGB  --  11.9* 11.2*  --  11.7*  --   --   HCT  --  36.9* 35.4*  --  36.6*  --   --   PLT  --  129* 125*  --  100*  --   --   APTT  --   --   --   --   --  53* 64*  HEPARINUNFRC 0.32  --  0.25*  --  0.27*  --   --   CREATININE  --  1.50* 1.50* 1.58* 1.38*  --   --     Estimated Creatinine Clearance: 54 mL/min (by C-G formula based on Cr of 1.38).   Medications:   Infusions:  . amiodarone 30 mg/hr (05/19/14 1051)  . bivalirudin (ANGIOMAX) infusion 0.5 mg/mL (Non-ACS indications) 0.15 mg/kg/hr (05/19/14 1050)  . DOBUTamine 5 mcg/kg/min (05/18/14 2212)  . lidocaine 1 mg/min (05/19/14 0321)  . norepinephrine (LEVOPHED) Adult infusion Stopped (05/18/14 0440)    Assessment: 74 yo male admitted with afib/NSTEMI, now s/p cath.  IV heparin started for IABP.    Patient suspected for possible HIT.  Admitting platelet at 271, now at 100 after 8 days, indicating a drop > 50% of platelets over 5-10 days. Heparin transitioned to bivalirudin. Of note, patient has an IABP placed, which may contribute to the decrease in platelets.  No other records of heparin exposure in EMR. Will order HIT panel per Dr. Aundra Dubin.  Patient is a high risk for bleeding, and therefore a more conservative dosing approach will be taken.  First aPTT 64  Goal of Therapy:  Heparin level 0.2-0.5 while on IABP Monitor platelets by anticoagulation protocol:  Yes   Plan:  Bival 0.15mg /kg/hr. Recheck in 4 hrs.   Kaprice Kage S. Alford Highland, PharmD, [facility] Clinical Staff Pharmacist Pager [phone]   05/19/2014 4:28 PM

## 2014-05-19 NOTE — Progress Notes (Addendum)
Patient ID: Jeffrey [name], male   DOB: October 21, 1940, 74 y.o.   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 IABP placed, on started on milrinone.  He than had VF arrest requiring DC-CV. Started on amio with persistent ectopy. Lidocaine started which worked well. Off milrinone. Dobutamine begun 5/6.    No arrhythmias overnight. Weight down 5 lbs, diuresed well. Awake/alert, denies dyspnea/chest pain.    Luiz Blare numbers done personally at bedside RA 6 PA 44/24  PCWP 22 Thermo CI 2.1 Co-ox 61% => 64%=>79%   Objective:   Weight Range:  Vital Signs:   Temp:  [96.8 F (36 C)-98.6 F (37 C)] 97.5 F (36.4 C) (05/08 0600) Pulse Rate:  [49-121] 69 (05/08 0600) Resp:  [14-23] 14 (05/08 0600) BP: (95-140)/(47-101) 96/59 mmHg (05/08 0600) SpO2:  [95 %-100 %] 99 % (05/08 0600) Weight:  [194 lb 10.7 oz (88.3 kg)] 194 lb 10.7 oz (88.3 kg) (05/08 0400) Last BM Date: 05/12/14  Weight change: Filed Weights   05/17/14 0410 05/18/14 0405 05/19/14 0400  Weight: 200 lb 2.8 oz (90.8 kg) 199 lb 4.7 oz (90.4 kg) 194 lb 10.7 oz (88.3 kg)    Intake/Output:   Intake/Output Summary (Last 24 hours) at 05/19/14 3300 Last data filed at 05/19/14 0600  Gross per 24 hour  Intake 2182.5 ml  Output   6600 ml  Net -4417.5 ml     Physical Exam: General:  elderly appearing. Lying flat in bed No resp difficulty. Hard of hearing HEENT: normal Neck: supple. RIJ swan  Carotids 2+ bilat; no bruits. No lymphadenopathy or thryomegaly appreciated. Cor: PMI laterally displaced. Irregular rate & rhythm. +s3 2/6 MR.  Pedal pulses palpable.  Lungs: clear Abdomen: soft, nontender, nondistended. No hepatosplenomegaly. No bruits or masses. Good bowel sounds. Extremities: no cyanosis, clubbing, rash, tr edema Left groin IABP Righ groin candidiasis Neuro: alert &  orientedx3, HOH moves all 4 extremities w/o difficulty. Affect pleasant  Telemetry: AF 60s   Labs: Basic Metabolic Panel:  Recent Labs Lab 05/31/2014 0837 06/04/2014 2020  05/17/14 0440 05/17/14 1913 05/18/14 0400 05/18/14 1623 05/19/14 0558  NA 138 139  < > 137 132* 130* 130* 134*  K 4.1 3.3*  < > 4.1 4.3 3.8 4.3 3.9  CL 108 106  < > 103 101 100* 99* 99*  CO2 20* 23  < > 23 22 22 22 26   GLUCOSE 280* 233*  < > 249* 275* 145* 185* 80  BUN 40* 32*  < > 32* 34* 33* 29* 25*  CREATININE 1.40* 1.29*  < > 1.34* 1.50* 1.50* 1.58* 1.38*  CALCIUM 8.8* 9.0  < > 8.7* 8.2* 8.1* 8.0* 8.4*  MG 1.6* 3.8*  --  2.0  --  1.7  --  1.7  PHOS  --  3.7  --   --   --   --   --   --   < > = values in this interval not displayed.  Liver Function Tests:  Recent Labs Lab 05/15/14 0342 05/29/2014 0837 05/17/14 0440  AST 16 20 20   ALT 23 27 26   ALKPHOS 88 97 91  BILITOT 0.9 0.7 1.0  PROT 6.3* 6.5 5.8*  ALBUMIN 3.4* 3.5 3.2*   No results for input(s): LIPASE, AMYLASE in the last 168 hours. No results for input(s): AMMONIA in the last 168 hours.  CBC:  Recent Labs Lab 06/09/2014 1630  06/04/2014 2020 05/17/14 0440 05/17/14 1913 05/18/14 0400 05/19/14 0558  WBC 9.1  < > 4.7 11.4* 9.7 10.5 8.9  NEUTROABS 7.1  --   --   --   --   --   --   HGB 13.0  < > 13.6 12.1* 11.9* 11.2* 11.7*  HCT 42.0  < > 42.3 38.8* 36.9* 35.4* 36.6*  MCV 78.1  < > 75.9* 76.2* 75.6* 75.2* 74.2*  PLT 271  < > 188 179 129* 125* PENDING  < > = values in this interval not displayed.  Cardiac Enzymes:  Recent Labs Lab 06/07/2014 1630 05/23/2014 1730 05/22/2014 2114 05/15/14 0342 05/15/14 0900  TROPONINI 0.40* 0.39* 0.45* 0.37* 0.35*    BNP: BNP (last 3 results)  Recent Labs  05/21/2014 1630  BNP 885.0*    ProBNP (last 3 results) No results for input(s): PROBNP in the last 8760 hours.    Other results:  Imaging: No results found.   Medications:     Scheduled Medications: . aspirin EC  81 mg Oral Daily   . atorvastatin  80 mg Oral QHS  . furosemide  40 mg Oral Daily  . glipiZIDE  5 mg Oral BID AC  . hydrALAZINE  12.5 mg Oral TID  . insulin aspart  0-20 Units Subcutaneous TID WC  . insulin aspart  0-5 Units Subcutaneous QHS  . insulin glargine  10 Units Subcutaneous QHS  . levothyroxine  50 mcg Oral QAC breakfast  . magnesium sulfate 1 - 4 g bolus IVPB  2 g Intravenous Once  . multivitamin with minerals  1 tablet Oral Daily  . potassium chloride  40 mEq Oral BID  . sodium chloride  3 mL Intravenous Q12H  . sodium chloride  3 mL Intravenous Q12H    Infusions: . amiodarone 30 mg/hr (05/19/14 0012)  . DOBUTamine 5 mcg/kg/min (05/18/14 2212)  . heparin 1,200 Units/hr (05/18/14 1900)  . lidocaine 1 mg/min (05/19/14 0321)  . norepinephrine (LEVOPHED) Adult infusion Stopped (05/18/14 0440)    PRN Medications: sodium chloride, sodium chloride, acetaminophen, morphine injection, ondansetron (ZOFRAN) IV, sodium chloride, zolpidem   Assessment:   1. Cardiogenic shock 2. Ventricular fibrillation arrest 5/6 in setting of milrinone use 3. 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 3v CAD: Occluded RCA, subtotally occluded prox LAD, 90% LCx 5. DM2 6. Atrial fibrillation   Plan/Discussion:    Very good diuresis yesterday, filling pressure much better.  Excellent co-ox, CI by thermo also improved. Unable to tolerate milrinone due to VF.  Tolerating dobutamine at 5.   - IABP to remain in, 1:1.  Hemoglobin ok.  Heparin gtt on.  - Continue dobutamine @ 5 - No further arrhythmias: yesterday decreased amiodarone to 30 mg/hr and lidocaine to 1 mg/min to limit negative inotropy.  - Will make Lasix po  - With improve hemodynamics, will need evaluation in am for high risk CABG.  LVAD would be option, hemodynamics suggest improved RV function.  Will get limited echo to reassess. - CXR for IABP position.    Continue heparin for IABP/atrial fibrillation.  Platelets trending down, await level this morning.   HR controlled in afib this morning.   Continue SSI.   The patient is critically ill with multiple organ systems failure and requires high complexity decision making for assessment and support, frequent evaluation and titration of therapies, application of advanced  monitoring technologies and extensive interpretation of multiple databases.   Critical Care Time devoted to patient care services described in this note is 35 Minutes.  Length of Stay: 5  Loralie Champagne MD 05/19/2014, 7:12 AM  Advanced Heart Failure Team Pager [phone] (M-F; [place])  Please contact [facility] for night-coverage after hours (4p -7a ) and weekends on [email or web address]  Plts down to 100 K, started at 271 K.  May be consumption with IABP but HCT stable.  Will send HIT panel, transition heparin gtt to bivalirudin.   Loralie Champagne 05/19/2014 8:24 AM

## 2014-05-19 NOTE — Progress Notes (Signed)
At 0730 CBG 68. OJ given and recheck 102.

## 2014-05-20 ENCOUNTER — Other Ambulatory Visit: Payer: Self-pay | Admitting: *Deleted

## 2014-05-20 ENCOUNTER — Inpatient Hospital Stay (HOSPITAL_COMMUNITY): Payer: Commercial Managed Care - HMO

## 2014-05-20 ENCOUNTER — Other Ambulatory Visit (HOSPITAL_COMMUNITY): Payer: Self-pay | Admitting: Respiratory Therapy

## 2014-05-20 DIAGNOSIS — I2510 Atherosclerotic heart disease of native coronary artery without angina pectoris: Secondary | ICD-10-CM

## 2014-05-20 DIAGNOSIS — I25110 Atherosclerotic heart disease of native coronary artery with unstable angina pectoris: Secondary | ICD-10-CM

## 2014-05-20 DIAGNOSIS — I2589 Other forms of chronic ischemic heart disease: Secondary | ICD-10-CM

## 2014-05-20 LAB — GLUCOSE, CAPILLARY
Glucose-Capillary: 89 mg/dL (ref 70–99)
Glucose-Capillary: 159 mg/dL — ABNORMAL HIGH (ref 70–99)
Glucose-Capillary: 132 mg/dL — ABNORMAL HIGH (ref 70–99)

## 2014-05-20 LAB — BASIC METABOLIC PANEL
ANION GAP: 7 (ref 5–15)
Anion gap: 12 (ref 5–15)
BUN: 21 mg/dL — ABNORMAL HIGH (ref 6–20)
BUN: 19 mg/dL (ref 6–20)
CO2: 26 mmol/L (ref 22–32)
CO2: 25 mmol/L (ref 22–32)
CREATININE: 1.19 mg/dL (ref 0.61–1.24)
Calcium: 8.5 mg/dL — ABNORMAL LOW (ref 8.9–10.3)
Calcium: 8 mg/dL — ABNORMAL LOW (ref 8.9–10.3)
Chloride: 98 mmol/L — ABNORMAL LOW (ref 101–111)
Chloride: 91 mmol/L — ABNORMAL LOW (ref 101–111)
Creatinine, Ser: 1.32 mg/dL — ABNORMAL HIGH (ref 0.61–1.24)
GFR calc Af Amer: >60 mL/min (ref >60)
GFR calc non Af Amer: 60 mL/min — ABNORMAL LOW (ref >60)
GFR calc non Af Amer: 53 mL/min — ABNORMAL LOW (ref >60)
Glucose, Bld: 96 mg/dL (ref 70–99)
Glucose, Bld: 313 mg/dL — ABNORMAL HIGH (ref 70–99)
POTASSIUM: 4.4 mmol/L (ref 3.5–5.1)
Potassium: 4.5 mmol/L (ref 3.5–5.1)
Sodium: 131 mmol/L — ABNORMAL LOW (ref 135–145)
Sodium: 128 mmol/L — ABNORMAL LOW (ref 135–145)

## 2014-05-20 LAB — CBC
HEMATOCRIT: 38.2 % — AB (ref 39.0–52.0)
Hemoglobin: 12.1 g/dL — ABNORMAL LOW (ref 13.0–17.0)
MCH: 23.6 pg — ABNORMAL LOW (ref 26.0–34.0)
MCHC: 31.7 g/dL (ref 30.0–36.0)
MCV: 74.6 fL — ABNORMAL LOW (ref 78.0–100.0)
Platelets: 79 10*3/uL — ABNORMAL LOW (ref 150–400)
RBC: 5.12 MIL/uL (ref 4.22–5.81)
RDW: 16.9 % — AB (ref 11.5–15.5)
WBC: 9 10*3/uL (ref 4.0–10.5)

## 2014-05-20 LAB — HEMOGLOBIN A1C
Hgb A1c MFr Bld: 7.4 % — ABNORMAL HIGH (ref 4.8–5.6)
Mean Plasma Glucose: 166 mg/dL

## 2014-05-20 LAB — CARBOXYHEMOGLOBIN
CARBOXYHEMOGLOBIN: 1.3 % (ref 0.5–1.5)
Carboxyhemoglobin: 1.1 % (ref 0.5–1.5)
Methemoglobin: 1.3 % (ref 0.0–1.5)
Methemoglobin: 1 % (ref 0.0–1.5)
O2 SAT: 80.1 %
O2 Saturation: 77.4 %
TOTAL HEMOGLOBIN: 12.4 g/dL — AB (ref 13.5–18.0)
Total hemoglobin: 12.3 g/dL — ABNORMAL LOW (ref 13.5–18.0)

## 2014-05-20 LAB — HEPARIN INDUCED THROMBOCYTOPENIA PNL: Heparin Induced Plt Ab: 0.141 OD (ref 0.000–0.400)

## 2014-05-20 LAB — MAGNESIUM: MAGNESIUM: 1.6 mg/dL — AB (ref 1.7–2.4)

## 2014-05-20 LAB — APTT: APTT: 66 s — AB (ref 24–37)

## 2014-05-20 MED ORDER — AMIODARONE HCL IN DEXTROSE 360-4.14 MG/200ML-% IV SOLN
60.0000 mg/h | INTRAVENOUS | Status: AC
  Administered 2014-05-20 – 2014-05-21 (×2): 60 mg/h via INTRAVENOUS
  Filled 2014-05-20: qty 200

## 2014-05-20 MED ORDER — SODIUM CHLORIDE 0.9 % IV SOLN
0.1500 mg/kg/h | INTRAVENOUS | Status: DC
  Filled 2014-05-20: qty 250

## 2014-05-20 MED ORDER — SODIUM CHLORIDE 0.9 % IJ SOLN: 10.0000 mL | INTRAMUSCULAR | Status: DC | PRN

## 2014-05-20 MED ORDER — AMIODARONE LOAD VIA INFUSION
150.0000 mg | Freq: Once | INTRAVENOUS | Status: AC
  Administered 2014-05-20: 150 mg via INTRAVENOUS
  Filled 2014-05-20: qty 83.34

## 2014-05-20 MED ORDER — DEXTROSE 5 % IV SOLN
1.0000 g | Freq: Three times a day (TID) | INTRAVENOUS | Status: DC
  Administered 2014-05-20 – 2014-05-23 (×9): 1 g via INTRAVENOUS
  Filled 2014-05-20 (×10): qty 1

## 2014-05-20 MED ORDER — CETYLPYRIDINIUM CHLORIDE 0.05 % MT LIQD: 7.0000 mL | Freq: Two times a day (BID) | OROMUCOSAL | Status: DC

## 2014-05-20 MED ORDER — AMIODARONE HCL IN DEXTROSE 360-4.14 MG/200ML-% IV SOLN
30.0000 mg/h | INTRAVENOUS | Status: DC
  Administered 2014-05-21 – 2014-05-23 (×5): 30 mg/h via INTRAVENOUS
  Filled 2014-05-20 (×14): qty 200
  Filled 2014-05-20: qty 400
  Filled 2014-05-20 (×6): qty 200
  Filled 2014-05-20: qty 400
  Filled 2014-05-20 (×15): qty 200

## 2014-05-20 MED ORDER — AMIODARONE HCL 200 MG PO TABS
200.0000 mg | ORAL_TABLET | Freq: Two times a day (BID) | ORAL | Status: DC
  Administered 2014-05-20: 200 mg via ORAL
  Filled 2014-05-20 (×2): qty 1

## 2014-05-20 MED ORDER — AMIODARONE HCL IN DEXTROSE 360-4.14 MG/200ML-% IV SOLN
INTRAVENOUS | Status: AC
  Filled 2014-05-20: qty 200

## 2014-05-20 MED ORDER — SODIUM CHLORIDE 0.9 % IJ SOLN
10.0000 mL | Freq: Two times a day (BID) | INTRAMUSCULAR | Status: DC
  Administered 2014-05-21 (×2): 10 mL
  Administered 2014-05-22: 30 mL
  Administered 2014-05-22: 10 mL

## 2014-05-20 MED ORDER — MAGNESIUM SULFATE 4 GM/100ML IV SOLN
4.0000 g | Freq: Once | INTRAVENOUS | Status: AC
  Administered 2014-05-21: 4 g via INTRAVENOUS
  Filled 2014-05-20: qty 100

## 2014-05-20 MED ORDER — HEPARIN (PORCINE) IN NACL 100-0.45 UNIT/ML-% IJ SOLN
1200.0000 [IU]/h | INTRAMUSCULAR | Status: DC
  Administered 2014-05-20: 1200 [IU]/h via INTRAVENOUS
  Filled 2014-05-20: qty 250

## 2014-05-20 NOTE — Progress Notes (Addendum)
Gordy Councilman catheter discontinued and cortus left in place per MD order.

## 2014-05-20 NOTE — Progress Notes (Signed)
Advanced Heart Failure Rounding Note   Subjective:    21M with HTN, DM, HLD admitted with NSTEMI and new AF, RBBB, EF 25% with diffuse HK, also with WCT on telemetry. Txfer to [facility]. 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 IABP placed, started on milrinone. He than had VF arrest requiring DC-CV. Started on amio with persistent ectopy. Lidocaine started which worked well. Off milrinone. Dobutamine begun 5/6. Revascularization to be considered once more stable.  Feels "pretty good" this AM. No acute complaints. Plateletes remain low. Now on bival.  Remains on dobutamine 5, amio 30, lido 1  Swan numbers done personally at bedside RA 10 PA 47/23 PCWP 18 Thermo 4.3/2.1  Echo reviewed personally LVEF 20-25% RV moderate to severely HK  Objective:    Vital Signs:   Temp:  [97.2 F (36.2 C)-98.4 F (36.9 C)] 97.7 F (36.5 C) (05/09 1000) Pulse Rate:  [67-176] 166 (05/09 1000) Resp:  [14-24] 18 (05/09 1000) BP: (103-157)/(53-103) 134/81 mmHg (05/09 1000) SpO2:  [95 %-100 %] 98 % (05/09 1000) Weight:  [197 lb 12 oz (89.7 kg)] 197 lb 12 oz (89.7 kg) (05/09 0300) Last BM Date: 05/19/14  Weight change: Filed Weights   05/18/14 0405 05/19/14 0400 05/20/14 0300  Weight: 199 lb 4.7 oz (90.4 kg) 194 lb 10.7 oz (88.3 kg) 197 lb 12 oz (89.7 kg)    Intake/Output:   Intake/Output Summary (Last 24 hours) at 05/20/14 1034 Last data filed at 05/20/14 1000  Gross per 24 hour  Intake 2623.22 ml  Output   5950 ml  Net -3326.78 ml     Physical Exam: General:  Well appearing. No resp difficulty HEENT: normal Neck: supple. JVP flat. No lymphadenopathy or thryomegaly appreciated. Cor: PMI nondisplaced. Irregular rhythm, controlled rate. No rubs, gallops or murmurs. Lungs: clear Abdomen: soft, nontender, nondistended. No hepatosplenomegaly. No bruits or masses. Good bowel sounds. Extremities: no cyanosis,  clubbing, rash, edema Neuro: alert & orientedx3, cranial nerves grossly intact. moves all 4 extremities w/o difficulty. Affect pleasant  Telemetry: afib rate controlled  Labs: Basic Metabolic Panel:  Recent Labs Lab 05/15/2014 0837 05/23/2014 2020  05/17/14 0440 05/17/14 1913 05/18/14 0400 05/18/14 1623 05/19/14 0558 05/20/14 0425  NA 138 139  < > 137 132* 130* 130* 134* 131*  K 4.1 3.3*  < > 4.1 4.3 3.8 4.3 3.9 4.5  CL 108 106  < > 103 101 100* 99* 99* 98*  CO2 20* 23  < > 23 22 22 22 26 26   GLUCOSE 280* 233*  < > 249* 275* 145* 185* 80 96  BUN 40* 32*  < > 32* 34* 33* 29* 25* 21*  CREATININE 1.40* 1.29*  < > 1.34* 1.50* 1.50* 1.58* 1.38* 1.19  CALCIUM 8.8* 9.0  < > 8.7* 8.2* 8.1* 8.0* 8.4* 8.5*  MG 1.6* 3.8*  --  2.0  --  1.7  --  1.7  --   PHOS  --  3.7  --   --   --   --   --   --   --   < > = values in this interval not displayed.  Liver Function Tests:  Recent Labs Lab 05/15/14 0342 05/18/2014 0837 05/17/14 0440  AST 16 20 20   ALT 23 27 26   ALKPHOS 88 97 91  BILITOT 0.9 0.7 1.0  PROT 6.3* 6.5 5.8*  ALBUMIN 3.4* 3.5 3.2*   CBC:  Recent Labs Lab  06/02/2014 1630  05/17/14 0440 05/17/14 1913 05/18/14 0400 05/19/14 0558 05/20/14 0425  WBC 9.1  < > 11.4* 9.7 10.5 8.9 9.0  NEUTROABS 7.1  --   --   --   --   --   --   HGB 13.0  < > 12.1* 11.9* 11.2* 11.7* 12.1*  HCT 42.0  < > 38.8* 36.9* 35.4* 36.6* 38.2*  MCV 78.1  < > 76.2* 75.6* 75.2* 74.2* 74.6*  PLT 271  < > 179 129* 125* 100* 79*  < > = values in this interval not displayed.  Cardiac Enzymes:  Recent Labs Lab 05/20/2014 1630 05/15/2014 1730 05/18/2014 2114 05/15/14 0342 05/15/14 0900  TROPONINI 0.40* 0.39* 0.45* 0.37* 0.35*    BNP: BNP (last 3 results)  Recent Labs  05/24/2014 1630  BNP 885.0*    ProBNP (last 3 results) No results for input(s): PROBNP in the last 8760 hours.    Other results:  Imaging: Dg Chest Port 1 View  05/19/2014   CLINICAL DATA:  Followup CHF.  EXAM: PORTABLE CHEST  - 1 VIEW  COMPARISON:  05/16/2014.  FINDINGS: Enlarged cardiac silhouette with improvement. Mild decrease in prominence of the pulmonary vasculature and interstitial markings. No definite pleural fluid. Right jugular Swan-Ganz catheter tip in a right lower lobe pulmonary artery. Thoracic spine degenerative changes. Bilateral shoulder degenerative changes with superior migration of the humeral heads, compatible with chronic rotator cuff tears.  IMPRESSION: Improving cardiomegaly and changes of congestive heart failure.   Electronically Signed   By: Claudie Revering M.D.   On: 05/19/2014 10:18      Medications:     Scheduled Medications: . antiseptic oral rinse  7 mL Mouth Rinse BID  . aspirin EC  81 mg Oral Daily  . atorvastatin  80 mg Oral QHS  . furosemide  40 mg Oral Daily  . glipiZIDE  5 mg Oral BID AC  . hydrALAZINE  12.5 mg Oral TID  . insulin aspart  0-20 Units Subcutaneous TID WC  . insulin aspart  0-5 Units Subcutaneous QHS  . insulin glargine  10 Units Subcutaneous QHS  . levothyroxine  50 mcg Oral QAC breakfast  . multivitamin with minerals  1 tablet Oral Daily  . potassium chloride  40 mEq Oral BID  . sodium chloride  3 mL Intravenous Q12H  . sodium chloride  3 mL Intravenous Q12H     Infusions: . amiodarone 30 mg/hr (05/20/14 1000)  . bivalirudin (ANGIOMAX) infusion 0.5 mg/mL (Non-ACS indications) 0.15 mg/kg/hr (05/20/14 0800)  . DOBUTamine 5 mcg/kg/min (05/20/14 0800)  . lidocaine 1 mg/min (05/20/14 0800)  . norepinephrine (LEVOPHED) Adult infusion Stopped (05/18/14 0440)     PRN Medications:  sodium chloride, sodium chloride, acetaminophen, morphine injection, ondansetron (ZOFRAN) IV, sodium chloride, zolpidem   Assessment:   1. Cardiogenic shock 2. Ventricular fibrillation arrest 5/6 in setting of milrinone use 3. 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 3v CAD/NSTEMI:  Occluded RCA, subtotally occluded prox LAD, 90% LCx 5. DM2 6. Atrial fibrillation, newly diagnosed on admission 7. Microcytic anemia/thrombocytopenia  8. Acute kidney injury, improving 9. Urethral strictures s/p dilation this admission  Plan/Discussion:    Significant UOP yesterday (5.5L) with net neg -4.2L. F/u echo yesterday without significant change - EF 20-25%, +WMA, grade 3 DD, mild MR, mod TR, mildly increased PASP, normal RV function per report but per review by Dr. Haroldine Laws remains at least moderately  HK. Will discuss plan for diuretic with MD.  On amio @ 30, lidocaine @1 , dobutamine @ 5. Blood pressure stable. HR stable. Tele without significant ectopy or extraneous arrhythmia. CXR yesterday improving. CVP 5. TCTS has seen the patient, tentatively earliest OR is Wednesday. LVAD would be option, as hemodynamics suggest improved RV function.  Hgb stable but platelets continue to fall. HIT panel pending. ? R/t to IABP. Heparin transitioned to bivalrudin yesterday. Follow CBC. Will discuss plan for IABP with MD.  Length of Stay: 6  Dayna Dunn PA-C 05/20/2014, 10:34 AM  Advanced Heart Failure Team Pager [phone] (M-F; [place])  Please contact [facility] for night-coverage after hours (4p -7a ) and weekends on [email or web address]  Patient seen and examined with Melina Copa, PA-C. We discussed all aspects of the encounter. I agree with the assessment and plan as stated above.   Now much improved with dobutamine and IABP support. I reviewed echo personally and still has significant biventricular dysfunction however RA/PCWP ratio now favorable. He has been seen by Dr. Prescott Gum and possible high-risk CABG on Wednesday. I discussed potential need for mechanical support as a possible temporary bail out strategy.  Will remove Swan today. Will place PICC for dobutamine if OK with Dr. Prescott Gum. Will turn IABP down to 1:2 to see if this helps spare platelets. Continue bival. HIT panel pending. Chang  amio to po.   Need to keep Foley in for now due to need for urtehral dilations. Will start empiric abx coverage.   The patient is critically ill with multiple organ systems failure and requires high complexity decision making for assessment and support, frequent evaluation and titration of therapies, application of advanced monitoring technologies and extensive interpretation of multiple databases.   Critical Care Time devoted to patient care services described in this note is 45 Minutes.  Samari Bittinger,MD 11:50 AM

## 2014-05-20 NOTE — Progress Notes (Signed)
Peripherally Inserted Central Catheter/Midline Placement  The IV Nurse has discussed with the patient and/or persons authorized to consent for the patient, the purpose of this procedure and the potential benefits and risks involved with this procedure.  The benefits include less needle sticks, lab draws from the catheter and patient may be discharged home with the catheter.  Risks include, but not limited to, infection, bleeding, blood clot (thrombus formation), and puncture of an artery; nerve damage and irregular heat beat.  Alternatives to this procedure were also discussed.  Consent obtained by Claretha Cooper, RN.  PICC/Midline Placement Documentation        Jeffrey [name], Jeffrey [name] 05/20/2014, 6:43 PM

## 2014-05-20 NOTE — Progress Notes (Signed)
ANTICOAGULATION CONSULT NOTE - Initial Consult  Pharmacy Consult for Heparin Indication: IABP  Allergies  Allergen Reactions  . Penicillins Rash    Patient Measurements: Height: 5\' 9"  (175.3 cm) Weight: 197 lb 12 oz (89.7 kg) IBW/kg (Calculated) : 70.7 Heparin Dosing Weight:   Vital Signs: Temp: 98.8 F (37.1 C) (05/09 1600) Temp Source: Oral (05/09 1600) BP: 131/88 mmHg (05/09 1800) Pulse Rate: 45 (05/09 1800)  Labs:  Recent Labs  05/18/14 0400 05/18/14 1623 05/19/14 0558  05/19/14 1500 05/19/14 1827 05/20/14 0425  HGB 11.2*  --  11.7*  --   --   --  12.1*  HCT 35.4*  --  36.6*  --   --   --  38.2*  PLT 125*  --  100*  --   --   --  79*  APTT  --   --   --   < > 64* 66* 66*  HEPARINUNFRC 0.25*  --  0.27*  --   --   --   --   CREATININE 1.50* 1.58* 1.38*  --   --   --  1.19  < > = values in this interval not displayed.  Estimated Creatinine Clearance: 63.1 mL/min (by C-G formula based on Cr of 1.19).   Medical History: Past Medical History  Diagnosis Date  . Type 2 diabetes mellitus   . Hypothyroidism   . Hyperlipidemia   . Essential hypertension   . Insomnia   . Depression   . Atrial fibrillation     Diagnosed May 2016    Medications:  Scheduled:  . amiodarone  200 mg Oral BID  . antiseptic oral rinse  7 mL Mouth Rinse BID  . aspirin EC  81 mg Oral Daily  . atorvastatin  80 mg Oral QHS  . ceFEPime (MAXIPIME) IV  1 g Intravenous 3 times per day  . furosemide  40 mg Oral Daily  . glipiZIDE  5 mg Oral BID AC  . hydrALAZINE  12.5 mg Oral TID  . insulin aspart  0-20 Units Subcutaneous TID WC  . insulin aspart  0-5 Units Subcutaneous QHS  . insulin glargine  10 Units Subcutaneous QHS  . levothyroxine  50 mcg Oral QAC breakfast  . multivitamin with minerals  1 tablet Oral Daily  . potassium chloride  40 mEq Oral BID  . sodium chloride  3 mL Intravenous Q12H    Assessment: 74yo male on Bivalirudin with need for high risk CABG, to change to Heparin  due to risk of severe bleeding and impact on survival with Bivalirudin.  HIT (-), SRA (p).  The IABP, in place since 5/5, is likely contributing to the decreased pltc.  I have discussed this with Dr. Haroldine Laws who feels the pt does not have HIT.  Goal of Therapy:  Heparin level 0.3-0.5 units/ml Monitor platelets by anticoagulation protocol: Yes   Plan:  D/C Bivalirudin Heparin 1200 units/hr, no bolus- to start 84min after Bivalirudin stopped Check Heparin level 8hr Daily HL, CBC  Gracy Bruins, [facility]

## 2014-05-20 NOTE — Plan of Care (Signed)
Problem: Phase I Progression Outcomes Goal: Voiding-avoid urinary catheter unless indicated Outcome: Not Met (add Reason) IABP  Receiving Lasix. Uro had to insert foley

## 2014-05-20 NOTE — Progress Notes (Addendum)
Called by nursing staff for ventricular ectopy and runs of NSVT, tele reviewed. Discussed patient with Dr Haroldine Laws who has been following. We will check electrolytes, restart amio drip, and change balloon pump back to 1:1. Continue dobutamine at current dose as he requires the hemodynamic support.    Zandra Abts MD

## 2014-05-20 NOTE — Progress Notes (Signed)
ANTICOAGULATION CONSULT NOTE - Follow Up Consult ANTIBIOTIC CONSULT NOTE - Initial Consult  Pharmacy Consult for Bivalirudin and Cefepime Indication: IABP and Empiric  Allergies  Allergen Reactions  . Penicillins Rash    Patient Measurements: Height: 5\' 9"  (175.3 cm) Weight: 197 lb 12 oz (89.7 kg) IBW/kg (Calculated) : 70.7  Vital Signs: Temp: 98.2 F (36.8 C) (05/09 1200) Temp Source: Core (Comment) (05/09 1200) BP: 125/55 mmHg (05/09 1200) Pulse Rate: 78 (05/09 1200)  Labs:  Recent Labs  05/17/14 1800  05/18/14 0400 05/18/14 1623 05/19/14 0558  05/19/14 1500 05/19/14 1827 05/20/14 0425  HGB  --   < > 11.2*  --  11.7*  --   --   --  12.1*  HCT  --   < > 35.4*  --  36.6*  --   --   --  38.2*  PLT  --   < > 125*  --  100*  --   --   --  79*  APTT  --   --   --   --   --   < > 64* 66* 66*  HEPARINUNFRC 0.32  --  0.25*  --  0.27*  --   --   --   --   CREATININE  --   < > 1.50* 1.58* 1.38*  --   --   --  1.19  < > = values in this interval not displayed.  Estimated Creatinine Clearance: 63.1 mL/min (by C-G formula based on Cr of 1.19).   Medications:  Bivalirudin @ 0.15mg /kg/hr  Assessment: 73yom admitted with afib/NSTEMI found to have severe 3v CAD by cath. IABP was placed and heparin started. Platelets then began to decrease (>50% drop over 5-10 days) and HIT was suspected (HIT panel in process). Heparin transitioned to bivalirudin. He continues on bivalirudin with a therapeutic aPTT. Platelets continue to decrease to 79 (IABP could be contributing so changed to 1:2). He is awaiting tentative CABG on Wednesday.  He will also begin empiric cefepime to cover his urine and lines prior to surgery. Renal function is stable.  Goal of Therapy:  APTT 50-85 seconds Monitor platelets by anticoagulation protocol: Yes   Plan:  1) Continue bivalirudin at 0.15mg /kg/hr 2) Follow up daily aPTT 3) Follow up HIT panel 4) Cefepime 1g IV q8  Deboraha Sprang 05/20/2014,12:53 PM

## 2014-05-20 NOTE — Progress Notes (Signed)
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   He is a potential candidate for high risk CABG with [place] 5.0 postop support if heparin can be used for surgery- HIT panel with serotonin uptake sent this am- will need results prior to CABG. Using bivalirudin for cardiopulmonary bypass results in severe bleeding which has a sig impact on survival .   .Objective: Vital signs in last 24 hours: Temp:  [97.3 F (36.3 C)-98.6 F (37 C)] 98.6 F (37 C) (05/09 1400) Pulse Rate:  [38-176] 40 (05/09 1500) Cardiac Rhythm:  [-] Atrial fibrillation (05/09 0800) Resp:  [14-24] 24 (05/09 1500) BP: (103-157)/(33-103) 129/85 mmHg (05/09 1500) SpO2:  [95 %-100 %] 98 % (05/09 1500) Weight:  [197 lb 12 oz (89.7 kg)] 197 lb 12 oz (89.7 kg) (05/09 0300)  Hemodynamic parameters for last 24 hours: PAP: (42-58)/(15-29) 53/26 mmHg CVP:  [5 mmHg-13 mmHg] 5 mmHg PCWP:  [18 mmHg-24 mmHg] 18 mmHg CO:  [4.3 L/min-4.6 L/min] 4.3 L/min CI:  [2.1 L/min/m2-2.2 L/min/m2] 2.1 L/min/m2  Intake/Output from previous day: 05/08 0701 - 05/09 0700 In: 2820 [P.O.:1180; I.V.:1640] Out: 5500 [Urine:5500] Intake/Output this shift: Total I/O In: 566.9 [I.V.:516.9; IV Piggyback:50] Out: 2175 [Urine:2175]  Neuro intact Pedal pulses in tact Cardiac output 4.4 Lab Results:  Recent Labs  05/19/14 0558 05/20/14 0425  WBC 8.9 9.0  HGB 11.7* 12.1*  HCT 36.6* 38.2*  PLT 100* 79*   BMET:  Recent Labs  05/19/14 0558 05/20/14 0425  NA 134* 131*  K 3.9 4.5  CL 99* 98*  CO2 26 26  GLUCOSE 80 96  BUN 25* 21*  CREATININE 1.38* 1.19  CALCIUM 8.4*  8.5*    PT/INR: No results for input(s): LABPROT, INR in the last 72 hours. ABG    Component Value Date/Time   PHART 7.293* 06/04/2014 1456   HCO3 18.8* 06/03/2014 1456   TCO2 20 05/17/2014 1456   ACIDBASEDEF 7.0* 05/27/2014 1456   O2SAT 77.4 05/20/2014 1325   CBG (last 3)   Recent Labs  05/19/14 1710 05/19/14 2129 05/20/14 1158  GLUCAP 147* 135* 159*    Assessment/Plan: S/P Procedure(s) (LRB): Right/Left Heart Cath and Coronary Angiography (N/A) IABP Insertion (N/A) Right Heart Cath Additional (N/A) Plan high risk CABG later this week when SRU results back Plan d/w patient  LOS: 6 days    Tharon Aquas Trigt III 05/20/2014

## 2014-05-21 ENCOUNTER — Encounter (HOSPITAL_COMMUNITY): Payer: Commercial Managed Care - HMO

## 2014-05-21 ENCOUNTER — Inpatient Hospital Stay (HOSPITAL_COMMUNITY): Payer: Commercial Managed Care - HMO

## 2014-05-21 DIAGNOSIS — I482 Chronic atrial fibrillation: Secondary | ICD-10-CM

## 2014-05-21 DIAGNOSIS — I472 Ventricular tachycardia: Secondary | ICD-10-CM

## 2014-05-21 LAB — GLUCOSE, CAPILLARY
Glucose-Capillary: 184 mg/dL — ABNORMAL HIGH (ref 70–99)
Glucose-Capillary: 222 mg/dL — ABNORMAL HIGH (ref 70–99)
Glucose-Capillary: 165 mg/dL — ABNORMAL HIGH (ref 70–99)
Glucose-Capillary: 158 mg/dL — ABNORMAL HIGH (ref 70–99)
Glucose-Capillary: 129 mg/dL — ABNORMAL HIGH (ref 70–99)

## 2014-05-21 LAB — CBC
HCT: 35.2 % — ABNORMAL LOW (ref 39.0–52.0)
HEMOGLOBIN: 11.2 g/dL — AB (ref 13.0–17.0)
MCH: 23.8 pg — AB (ref 26.0–34.0)
MCHC: 31.8 g/dL (ref 30.0–36.0)
MCV: 74.7 fL — ABNORMAL LOW (ref 78.0–100.0)
Platelets: 71 10*3/uL — ABNORMAL LOW (ref 150–400)
RBC: 4.71 MIL/uL (ref 4.22–5.81)
RDW: 16.8 % — ABNORMAL HIGH (ref 11.5–15.5)
WBC: 9.8 10*3/uL (ref 4.0–10.5)

## 2014-05-21 LAB — HEPARIN LEVEL (UNFRACTIONATED)
HEPARIN UNFRACTIONATED: 0.44 [IU]/mL (ref 0.30–0.70)
HEPARIN UNFRACTIONATED: 0.53 [IU]/mL (ref 0.30–0.70)

## 2014-05-21 LAB — BASIC METABOLIC PANEL
Anion gap: 8 (ref 5–15)
BUN: 18 mg/dL (ref 6–20)
CALCIUM: 7.8 mg/dL — AB (ref 8.9–10.3)
CO2: 24 mmol/L (ref 22–32)
Chloride: 94 mmol/L — ABNORMAL LOW (ref 101–111)
Creatinine, Ser: 1.28 mg/dL — ABNORMAL HIGH (ref 0.61–1.24)
GFR calc Af Amer: >60 mL/min (ref >60)
GFR, EST NON AFRICAN AMERICAN: 55 mL/min — AB (ref >60)
Glucose, Bld: 366 mg/dL — ABNORMAL HIGH (ref 70–99)
POTASSIUM: 4.3 mmol/L (ref 3.5–5.1)
Sodium: 126 mmol/L — ABNORMAL LOW (ref 135–145)

## 2014-05-21 LAB — SURGICAL PCR SCREEN
MRSA, PCR: NEGATIVE
Staphylococcus aureus: NEGATIVE

## 2014-05-21 LAB — ABO/RH: ABO/RH(D): A POS

## 2014-05-21 LAB — CARBOXYHEMOGLOBIN
Carboxyhemoglobin: 0.8 % (ref 0.5–1.5)
Methemoglobin: 0.8 % (ref 0.0–1.5)
O2 Saturation: 74.2 %
TOTAL HEMOGLOBIN: 9.7 g/dL — AB (ref 13.5–18.0)

## 2014-05-21 LAB — PLATELET INHIBITION P2Y12: Platelet Function  P2Y12: 206 [PRU] (ref 194–418)

## 2014-05-21 MED ORDER — HALOPERIDOL LACTATE 5 MG/ML IJ SOLN
5.0000 mg | Freq: Four times a day (QID) | INTRAMUSCULAR | Status: DC | PRN
  Administered 2014-05-21: 5 mg via INTRAVENOUS

## 2014-05-21 MED ORDER — HALOPERIDOL LACTATE 5 MG/ML IJ SOLN
INTRAMUSCULAR | Status: AC
  Filled 2014-05-21: qty 1

## 2014-05-21 MED ORDER — HALOPERIDOL LACTATE 5 MG/ML IJ SOLN
INTRAMUSCULAR | Status: AC
Start: 2014-05-21 — End: 2014-05-21
  Filled 2014-05-21: qty 1

## 2014-05-21 MED ORDER — DEXMEDETOMIDINE HCL IN NACL 400 MCG/100ML IV SOLN
0.4000 ug/kg/h | INTRAVENOUS | Status: DC
  Administered 2014-05-21 (×2): 1 ug/kg/h via INTRAVENOUS
  Administered 2014-05-21 (×2): 1.2 ug/kg/h via INTRAVENOUS
  Filled 2014-05-21 (×2): qty 100
  Filled 2014-05-21: qty 50
  Filled 2014-05-21 (×3): qty 100
  Filled 2014-05-21: qty 50

## 2014-05-21 MED ORDER — MAGNESIUM SULFATE 2 GM/50ML IV SOLN
2.0000 g | Freq: Once | INTRAVENOUS | Status: AC
  Administered 2014-05-21: 2 g via INTRAVENOUS

## 2014-05-21 MED ORDER — HALOPERIDOL LACTATE 5 MG/ML IJ SOLN
5.0000 mg | Freq: Once | INTRAMUSCULAR | Status: AC
  Administered 2014-05-21: 5 mg via INTRAVENOUS

## 2014-05-21 MED ORDER — HEPARIN (PORCINE) IN NACL 100-0.45 UNIT/ML-% IJ SOLN
1300.0000 [IU]/h | INTRAMUSCULAR | Status: DC
  Administered 2014-05-21: 1350 [IU]/h via INTRAVENOUS
  Administered 2014-05-22 (×2): 1300 [IU]/h via INTRAVENOUS
  Filled 2014-05-21 (×9): qty 250

## 2014-05-21 MED ORDER — MAGNESIUM SULFATE 2 GM/50ML IV SOLN
INTRAVENOUS | Status: AC
  Filled 2014-05-21: qty 50

## 2014-05-21 MED ORDER — MAGNESIUM SULFATE 2 GM/50ML IV SOLN: 2.0000 g | Freq: Once | INTRAVENOUS | Status: DC

## 2014-05-21 MED ORDER — INSULIN GLARGINE 100 UNIT/ML ~~LOC~~ SOLN
20.0000 [IU] | Freq: Two times a day (BID) | SUBCUTANEOUS | Status: DC
  Administered 2014-05-21 – 2014-05-22 (×3): 20 [IU] via SUBCUTANEOUS
  Filled 2014-05-21 (×6): qty 0.2

## 2014-05-21 NOTE — Progress Notes (Signed)
ANTICOAGULATION CONSULT NOTE - follow up  Pharmacy Consult for Heparin Indication: IABP  Allergies  Allergen Reactions  . Ambien [Zolpidem Tartrate] Other (See Comments)    Agitated delirium secondary to Ambien administration on 05/21/14  . Penicillins Rash    Patient Measurements: Height: 5\' 9"  (175.3 cm) Weight: 193 lb 5.5 oz (87.7 kg) IBW/kg (Calculated) : 70.7 Heparin Dosing Weight: 88 kg   Vital Signs: Temp: 98.5 F (36.9 C) (05/10 1600) Temp Source: Oral (05/10 1600) BP: 154/87 mmHg (05/10 1800) Pulse Rate: 76 (05/10 1800)  Labs:  Recent Labs  05/19/14 0558  05/19/14 1500 05/19/14 1827 05/20/14 0425 05/20/14 2156 05/21/14 0345 05/21/14 1030 05/21/14 1735  HGB 11.7*  --   --   --  12.1*  --  11.2*  --   --   HCT 36.6*  --   --   --  38.2*  --  35.2*  --   --   PLT 100*  --   --   --  79*  --  71*  --   --   APTT  --   < > 64* 66* 66*  --   --   --   --   HEPARINUNFRC 0.27*  --   --   --   --   --  <0.10* 0.44 0.53  CREATININE 1.38*  --   --   --  1.19 1.32* 1.28*  --   --   < > = values in this interval not displayed.  Estimated Creatinine Clearance: 58 mL/min (by C-G formula based on Cr of 1.28).  Assessment: This is a 74yo male who was on Bivalirudin with need for high risk CABG, but was changed to Heparin due to risk of severe bleeding and impact on survival with Bivalirudin.  HIT (-), SRA (p).  The IABP, in place since 5/5, is likely contributing to the decreased platelet count.  Pharmacist last night discussed heparin with Dr. Haroldine Laws who feels the pt does not have HIT and decision made to change to IV heparin.  The 6 hour heparin level this am was < 0.1 on IV heparin drip 1200 units/hr.  Patient became confused and agitated this AM due to [place] administration. RN reported that heparin line came loose, possibly off x 20min.  RN reported that heparin resumed and infusing at least 1 hour prior to RN drawing this heparin level.   Follow-up heparin level  was therapeutic (0.44) ~10:30am on 1350 units/hr.  Level has trended up to 0.53 tonight. No bleeding noted.  Goal of Therapy:  Heparin level = 0.2-0.5   Monitor platelets by anticoagulation protocol: Yes   Plan:   Decrease heparin drip to 1300 units/hr.  Next heparin level and CBC in am.  For CABG on 05/21/2014.  Kelvin Cellar, RPh Pager: [phone] 05/21/2014 7:12 PM

## 2014-05-21 NOTE — Progress Notes (Addendum)
I was called by the bedside nurse reported that patient became confused and agitated. Apparently he got a dose of Ambien for sleep earlier this night. I'm of my evaluation patient is confused and not oriented trying to hit the nurses and temper with his IVs and balloon pump. No evidence of nystagmus or tremors. No reasons to suspect lidocaine excessive tea at this moment. Appears to be well-perfused with good blood pressure and balloon pump not suspicious off low output state. Note every period of patient's agitation coincided with polymorphic VT runs on the monitor. Therefore confusion most likely resulted from Ambien administration.  Trial of Haldol 5 mg IV 2 did not result in any improvement of mental status or sedation. Patient received 6 g of magnesium IV prior to this to cover for any potential QT prolongation. Decision was made to proceed with Precedex drip for patient's safety (prevention of IABP dislodgment, losing IV access, arrhythmias)  Her Precedex drip was initiated patient sedated, calm. Hemodynamically stable. Able to protect his airway. Will plan to titrate down on Precedex drip titrate to RAAS of 0 to -1  Impression: Agitated delirium secondary to Ambien administration Cardiogenic shock secondary to severe ischemic cardiomyopathy requiring IABP and inotropic support Polymorphic VT on lidocaine and amiodarone Hypomagnesiemia  CPT 99291: 60 minutes spent at the bedside providing critical care to the patient

## 2014-05-21 NOTE — Progress Notes (Signed)
Advanced Heart Failure Rounding Note   Subjective:    86M with HTN, DM, HLD admitted with NSTEMI and new AF, RBBB, EF 25% with diffuse HK, also with WCT on telemetry. Txfer to [facility]. 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 IABP placed, started on milrinone. He than had VF arrest requiring DC-CV. Started on amio with persistent ectopy. Lidocaine started which worked well. Off milrinone. Dobutamine begun 5/6. Revascularization to be considered once more stable.  Overnight very agitated after ambien. Had multiple episodes for recurrent VT. IV amio restarted. Multiple boluses of mag.     Remains on dobutamine 5, amio 30, lido 1  Swan out. PICC in. Heparin restarted. Remains on IABP. This am sedated.   Echo reviewed personally LVEF 20-25% RV moderate to severely HK  Objective:    Vital Signs:   Temp:  [97.5 F (36.4 C)-98.8 F (37.1 C)] 98.3 F (36.8 C) (05/10 0400) Pulse Rate:  [38-198] 63 (05/10 0500) Resp:  [15-26] 16 (05/10 0500) BP: (103-157)/(33-118) 121/71 mmHg (05/10 0700) SpO2:  [97 %-100 %] 97 % (05/10 0500) Weight:  [87.7 kg (193 lb 5.5 oz)] 87.7 kg (193 lb 5.5 oz) (05/10 0500) Last BM Date: 05/21/14  Weight change: Filed Weights   05/19/14 0400 05/20/14 0300 05/21/14 0500  Weight: 88.3 kg (194 lb 10.7 oz) 89.7 kg (197 lb 12 oz) 87.7 kg (193 lb 5.5 oz)    Intake/Output:   Intake/Output Summary (Last 24 hours) at 05/21/14 0722 Last data filed at 05/21/14 0600  Gross per 24 hour  Intake 1646.83 ml  Output   4535 ml  Net -2888.17 ml     Physical Exam: General:  Sedated on vent HEENT: normal Neck: supple. JVP flat. No lymphadenopathy or thryomegaly appreciated. Cor: PMI nondisplaced. Irregular rhythm, controlled rate. No rubs, gallops or murmurs. Lungs: clear Abdomen: soft, nontender, nondistended. No hepatosplenomegaly. No bruits or masses. Good bowel sounds. Extremities: no  cyanosis, clubbing, rash, edema. L IABP sheath.  Neuro: alert & orientedx3, cranial nerves grossly intact. moves all 4 extremities w/o difficulty. Affect pleasant  Telemetry: afib rate controlled  Labs: Basic Metabolic Panel:  Recent Labs Lab 05/31/2014 2020  05/17/14 0440  05/18/14 0400 05/18/14 1623 05/19/14 0558 05/20/14 0425 05/20/14 2156 05/21/14 0345  NA 139  < > 137  < > 130* 130* 134* 131* 128* 126*  K 3.3*  < > 4.1  < > 3.8 4.3 3.9 4.5 4.4 4.3  CL 106  < > 103  < > 100* 99* 99* 98* 91* 94*  CO2 23  < > 23  < > 22 22 26 26 25 24   GLUCOSE 233*  < > 249*  < > 145* 185* 80 96 313* 366*  BUN 32*  < > 32*  < > 33* 29* 25* 21* 19 18  CREATININE 1.29*  < > 1.34*  < > 1.50* 1.58* 1.38* 1.19 1.32* 1.28*  CALCIUM 9.0  < > 8.7*  < > 8.1* 8.0* 8.4* 8.5* 8.0* 7.8*  MG 3.8*  --  2.0  --  1.7  --  1.7  --  1.6*  --   PHOS 3.7  --   --   --   --   --   --   --   --   --   < > = values in this interval not displayed.  Liver Function Tests:  Recent Labs Lab 05/15/14 0342 05/13/2014 6759  05/17/14 0440  AST 16 20 20   ALT 23 27 26   ALKPHOS 88 97 91  BILITOT 0.9 0.7 1.0  PROT 6.3* 6.5 5.8*  ALBUMIN 3.4* 3.5 3.2*   CBC:  Recent Labs Lab 05/18/2014 1630  05/17/14 1913 05/18/14 0400 05/19/14 0558 05/20/14 0425 05/21/14 0345  WBC 9.1  < > 9.7 10.5 8.9 9.0 9.8  NEUTROABS 7.1  --   --   --   --   --   --   HGB 13.0  < > 11.9* 11.2* 11.7* 12.1* 11.2*  HCT 42.0  < > 36.9* 35.4* 36.6* 38.2* 35.2*  MCV 78.1  < > 75.6* 75.2* 74.2* 74.6* 74.7*  PLT 271  < > 129* 125* 100* 79* 71*  < > = values in this interval not displayed.  Cardiac Enzymes:  Recent Labs Lab 05/13/2014 1630 05/31/2014 1730 05/24/2014 2114 05/15/14 0342 05/15/14 0900  TROPONINI 0.40* 0.39* 0.45* 0.37* 0.35*    BNP: BNP (last 3 results)  Recent Labs  05/15/2014 1630  BNP 885.0*    ProBNP (last 3 results) No results for input(s): PROBNP in the last 8760 hours.    Other results:  Imaging: Dg Chest Port  1 View  05/20/2014   CLINICAL DATA:  PICC insertion.  Myocardial infarction.  EXAM: PORTABLE CHEST - 1 VIEW 7:18 p.m.  COMPARISON:  05/19/2014 at 9:57 a.m.  FINDINGS: Right PICC is been inserted and the tip is at the level of the carina in good position. Aortic balloon in catheter in place, unchanged. Heart size and pulmonary vascularity are normal. Lungs are clear.  IMPRESSION: PICC tip in good position. Swan-Ganz catheter has been removed. Lungs are now clear.   Electronically Signed   By: Lorriane Shire M.D.   On: 05/20/2014 19:40   Dg Chest Port 1 View  05/19/2014   CLINICAL DATA:  Followup CHF.  EXAM: PORTABLE CHEST - 1 VIEW  COMPARISON:  05/24/2014.  FINDINGS: Enlarged cardiac silhouette with improvement. Mild decrease in prominence of the pulmonary vasculature and interstitial markings. No definite pleural fluid. Right jugular Swan-Ganz catheter tip in a right lower lobe pulmonary artery. Thoracic spine degenerative changes. Bilateral shoulder degenerative changes with superior migration of the humeral heads, compatible with chronic rotator cuff tears.  IMPRESSION: Improving cardiomegaly and changes of congestive heart failure.   Electronically Signed   By: Claudie Revering M.D.   On: 05/19/2014 10:18     Medications:     Scheduled Medications: . amiodarone      . antiseptic oral rinse  7 mL Mouth Rinse BID  . aspirin EC  81 mg Oral Daily  . atorvastatin  80 mg Oral QHS  . ceFEPime (MAXIPIME) IV  1 g Intravenous 3 times per day  . furosemide  40 mg Oral Daily  . glipiZIDE  5 mg Oral BID AC  . hydrALAZINE  12.5 mg Oral TID  . insulin aspart  0-20 Units Subcutaneous TID WC  . insulin aspart  0-5 Units Subcutaneous QHS  . insulin glargine  10 Units Subcutaneous QHS  . levothyroxine  50 mcg Oral QAC breakfast  . multivitamin with minerals  1 tablet Oral Daily  . potassium chloride  40 mEq Oral BID  . sodium chloride  10-40 mL Intracatheter Q12H  . sodium chloride  3 mL Intravenous Q12H     Infusions: . amiodarone    . dexmedetomidine 0.9 mcg/kg/hr (05/21/14 0600)  . DOBUTamine 5 mcg/kg/min (05/20/14 1512)  . heparin    .  lidocaine 1 mg/min (05/20/14 1720)  . norepinephrine (LEVOPHED) Adult infusion Stopped (05/18/14 0440)    PRN Medications: sodium chloride, acetaminophen, haloperidol lactate, morphine injection, ondansetron (ZOFRAN) IV, sodium chloride   Assessment:   1. Cardiogenic shock 2. Ventricular fibrillation arrest 5/6 in setting of milrinone use 3. 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 3v CAD/NSTEMI: Occluded RCA, subtotally occluded prox LAD, 90% LCx 5. DM2 6. Atrial fibrillation, newly diagnosed on admission 7. Microcytic anemia/thrombocytopenia  8. Acute kidney injury, improving 9. 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 Thrombocytopenia 11. Acute delirium  Plan/Discussion:    Very difficult night with increased agitation and VT after ambien. Now sedated with Precedex. Will continue Precedex until noon and then try to wean.   Otherwise continue dobutamine and IABP support. Continue dobutamine, lido and amio. Failed amio wean yesterday ,  He has been seen by Dr. Prescott Gum and possible high-risk CABG on Thursday. I discussed potential need for mechanical support as a possible temporary bail out strategy.  Heparin restarted. Platelets down slightly. Doubt he has HIT likely mechanical destruction from pump. Await HIT panel and SRA.   Need to keep Foley in for now due to need for urtehral dilations. On empiric abx.   The patient is critically ill with multiple organ systems failure and requires high complexity decision making for assessment and support, frequent evaluation and titration of therapies, application of advanced monitoring technologies and extensive interpretation of multiple databases.   Critical Care Time devoted to patient care  services described in this note is 45 Minutes.  Bensimhon, Daniel,MD 7:22 AM

## 2014-05-21 NOTE — Progress Notes (Signed)
This evening pt was A&O x4; vital signs stable.He was very concerned about not being able to sleep and he requested the sleeping pill ordered in his PRN meds, Ambien 5mg . Upon giving dose pt was still A&O x4 and was sleeping well for about 1 hour. Nurse noticed around 1:30am that pt was moving a lot in bed and trying to pick at his pulse ox, but he still understood where he was and the importance of not moving his left leg due to the IABP. Approximately 12mins after this pt became very confused and agitated. He refused to follow commands and squirming in bed. It took three other nurses to help prevent the pt from dislodging IABP. Cardiology MD was paged and arrived at bedside. Pt then became very combative by trying to grab and bite. Orders were placed by cardiology to help calm pt, see Mar for medications. Pt finally relaxed and feel asleep after starting a precedex drip. Pt currently resting with HR 70's, BP 105/56 MAP (72), O2 97%. Please see MD note.

## 2014-05-21 NOTE — Progress Notes (Signed)
Inpatient Diabetes Program Recommendations  AACE/ADA: New Consensus Statement on Inpatient Glycemic Control (2013)  Target Ranges:  Prepandial:   less than 140 mg/dL      Peak postprandial:   less than 180 mg/dL (1-2 hours)      Critically ill patients:  140 - 180 mg/dL   Reason for Assessment:  Results for KIMO, BANCROFT (MRN [id number]) as of 05/21/2014 13:02  Ref. Range 05/20/2014 07:27 05/20/2014 11:58 05/20/2014 17:15 05/20/2014 20:16 05/21/2014 08:18  Glucose-Capillary Latest Ref Range: 70-99 mg/dL 89 159 (H) 132 (H) 184 (H) 222 (H)    Diabetes history: Type 2  Spoke to RN regarding patient's status today.  Note that fasting CBG yesterday morning was 89 mg/dL with Lantus 10 units.  She states that patient did get agitated overnight which could have contributed to elevated lab glucose?  RN states that she has not given Lantus 20 units this morning due to patient not eating due to lethargy.  Consider reducing Lantus back to 10 units q HS and change Novolog correction to sensitive q 4 hours.  Note patient will likely start insulin drip prior to surgery per protocol.  Discussed with RN.  She states she will address with MD this afternoon.  Thanks, Adah Perl, RN, BC-ADM Inpatient Diabetes Coordinator Pager [phone] (8a-5p)

## 2014-05-21 NOTE — Progress Notes (Signed)
Medicare Important Message given? YES  Date Medicare IM given:  05/21/2014 Medicare IM given by: Hykeem Ojeda  

## 2014-05-21 NOTE — Progress Notes (Signed)
ANTICOAGULATION CONSULT NOTE - follow up  Pharmacy Consult for Heparin Indication: IABP  Allergies  Allergen Reactions  . Penicillins Rash    Patient Measurements: Height: 5\' 9"  (175.3 cm) Weight: 197 lb 12 oz (89.7 kg) IBW/kg (Calculated) : 70.7 Heparin Dosing Weight: 89 kg   Vital Signs: Temp: 98.3 F (36.8 C) (05/10 0000) Temp Source: Oral (05/10 0000) BP: 130/86 mmHg (05/10 0300) Pulse Rate: 97 (05/10 0300)  Labs:  Recent Labs  05/19/14 0558  05/19/14 1500 05/19/14 1827 05/20/14 0425 05/20/14 2156 05/21/14 0345  HGB 11.7*  --   --   --  12.1*  --  11.2*  HCT 36.6*  --   --   --  38.2*  --  35.2*  PLT 100*  --   --   --  79*  --  71*  APTT  --   < > 64* 66* 66*  --   --   HEPARINUNFRC 0.27*  --   --   --   --   --  <0.10*  CREATININE 1.38*  --   --   --  1.19 1.32*  --   < > = values in this interval not displayed.  Estimated Creatinine Clearance: 56.8 mL/min (by C-G formula based on Cr of 1.32).   Medical History: Past Medical History  Diagnosis Date  . Type 2 diabetes mellitus   . Hypothyroidism   . Hyperlipidemia   . Essential hypertension   . Insomnia   . Depression   . Atrial fibrillation     Diagnosed May 2016    Medications:  Scheduled:  . amiodarone      . antiseptic oral rinse  7 mL Mouth Rinse BID  . aspirin EC  81 mg Oral Daily  . atorvastatin  80 mg Oral QHS  . ceFEPime (MAXIPIME) IV  1 g Intravenous 3 times per day  . furosemide  40 mg Oral Daily  . glipiZIDE  5 mg Oral BID AC  . haloperidol lactate      . haloperidol lactate  5 mg Intravenous Once  . hydrALAZINE  12.5 mg Oral TID  . insulin aspart  0-20 Units Subcutaneous TID WC  . insulin aspart  0-5 Units Subcutaneous QHS  . insulin glargine  10 Units Subcutaneous QHS  . levothyroxine  50 mcg Oral QAC breakfast  . magnesium sulfate      . multivitamin with minerals  1 tablet Oral Daily  . potassium chloride  40 mEq Oral BID  . sodium chloride  10-40 mL Intracatheter Q12H   . sodium chloride  3 mL Intravenous Q12H    Assessment: This is a 74yo male who was on Bivalirudin with need for high risk CABG, but was changed to Heparin due to risk of severe bleeding and impact on survival with Bivalirudin.  HIT (-), SRA (p).  The IABP, in place since 5/5, is likely contributing to the decreased pltc.  Pharmacist last night discussed heparin with Dr. Haroldine Laws who feels the pt does not have HIT and decision made to change to IV heparin.  The 6 hour heparin level is < 0.1 on IV heparin drip 1200 units/hr.  Patient became confused and agitated this AM due to [place] administration. RN reports that heparin line came loose, possibly off x 18min.  RN reports heparin resumed and infusing at least 1 hour prior to RN drawing this heparin level. No bleeding noted.   Goal of Therapy:  Heparin level = 0.2-0.5  Monitor platelets by anticoagulation protocol: Yes   Plan:  Increase Heparin 1350 units/hr, no bolus- check Heparin level 6hr Daily HL, CBC RN to discontinue the [place] and I have entered [place] as allergy (intolerance).   Nicole Cella, RPh Clinical Pharmacist Pager: [phone] 05/21/2014 4:29 AM

## 2014-05-21 NOTE — Consult Note (Signed)
[address]       [place] [address]             [phone]        Jeffrey [name] [facility] Medical Record #[id number] Date of Birth: [birth year]  Referring: No ref. provider found Primary Care: Leonides Grills, MD  Chief Complaint:    Chief Complaint  Patient presents with  . Shortness of Breath  . Nausea   patient examined, echocardiogram and cardiac catheterization and chest x-rays reviewed. Patient discussed with his cardiologist, Dr. Haroldine Laws  History of Present Illness:    74 year old Caucasian male diabetic remote smoker with history of hypertension and atrial fibrillation presented with a non-ST elevation MI, hypertension and interstitial pulmonary edema. Cardiac echocardiogram showed severe LV dysfunction-EF 20%. 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 A PA catheter and balloon pump replaced for evidence of cardiogenic shock and low cardiac output. Patient placed on Milrinone initially but had significant ventricular ectopy and this was switched to dobutamine. The patient stabilized with improved renal function improved symptoms and improved cardiac output. The patient was placed on heparin and had a progressive decline and platelet count. A HRT panel was submitted and the heparin was converted to bivalirudin 48 hours of therapy and then reverted back to heparin. The patient's cardiology team felt the patient was an appropriate but high risk patient for multivessel CABG and I was asked to evaluate the patient. Current Activity/ Functional Status: The patient had minimal exercise tolerance at home.   Zubrod Score: At the time of surgery this patient's most appropriate activity status/level should be described as: []     0    Normal activity, no symptoms []     1    Restricted in physical strenuous activity but ambulatory, able to do out light work []     2    Ambulatory and capable of self  care, unable to do work activities, up and about                 more than 50%  Of the time                            []     3    Only limited self care, in bed greater than 50% of waking hours [x]     4    Completely disabled, no self care, confined to bed or chair []     5    Moribund  Past Medical History  Diagnosis Date  . Type 2 diabetes mellitus   . Hypothyroidism   . Hyperlipidemia   . Essential hypertension   . Insomnia   . Depression   . Atrial fibrillation     Diagnosed May 2016    Past Surgical History  Procedure Laterality Date  . Appendectomy    . Shoulder surgery    . Colonoscopy  04/30/2011    Procedure: COLONOSCOPY;  Surgeon: Danie Binder, MD;  Location: [facility];  Service: Endoscopy;  Laterality: N/A;  8:30 AM  . Cardiac catheterization N/A 05/17/2014    Procedure: Right/Left Heart Cath and Coronary Angiography;  Surgeon: [name], MD;  Location: [facility] INVASIVE CV LAB CUPID;  Service: Cardiovascular;  Laterality: N/A;  . Cardiac catheterization N/A 05/13/2014    Procedure: IABP Insertion;  Surgeon: [name], MD;  Location: [facility] INVASIVE CV LAB CUPID;  Service: Cardiovascular;  Laterality:  N/A;  . Right heart catheterization N/A 05/17/2014    Procedure: Right Heart Cath Additional;  Surgeon: Jolaine Artist, MD;  Location: [place] INVASIVE CV LAB CUPID;  Service: Cardiovascular;  Laterality: N/A;    History  Smoking status  . Former Smoker -- 0.50 packs/day  . Types: Cigarettes  . Quit date: 08/18/1990  Smokeless tobacco  . Never Used    History  Alcohol Use No    History   Social History  . Marital Status: Single    Spouse Name: N/A  . Number of Children: N/A  . Years of Education: N/A   Occupational History  . Not on file.   Social History Main Topics  . Smoking status: Former Smoker -- 0.50 packs/day    Types: Cigarettes    Quit date: 08/18/1990  . Smokeless tobacco: Never Used  . Alcohol Use: No  . Drug Use: No  . Sexual Activity: No    Other Topics Concern  . Not on file   Social History Narrative    Allergies  Allergen Reactions  . Ambien [Zolpidem Tartrate] Other (See Comments)    Agitated delirium secondary to Ambien administration on 05/21/14  . Penicillins Rash    Current Facility-Administered Medications  Medication Dose Route Frequency Provider Last Rate Last Dose  . 0.9 %  sodium chloride infusion  250 mL Intravenous PRN [name], MD   Stopped at 05/17/14 1313  . acetaminophen (TYLENOL) tablet 650 mg  650 mg Oral Q4H PRN [name], MD      . amiodarone (NEXTERONE PREMIX) 360 MG/200ML (1.8 mg/mL) IV infusion  30 mg/hr Intravenous Continuous Arnoldo Lenis, MD 16.7 mL/hr at 05/21/14 1323 30 mg/hr at 05/21/14 1323  . antiseptic oral rinse (CPC / CETYLPYRIDINIUM CHLORIDE 0.05%) solution 7 mL  7 mL Mouth Rinse BID Larey Dresser, MD   7 mL at 05/20/14 2200  . aspirin EC tablet 81 mg  81 mg Oral Daily Doree Albee, MD   81 mg at 05/20/14 0906  . atorvastatin (LIPITOR) tablet 80 mg  80 mg Oral QHS Larey Dresser, MD   80 mg at 05/20/14 2117  . ceFEPIme (MAXIPIME) 1 g in dextrose 5 % 50 mL IVPB  1 g Intravenous 3 times per day Jolaine Artist, MD   1 g at 05/21/14 1324  . dexmedetomidine (PRECEDEX) 400 MCG/100ML (4 mcg/mL) infusion  0.4-1.2 mcg/kg/hr Intravenous Titrated Inez Pilgrim, MD 11.2 mL/hr at 05/21/14 1400 0.5 mcg/kg/hr at 05/21/14 1400  . DOBUTamine (DOBUTREX) infusion 4000 mcg/mL  2.5-20 mcg/kg/min Intravenous Titrated Jolaine Artist, MD 6.8 mL/hr at 05/21/14 1322 5 mcg/kg/min at 05/21/14 1322  . furosemide (LASIX) tablet 40 mg  40 mg Oral Daily Larey Dresser, MD   40 mg at 05/20/14 0906  . glipiZIDE (GLUCOTROL) tablet 5 mg  5 mg Oral BID AC Nimish C Gosrani, MD   5 mg at 05/20/14 1719  . haloperidol lactate (HALDOL) injection 5 mg  5 mg Intravenous Q6H PRN Inez Pilgrim, MD   5 mg at 05/21/14 0230  . heparin ADULT infusion 100 units/mL (25000 units/250 mL)  1,350 Units/hr  Intravenous Continuous Wendee Beavers, RPH 13.5 mL/hr at 05/21/14 1321 1,350 Units/hr at 05/21/14 1321  . hydrALAZINE (APRESOLINE) tablet 12.5 mg  12.5 mg Oral TID Jolaine Artist, MD   12.5 mg at 05/20/14 2118  . insulin aspart (novoLOG) injection 0-20 Units  0-20 Units Subcutaneous TID WC Nimish Luther Parody, MD   4  Units at 05/21/14 1202  . insulin aspart (novoLOG) injection 0-5 Units  0-5 Units Subcutaneous QHS Doree Albee, MD   2 Units at 05/17/14 2147  . insulin glargine (LANTUS) injection 20 Units  20 Units Subcutaneous BID Ivin Poot, MD      . levothyroxine (SYNTHROID, LEVOTHROID) tablet 50 mcg  50 mcg Oral QAC breakfast Doree Albee, MD   50 mcg at 05/20/14 0731  . lidocaine (cardiac) IV  infusion 4 mg/mL  1 mg/min Intravenous Continuous Larey Dresser, MD 15 mL/hr at 05/21/14 0800 1 mg/min at 05/21/14 0800  . morphine 4 MG/ML injection 4 mg  4 mg Intravenous Q4H PRN Nimish Luther Parody, MD   2 mg at 05/17/14 1500  . multivitamin with minerals tablet 1 tablet  1 tablet Oral Daily Doree Albee, MD   1 tablet at 05/20/14 0906  . norepinephrine (LEVOPHED) 4 mg in dextrose 5 % 250 mL (0.016 mg/mL) infusion  0-40 mcg/min Intravenous Titrated Jolaine Artist, MD   Stopped at 05/18/14 0440  . ondansetron (ZOFRAN) injection 4 mg  4 mg Intravenous Q6H PRN [name], MD      . potassium chloride SA (K-DUR,KLOR-CON) CR tablet 40 mEq  40 mEq Oral BID Larey Dresser, MD   40 mEq at 05/20/14 2117  . sodium chloride 0.9 % injection 10-40 mL  10-40 mL Intracatheter Q12H Belva Crome, MD   10 mL at 05/21/14 0027  . sodium chloride 0.9 % injection 10-40 mL  10-40 mL Intracatheter PRN Belva Crome, MD      . sodium chloride 0.9 % injection 3 mL  3 mL Intravenous Q12H Doree Albee, MD   3 mL at 05/21/14 0027    Prescriptions prior to admission  Medication Sig Dispense Refill Last Dose  . aspirin EC 81 MG tablet Take 81 mg by mouth daily.   05/30/2014 at Unknown time  .  atorvastatin (LIPITOR) 20 MG tablet Take 1 tablet by mouth at bedtime.    05/13/2014 at Unknown time  . fish oil-omega-3 fatty acids 1000 MG capsule Take 1,200 mg by mouth daily.   05/13/2014 at Unknown time  . glipiZIDE (GLUCOTROL) 5 MG tablet Take 1 tablet by mouth 2 (two) times daily.   05/29/2014 at Unknown time  . levothyroxine (SYNTHROID, LEVOTHROID) 50 MCG tablet Take 1 tablet by mouth daily.   05/13/2014 at Unknown time  . lisinopril (PRINIVIL,ZESTRIL) 20 MG tablet Take 20 mg by mouth at bedtime.    05/13/2014 at Unknown time  . metFORMIN (GLUCOPHAGE) 500 MG tablet Take 2 tablets by mouth 2 (two) times daily.   05/22/2014 at Unknown time  . Multiple Vitamin (MULTIVITAMIN) tablet Take 1 tablet by mouth daily.   05/13/2014 at Unknown time  . zolpidem (AMBIEN) 10 MG tablet Take 10 mg by mouth at bedtime as needed. For sleep    unknown  . levothyroxine (SYNTHROID, LEVOTHROID) 25 MCG tablet Take 50 mcg by mouth daily.    04/29/2011 at Unknown    Family History  Problem Relation Age of Onset  . Cancer Sister   . Colon cancer Neg Hx      Review of Systems:  Patient had history of atrial for ablation but did not been taking anticoagulation. No history of thoracic trauma pneumothorax or rib fractures  No anesthetic problems with prior surgical procedures including rotator cuff repair and appendectomy     Cardiac Review of Systems: Y or N  Chest  Pain [place].Blacker    ]  Resting SOB [ yes  ] Exertional SOB  [place].Blacker  ]  Orthopnea [place].Blacker  ]   Pedal Edema [ yes  ]    Palpitations [ yes ] Syncope  [ no ]   Presyncope [place].Blacker   ]  General Review of Systems: [Y] = yes [  ]=no Constitional: recent weight change [  ]; anorexia [place].Blacker  ]; fatigue [  ]; nausea [ yes ]; night sweats [  ]; fever [ no ]; or chills [  ]                                                               Dental: poor dentition[  ]; Last Dentist visit: Greater than one year   Eye : blurred vision [  ]; diplopia [   ]; vision changes [  ];  Amaurosis fugax[   ]; Resp: cough [  ];  wheezing[  ];  hemoptysis[  ]; shortness of breath[ yes ]; paroxysmal nocturnal dyspnea[yes  ]; dyspnea on exertion[yes  ]; or orthopnea[ ES ];  GI:  gallstones[  ], vomiting[yes  ];  dysphagia[  ]; melena[  ];  hematochezia [  ]; heartburn[  ];   Hx of  Colonoscopy[ yes-no malignancy ]; GU: kidney stones [  ]; hematuria[  ];   dysuria [  ];  nocturia[  ];  history of     obstruction [yes-BPH  ]; urinary frequency [  ]             Skin: rash, swelling[  ];, hair loss[  ];  peripheral edema[  ];  or itching[  ]; Musculosketetal: myalgias[  ];  joint swelling[  ];  joint erythema[  ];  joint pain[  ];  back pain[  ];  Heme/Lymph: bruising[  ];  bleeding[  ];  anemia[  ];  Neuro: TIA[  ];  headaches[  ];  stroke[  ];  vertigo[  ];  seizures[  ];   paresthesias[  ];  difficulty walking[  ];  Psych:depression[  ]; anxiety[  ];  Endocrine: diabetes[ yes ];  thyroid dysfunction[  ];  Immunizations: Flu [  ]; Pneumococcal[  ];  Other:  Physical Exam: BP 155/63 mmHg  Pulse 174  Temp(Src) 97.8 F (36.6 C) (Axillary)  Resp 17  Ht 5\' 9"  (1.753 m)  Wt 193 lb 5.5 oz (87.7 kg)  BMI 28.54 kg/m2  SpO2 98%       Physical Exam  General: Elderly Caucasian male pleasant laying in bed with a balloon pump in CCU HEENT: Normocephalic pupils equal , dentition adequate Neck: Supple without JVD, adenopathy, or bruit Chest: Clear to auscultation, symmetrical breath sounds, no rhonchi, no tenderness             or deformity Cardiovascular: Irregular rate, atrial fibrillation rhythm, no murmur, no gallop, peripheral pulses             palpable in all extremities Abdomen:  Soft, nontender, no palpable mass or organomegaly Extremities: Warm, well-perfused, no clubbing cyanosis edema or tenderness,              no venous stasis changes of the legs Rectal/GU: Deferred Neuro: Grossly non--focal and symmetrical throughout Skin: Clean and  dry without rash or ulceration   Diagnostic  Studies & Laboratory data:     Recent Radiology Findings:   Dg Chest Port 1 View  05/21/2014   CLINICAL DATA:  Recent aortic balloon pump adjustment  EXAM: PORTABLE CHEST - 1 VIEW  COMPARISON:  05/20/2014  FINDINGS: Right-sided PICC line is again identified and stable. The intra-aortic balloon pump is again identified overlying the left mainstem bronchus. Cardiac shadow remains enlarged. The lungs are hypoinflated without focal confluent infiltrate. Crowding of the vascular markings is noted.  IMPRESSION: Tubes and lines as described.  Poor inspiratory effort with vascular crowding.   Electronically Signed   By: Inez Catalina M.D.   On: 05/21/2014 13:27   Dg Chest Port 1 View  05/20/2014   CLINICAL DATA:  PICC insertion.  Myocardial infarction.  EXAM: PORTABLE CHEST - 1 VIEW 7:18 p.m.  COMPARISON:  05/19/2014 at 9:57 a.m.  FINDINGS: Right PICC is been inserted and the tip is at the level of the carina in good position. Aortic balloon in catheter in place, unchanged. Heart size and pulmonary vascularity are normal. Lungs are clear.  IMPRESSION: PICC tip in good position. Swan-Ganz catheter has been removed. Lungs are now clear.   Electronically Signed   By: Lorriane Shire M.D.   On: 05/20/2014 19:40     I have independently reviewed the above radiologic studies.  Recent Lab Findings: Lab Results  Component Value Date   WBC 9.8 05/21/2014   HGB 11.2* 05/21/2014   HCT 35.2* 05/21/2014   PLT 71* 05/21/2014   GLUCOSE 366* 05/21/2014   CHOL 100 05/17/2014   TRIG 50 05/17/2014   HDL 35* 05/17/2014   LDLCALC 55 05/17/2014   ALT 26 05/17/2014   AST 20 05/17/2014   NA 126* 05/21/2014   K 4.3 05/21/2014   CL 94* 05/21/2014   CREATININE 1.28* 05/21/2014   BUN 18 05/21/2014   CO2 24 05/21/2014   TSH 6.794* 05/24/2014   INR 1.30 05/25/2014   HGBA1C 7.4* 05/18/2014      Assessment / Plan:     Ischemic cardiomyopathy, recent non-ST elevation MI with severe three-vessel CAD, EF 15%, cardiogenic  shock requiring pressor worse and indurated balloon pump  The patient's best long-term therapy is surgical pressurization. Coronary anatomy not consistent with PCI. The patient understands he would be at high risk for CABG because his severe LV dysfunction. I would recommend bridging the patient with mechanical support using a temporary LVAD-Impala 5.0 to be placed at the time of multivessel CABG.  We'll follow results of carotid Doppler and platelet count. I have ordered a serotonin release assay for HRT as well.      @ME1 @ 05/21/2014 2:35 PM

## 2014-05-21 NOTE — Progress Notes (Signed)
Pre-op Cardiac Surgery  Carotid Findings:  Findings suggest 1-39% right internal carotid artery stenosis and >80% left internal carotid artery stenosis. Vertebral arteries are patent with antegrade flow.  Preliminary results discussed with Junie Panning, RN and Dr. Prescott Gum.  05/21/2014 4:40 PM Seara Hinesley, RVT, RDCS, RDMS   Limb dopplers pending.  Upper Extremity Right Left  Brachial Pressures    Radial Waveforms    Ulnar Waveforms    Palmar Arch (Allen's Test)     Findings:      Lower  Extremity Right Left  Dorsalis Pedis    Anterior Tibial    Posterior Tibial    Ankle/Brachial Indices      Findings:

## 2014-05-21 NOTE — Progress Notes (Signed)
ANTICOAGULATION CONSULT NOTE - follow up  Pharmacy Consult for Heparin Indication: IABP  Allergies  Allergen Reactions  . Ambien [Zolpidem Tartrate] Other (See Comments)    Agitated delirium secondary to Ambien administration on 05/21/14  . Penicillins Rash    Patient Measurements: Height: 5\' 9"  (175.3 cm) Weight: 193 lb 5.5 oz (87.7 kg) IBW/kg (Calculated) : 70.7 Heparin Dosing Weight: 89 kg   Vital Signs: Temp: 97.8 F (36.6 C) (05/10 1200) Temp Source: Axillary (05/10 1200) BP: 145/82 mmHg (05/10 1200) Pulse Rate: 174 (05/10 0800)  Labs:  Recent Labs  05/19/14 0558  05/19/14 1500 05/19/14 1827 05/20/14 0425 05/20/14 2156 05/21/14 0345 05/21/14 1030  HGB 11.7*  --   --   --  12.1*  --  11.2*  --   HCT 36.6*  --   --   --  38.2*  --  35.2*  --   PLT 100*  --   --   --  79*  --  71*  --   APTT  --   < > 64* 66* 66*  --   --   --   HEPARINUNFRC 0.27*  --   --   --   --   --  <0.10* 0.44  CREATININE 1.38*  --   --   --  1.19 1.32* 1.28*  --   < > = values in this interval not displayed.  Estimated Creatinine Clearance: 58 mL/min (by C-G formula based on Cr of 1.28).   Medical History: Past Medical History  Diagnosis Date  . Type 2 diabetes mellitus   . Hypothyroidism   . Hyperlipidemia   . Essential hypertension   . Insomnia   . Depression   . Atrial fibrillation     Diagnosed May 2016    Medications:  Scheduled:  . antiseptic oral rinse  7 mL Mouth Rinse BID  . aspirin EC  81 mg Oral Daily  . atorvastatin  80 mg Oral QHS  . ceFEPime (MAXIPIME) IV  1 g Intravenous 3 times per day  . furosemide  40 mg Oral Daily  . glipiZIDE  5 mg Oral BID AC  . hydrALAZINE  12.5 mg Oral TID  . insulin aspart  0-20 Units Subcutaneous TID WC  . insulin aspart  0-5 Units Subcutaneous QHS  . insulin glargine  20 Units Subcutaneous BID  . levothyroxine  50 mcg Oral QAC breakfast  . multivitamin with minerals  1 tablet Oral Daily  . potassium chloride  40 mEq Oral BID   . sodium chloride  10-40 mL Intracatheter Q12H  . sodium chloride  3 mL Intravenous Q12H    Assessment: This is a 74yo male who was on Bivalirudin with need for high risk CABG, but was changed to Heparin due to risk of severe bleeding and impact on survival with Bivalirudin.  HIT (-), SRA (p).  The IABP, in place since 5/5, is likely contributing to the decreased pltc.  Pharmacist last night discussed heparin with Dr. Haroldine Laws who feels the pt does not have HIT and decision made to change to IV heparin.  The 6 hour heparin level is < 0.1 on IV heparin drip 1200 units/hr.  Patient became confused and agitated this AM due to [place] administration. RN reports that heparin line came loose, possibly off x 70min.  RN reports heparin resumed and infusing at least 1 hour prior to RN drawing this heparin level.   Follow-up heparin level is therapeutic at 0.44.  No bleeding  noted.  Goal of Therapy:  Heparin level = 0.2-0.5   Monitor platelets by anticoagulation protocol: Yes   Plan:  Continue IV Heparin 1350 units/hr. Confirm heparin level in 6 hr. Daily HL, CBC  Uvaldo Rising, BCPS  Clinical Pharmacist Pager [phone]  05/21/2014 12:51 PM

## 2014-05-22 ENCOUNTER — Encounter (HOSPITAL_COMMUNITY): Payer: Commercial Managed Care - HMO

## 2014-05-22 ENCOUNTER — Encounter (HOSPITAL_COMMUNITY): Payer: Self-pay | Admitting: Certified Registered Nurse Anesthetist

## 2014-05-22 DIAGNOSIS — I6522 Occlusion and stenosis of left carotid artery: Secondary | ICD-10-CM

## 2014-05-22 LAB — GLUCOSE, CAPILLARY
GLUCOSE-CAPILLARY: 163 mg/dL — AB (ref 70–99)
Glucose-Capillary: 222 mg/dL — ABNORMAL HIGH (ref 70–99)
Glucose-Capillary: 137 mg/dL — ABNORMAL HIGH (ref 70–99)
Glucose-Capillary: 86 mg/dL (ref 70–99)

## 2014-05-22 LAB — BLOOD GAS, ARTERIAL
ACID-BASE EXCESS: 0.6 mmol/L (ref 0.0–2.0)
Bicarbonate: 23.8 mEq/L (ref 20.0–24.0)
Drawn by: 277331
O2 Content: 3 L/min
O2 SAT: 99 %
Patient temperature: 98
TCO2: 24.8 mmol/L (ref 0–100)
pCO2 arterial: 31.8 mmHg — ABNORMAL LOW (ref 35.0–45.0)
pH, Arterial: 7.486 — ABNORMAL HIGH (ref 7.350–7.450)
pO2, Arterial: 131 mmHg — ABNORMAL HIGH (ref 80.0–100.0)

## 2014-05-22 LAB — CBC
HEMATOCRIT: 39.4 % (ref 39.0–52.0)
Hemoglobin: 12.8 g/dL — ABNORMAL LOW (ref 13.0–17.0)
MCH: 24.4 pg — AB (ref 26.0–34.0)
MCHC: 32.5 g/dL (ref 30.0–36.0)
MCV: 75.2 fL — ABNORMAL LOW (ref 78.0–100.0)
Platelets: 93 10*3/uL — ABNORMAL LOW (ref 150–400)
RBC: 5.24 MIL/uL (ref 4.22–5.81)
RDW: 16.9 % — AB (ref 11.5–15.5)
WBC: 11.6 10*3/uL — ABNORMAL HIGH (ref 4.0–10.5)

## 2014-05-22 LAB — BASIC METABOLIC PANEL
Anion gap: 10 (ref 5–15)
Anion gap: 8 (ref 5–15)
BUN: 15 mg/dL (ref 6–20)
BUN: 16 mg/dL (ref 6–20)
CALCIUM: 8.6 mg/dL — AB (ref 8.9–10.3)
CALCIUM: 8.7 mg/dL — AB (ref 8.9–10.3)
CO2: 25 mmol/L (ref 22–32)
CO2: 24 mmol/L (ref 22–32)
Chloride: 97 mmol/L — ABNORMAL LOW (ref 101–111)
Chloride: 97 mmol/L — ABNORMAL LOW (ref 101–111)
Creatinine, Ser: 1.07 mg/dL (ref 0.61–1.24)
Creatinine, Ser: 1.11 mg/dL (ref 0.61–1.24)
GFR calc Af Amer: >60 mL/min (ref >60)
GFR calc Af Amer: >60 mL/min (ref >60)
GFR calc non Af Amer: >60 mL/min (ref >60)
GLUCOSE: 145 mg/dL — AB (ref 70–99)
Glucose, Bld: 157 mg/dL — ABNORMAL HIGH (ref 70–99)
POTASSIUM: 4.3 mmol/L (ref 3.5–5.1)
Potassium: 4.6 mmol/L (ref 3.5–5.1)
Sodium: 132 mmol/L — ABNORMAL LOW (ref 135–145)
Sodium: 129 mmol/L — ABNORMAL LOW (ref 135–145)

## 2014-05-22 LAB — CARBOXYHEMOGLOBIN
CARBOXYHEMOGLOBIN: 0.9 % (ref 0.5–1.5)
CARBOXYHEMOGLOBIN: 0.8 % (ref 0.5–1.5)
METHEMOGLOBIN: 1 % (ref 0.0–1.5)
Methemoglobin: 0.8 % (ref 0.0–1.5)
O2 SAT: 86.5 %
O2 Saturation: 65.8 %
TOTAL HEMOGLOBIN: 17.1 g/dL (ref 13.5–18.0)
Total hemoglobin: 12.9 g/dL — ABNORMAL LOW (ref 13.5–18.0)

## 2014-05-22 LAB — MAGNESIUM: Magnesium: 2 mg/dL (ref 1.7–2.4)

## 2014-05-22 LAB — COMPREHENSIVE METABOLIC PANEL
ALT: 23 U/L (ref 17–63)
AST: 25 U/L (ref 15–41)
Albumin: 2.7 g/dL — ABNORMAL LOW (ref 3.5–5.0)
Alkaline Phosphatase: 105 U/L (ref 38–126)
Anion gap: 10 (ref 5–15)
BUN: 17 mg/dL (ref 6–20)
CALCIUM: 8.1 mg/dL — AB (ref 8.9–10.3)
CO2: 23 mmol/L (ref 22–32)
CREATININE: 1.2 mg/dL (ref 0.61–1.24)
Chloride: 88 mmol/L — ABNORMAL LOW (ref 101–111)
GFR calc Af Amer: >60 mL/min (ref >60)
GFR calc non Af Amer: 59 mL/min — ABNORMAL LOW (ref >60)
GLUCOSE: 299 mg/dL — AB (ref 70–99)
Potassium: 4.5 mmol/L (ref 3.5–5.1)
SODIUM: 121 mmol/L — AB (ref 135–145)
Total Bilirubin: 0.8 mg/dL (ref 0.3–1.2)
Total Protein: 6 g/dL — ABNORMAL LOW (ref 6.5–8.1)

## 2014-05-22 LAB — HEMOGLOBIN A1C
Hgb A1c MFr Bld: 7.5 % — ABNORMAL HIGH (ref 4.8–5.6)
Mean Plasma Glucose: 169 mg/dL

## 2014-05-22 LAB — HEPARIN LEVEL (UNFRACTIONATED): HEPARIN UNFRACTIONATED: 0.4 [IU]/mL (ref 0.30–0.70)

## 2014-05-22 LAB — HIT PANEL (HEPARIN AB + SRA)
Heparin Induced Plt Ab: 0.192 OD (ref 0.000–0.400)
SRA .2 IU/mL UFH Ser-aCnc: 1 % (ref 0–20)
SRA 100IU/mL UFH Ser-aCnc: 1 % (ref 0–20)

## 2014-05-22 LAB — PREPARE RBC (CROSSMATCH)

## 2014-05-22 LAB — CLOSTRIDIUM DIFFICILE BY PCR: Toxigenic C. Difficile by PCR: NEGATIVE

## 2014-05-22 MED ORDER — PLASMA-LYTE 148 IV SOLN
INTRAVENOUS | Status: AC
  Administered 2014-05-23: 500 mL
  Filled 2014-05-22: qty 2.5

## 2014-05-22 MED ORDER — DEXMEDETOMIDINE HCL IN NACL 400 MCG/100ML IV SOLN: 0.1000 ug/kg/h | INTRAVENOUS | Status: DC

## 2014-05-22 MED ORDER — EPINEPHRINE HCL 1 MG/ML IJ SOLN
0.0000 ug/min | INTRAVENOUS | Status: DC
  Filled 2014-05-22: qty 4

## 2014-05-22 MED ORDER — HYDRALAZINE HCL 25 MG PO TABS
25.0000 mg | ORAL_TABLET | Freq: Three times a day (TID) | ORAL | Status: DC
  Administered 2014-05-22 (×2): 25 mg via ORAL
  Filled 2014-05-22 (×5): qty 1

## 2014-05-22 MED ORDER — LEVOFLOXACIN IN D5W 500 MG/100ML IV SOLN
500.0000 mg | INTRAVENOUS | Status: AC
  Administered 2014-05-23: 500 mg via INTRAVENOUS
  Filled 2014-05-22 (×2): qty 100

## 2014-05-22 MED ORDER — BISACODYL 5 MG PO TBEC
5.0000 mg | DELAYED_RELEASE_TABLET | Freq: Once | ORAL | Status: DC
  Filled 2014-05-22: qty 1

## 2014-05-22 MED ORDER — SODIUM CHLORIDE 0.9 % IV SOLN
INTRAVENOUS | Status: DC
  Filled 2014-05-22: qty 30

## 2014-05-22 MED ORDER — HYDRALAZINE HCL 20 MG/ML IJ SOLN
INTRAMUSCULAR | Status: AC
  Administered 2014-05-22: 08:00:00
  Filled 2014-05-22: qty 1

## 2014-05-22 MED ORDER — DIAZEPAM 2 MG PO TABS
2.0000 mg | ORAL_TABLET | Freq: Once | ORAL | Status: AC
  Administered 2014-05-23: 2 mg via ORAL
  Filled 2014-05-22: qty 1

## 2014-05-22 MED ORDER — SODIUM CHLORIDE 0.9 % IV SOLN
INTRAVENOUS | Status: DC
  Filled 2014-05-22: qty 2.5

## 2014-05-22 MED ORDER — MAGNESIUM SULFATE 2 GM/50ML IV SOLN
INTRAVENOUS | Status: AC
  Administered 2014-05-22: 2 g
  Filled 2014-05-22: qty 50

## 2014-05-22 MED ORDER — HYDRALAZINE HCL 20 MG/ML IJ SOLN
10.0000 mg | INTRAMUSCULAR | Status: DC | PRN
  Administered 2014-05-22: 10 mg via INTRAVENOUS

## 2014-05-22 MED ORDER — DEXTROSE 5 % IV SOLN
30.0000 ug/min | INTRAVENOUS | Status: DC
  Filled 2014-05-22: qty 2

## 2014-05-22 MED ORDER — HYDRALAZINE HCL 25 MG PO TABS
12.5000 mg | ORAL_TABLET | Freq: Once | ORAL | Status: AC
  Administered 2014-05-22: 12.5 mg via ORAL
  Filled 2014-05-22: qty 0.5

## 2014-05-22 MED ORDER — MAGNESIUM SULFATE 2 GM/50ML IV SOLN: 2.0000 g | Freq: Once | INTRAVENOUS | Status: AC

## 2014-05-22 MED ORDER — VANCOMYCIN HCL 10 G IV SOLR
1500.0000 mg | INTRAVENOUS | Status: AC
  Administered 2014-05-23: 1500 mg via INTRAVENOUS
  Filled 2014-05-22: qty 1500

## 2014-05-22 MED ORDER — MAGNESIUM SULFATE 50 % IJ SOLN
40.0000 meq | INTRAMUSCULAR | Status: DC
  Filled 2014-05-22: qty 10

## 2014-05-22 MED ORDER — SODIUM CHLORIDE 0.9 % IV SOLN
INTRAVENOUS | Status: DC
  Filled 2014-05-22: qty 40

## 2014-05-22 MED ORDER — DOPAMINE-DEXTROSE 3.2-5 MG/ML-% IV SOLN
0.0000 ug/kg/min | INTRAVENOUS | Status: DC
  Filled 2014-05-22: qty 250

## 2014-05-22 MED ORDER — CHLORHEXIDINE GLUCONATE CLOTH 2 % EX PADS
6.0000 | MEDICATED_PAD | Freq: Once | CUTANEOUS | Status: AC
  Administered 2014-05-22: 6 via TOPICAL

## 2014-05-22 MED ORDER — NITROGLYCERIN IN D5W 200-5 MCG/ML-% IV SOLN
2.0000 ug/min | INTRAVENOUS | Status: DC
  Filled 2014-05-22: qty 250

## 2014-05-22 MED ORDER — CHLORHEXIDINE GLUCONATE CLOTH 2 % EX PADS
6.0000 | MEDICATED_PAD | Freq: Once | CUTANEOUS | Status: AC
  Administered 2014-05-23: 6 via TOPICAL

## 2014-05-22 MED ORDER — TEMAZEPAM 15 MG PO CAPS: 15.0000 mg | ORAL_CAPSULE | Freq: Once | ORAL | Status: AC | PRN

## 2014-05-22 MED ORDER — POTASSIUM CHLORIDE 2 MEQ/ML IV SOLN
80.0000 meq | INTRAVENOUS | Status: DC
  Filled 2014-05-22: qty 40

## 2014-05-22 NOTE — Progress Notes (Signed)
Advanced Heart Failure Rounding Note   Subjective:    21M with HTN, DM, HLD admitted with NSTEMI and new AF, RBBB, EF 25% with diffuse HK, also with WCT on telemetry. Txfer to [facility]. 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 IABP placed, started on milrinone. He than had VF arrest requiring DC-CV. Started on amio with persistent ectopy. Lidocaine started which worked well. Off milrinone. Dobutamine begun 5/6. Revascularization to be considered once more stable.  Overnight had recurrent VT. And got defibrillated x 1. Now in NSR (was in AF)  Dobutamine stopped. Now feels ok. Co-ox this am 66% on IABP>    Remains on amio 30, lido . Off precedex. No longer agitated. Platelets rebounding. BPs up over night and received hydralazine. Now 130s.   Having watery diarrhea. C.diff PCR pending.    Echo reviewed personally LVEF 20-25% RV moderate to severely HK  Objective:    Vital Signs:   Temp:  [97.5 F (36.4 C)-98.5 F (36.9 C)] 97.8 F (36.6 C) (05/11 0400) Pulse Rate:  [44-189] 85 (05/11 0700) Resp:  [15-26] 21 (05/11 0700) BP: (116-191)/(42-99) 191/78 mmHg (05/11 0732) SpO2:  [94 %-100 %] 98 % (05/11 0700) Weight:  [87.3 kg (192 lb 7.4 oz)] 87.3 kg (192 lb 7.4 oz) (05/11 0445) Last BM Date: 05/21/14  Weight change: Filed Weights   05/20/14 0300 05/21/14 0500 05/22/14 0445  Weight: 89.7 kg (197 lb 12 oz) 87.7 kg (193 lb 5.5 oz) 87.3 kg (192 lb 7.4 oz)    Intake/Output:   Intake/Output Summary (Last 24 hours) at 05/22/14 0827 Last data filed at 05/22/14 0700  Gross per 24 hour  Intake 2145.34 ml  Output   2675 ml  Net -529.66 ml     Physical Exam: General:  Awake and conversant HEENT: normal Neck: supple. JVP 7-8. No lymphadenopathy or thryomegaly appreciated. Cor: PMI nondisplaced. Regular rhythm, controlled rate. No rubs, gallops or murmurs. Lungs: clear Abdomen: soft, nontender,  nondistended. No hepatosplenomegaly. No bruits or masses. Good bowel sounds. Extremities: no cyanosis, clubbing, rash, edema. L IABP site ok Neuro: alert & orientedx3, cranial nerves grossly intact. moves all 4 extremities w/o difficulty. Affect pleasant  Telemetry: NSR with frequent polymorphic PVCs  Labs: Basic Metabolic Panel:  Recent Labs Lab 06/10/2014 2020  05/17/14 0440  05/18/14 0400  05/19/14 0558 05/20/14 0425 05/20/14 2156 05/21/14 0345 05/22/14 0045 05/22/14 0427  NA 139  < > 137  < > 130*  < > 134* 131* 128* 126* 132* 129*  K 3.3*  < > 4.1  < > 3.8  < > 3.9 4.5 4.4 4.3 4.3 4.6  CL 106  < > 103  < > 100*  < > 99* 98* 91* 94* 97* 97*  CO2 23  < > 23  < > 22  < > 26 26 25 24 25 24   GLUCOSE 233*  < > 249*  < > 145*  < > 80 96 313* 366* 157* 145*  BUN 32*  < > 32*  < > 33*  < > 25* 21* 19 18 16 15   CREATININE 1.29*  < > 1.34*  < > 1.50*  < > 1.38* 1.19 1.32* 1.28* 1.07 1.11  CALCIUM 9.0  < > 8.7*  < > 8.1*  < > 8.4* 8.5* 8.0* 7.8* 8.7* 8.6*  MG 3.8*  --  2.0  --  1.7  --  1.7  --  1.6*  --  2.0  --   PHOS 3.7  --   --   --   --   --   --   --   --   --   --   --   < > = values in this interval not displayed.  Liver Function Tests:  Recent Labs Lab 05/15/2014 0837 05/17/14 0440  AST 20 20  ALT 27 26  ALKPHOS 97 91  BILITOT 0.7 1.0  PROT 6.5 5.8*  ALBUMIN 3.5 3.2*   CBC:  Recent Labs Lab 05/18/14 0400 05/19/14 0558 05/20/14 0425 05/21/14 0345 05/22/14 0427  WBC 10.5 8.9 9.0 9.8 11.6*  HGB 11.2* 11.7* 12.1* 11.2* 12.8*  HCT 35.4* 36.6* 38.2* 35.2* 39.4  MCV 75.2* 74.2* 74.6* 74.7* 75.2*  PLT 125* 100* 79* 71* 93*    Cardiac Enzymes:  Recent Labs Lab 05/15/14 0900  TROPONINI 0.35*    BNP: BNP (last 3 results)  Recent Labs  06/10/2014 1630  BNP 885.0*    ProBNP (last 3 results) No results for input(s): PROBNP in the last 8760 hours.    Other results:  Imaging: Dg Chest Port 1 View  05/21/2014   CLINICAL DATA:  Recent aortic balloon  pump adjustment  EXAM: PORTABLE CHEST - 1 VIEW  COMPARISON:  05/20/2014  FINDINGS: Right-sided PICC line is again identified and stable. The intra-aortic balloon pump is again identified overlying the left mainstem bronchus. Cardiac shadow remains enlarged. The lungs are hypoinflated without focal confluent infiltrate. Crowding of the vascular markings is noted.  IMPRESSION: Tubes and lines as described.  Poor inspiratory effort with vascular crowding.   Electronically Signed   By: Inez Catalina M.D.   On: 05/21/2014 13:27   Dg Chest Port 1 View  05/20/2014   CLINICAL DATA:  PICC insertion.  Myocardial infarction.  EXAM: PORTABLE CHEST - 1 VIEW 7:18 p.m.  COMPARISON:  05/19/2014 at 9:57 a.m.  FINDINGS: Right PICC is been inserted and the tip is at the level of the carina in good position. Aortic balloon in catheter in place, unchanged. Heart size and pulmonary vascularity are normal. Lungs are clear.  IMPRESSION: PICC tip in good position. Swan-Ganz catheter has been removed. Lungs are now clear.   Electronically Signed   By: Lorriane Shire M.D.   On: 05/20/2014 19:40     Medications:     Scheduled Medications: . antiseptic oral rinse  7 mL Mouth Rinse BID  . aspirin EC  81 mg Oral Daily  . atorvastatin  80 mg Oral QHS  . ceFEPime (MAXIPIME) IV  1 g Intravenous 3 times per day  . furosemide  40 mg Oral Daily  . glipiZIDE  5 mg Oral BID AC  . hydrALAZINE      . hydrALAZINE  12.5 mg Oral TID  . insulin aspart  0-20 Units Subcutaneous TID WC  . insulin aspart  0-5 Units Subcutaneous QHS  . insulin glargine  20 Units Subcutaneous BID  . levothyroxine  50 mcg Oral QAC breakfast  . magnesium sulfate      . magnesium sulfate 1 - 4 g bolus IVPB  2 g Intravenous Once  . multivitamin with minerals  1 tablet Oral Daily  . potassium chloride  40 mEq Oral BID  . sodium chloride  10-40 mL Intracatheter Q12H  . sodium chloride  3 mL Intravenous Q12H    Infusions: . amiodarone 30 mg/hr (05/21/14 2352)   . dexmedetomidine Stopped (05/21/14 1445)  . DOBUTamine Stopped (05/22/14 0013)  .  heparin 1,300 Units/hr (05/22/14 0700)  . lidocaine 1 mg/min (05/22/14 0111)  . norepinephrine (LEVOPHED) Adult infusion Stopped (05/18/14 0440)    PRN Medications: sodium chloride, acetaminophen, haloperidol lactate, hydrALAZINE, morphine injection, ondansetron (ZOFRAN) IV, sodium chloride   Assessment:   1. Cardiogenic shock 2. Ventricular fibrillation arrest 5/6 in setting of milrinone use 3. 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 3v CAD/NSTEMI: Occluded RCA, subtotally occluded prox LAD, 90% LCx 5. DM2 6. Atrial fibrillation, newly diagnosed on admission -> NSR due to defib 5/10 7. Microcytic anemia/thrombocytopenia  8. Acute kidney injury, improving 9. 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 Thrombocytopenia 11. Acute delirium  Plan/Discussion:    Had recurrent VT overnight and got shocked. Now off dobutamine and co-ox stable. Remains on IABP, amio and lido.   Platelets drifting back up on heparin. HIT/SRA still pending.   He has been seen by Dr. Prescott Gum and possible high-risk CABG tomorrow with Impella 5.0 support if diarrhea clears up. He is aware of high risk nature of surgery and possible need for ongoing mechanical support and recurrent arrhythmias.   Need to keep Foley in for now due to need for urtehral dilations. On empiric abx.   The patient is critically ill with multiple organ systems failure and requires high complexity decision making for assessment and support, frequent evaluation and titration of therapies, application of advanced monitoring technologies and extensive interpretation of multiple databases.   Critical Care Time devoted to patient care services described in this note is 40 Minutes.  Bensimhon, Daniel,MD 8:27 AM

## 2014-05-22 NOTE — Progress Notes (Signed)
MD paged regarding patient's increasing BP, his SBP is now in 170s. No new orders at this time. Patient did have his Hydralazine dose at 2140.

## 2014-05-22 NOTE — Progress Notes (Signed)
[id number] Discussed with pt how important it will be to walk when able after surgery and to use IS. Pt can get to 1750 ml on IS. Has [facility] booklet and gave care guide. Encouraged pt to watch pre op video when family here. Pt will need to evaluate discharge needs after surgery as he stated daughter lives with him but cannot pull or lift. We will follow up after surgery. Graylon Good RN BSN 05/22/2014 11:56 AM

## 2014-05-22 NOTE — Progress Notes (Signed)
ANTICOAGULATION CONSULT NOTE - follow up  Pharmacy Consult for Heparin Indication: IABP  Allergies  Allergen Reactions  . Ambien [Zolpidem Tartrate] Other (See Comments)    Agitated delirium secondary to Ambien administration on 05/21/14  . Penicillins Rash    Patient Measurements: Height: 5\' 9"  (175.3 cm) Weight: 192 lb 7.4 oz (87.3 kg) IBW/kg (Calculated) : 70.7 Heparin Dosing Weight: 88 kg   Vital Signs: Temp: 97.8 F (36.6 C) (05/11 0400) Temp Source: Oral (05/11 0400) BP: 191/78 mmHg (05/11 0732) Pulse Rate: 85 (05/11 0700)  Labs:  Recent Labs  05/19/14 1500 05/19/14 1827  05/20/14 0425  05/21/14 0345 05/21/14 1030 05/21/14 1735 05/22/14 0045 05/22/14 0427  HGB  --   --   < > 12.1*  --  11.2*  --   --   --  12.8*  HCT  --   --   --  38.2*  --  35.2*  --   --   --  39.4  PLT  --   --   --  79*  --  71*  --   --   --  93*  APTT 64* 66*  --  66*  --   --   --   --   --   --   HEPARINUNFRC  --   --   --   --   < > <0.10* 0.44 0.53  --  0.40  CREATININE  --   --   --  1.19  < > 1.28*  --   --  1.07 1.11  < > = values in this interval not displayed.  Estimated Creatinine Clearance: 66.7 mL/min (by C-G formula based on Cr of 1.11).   Marland Kitchen amiodarone 30 mg/hr (05/21/14 2352)  . dexmedetomidine Stopped (05/21/14 1445)  . DOBUTamine Stopped (05/22/14 0013)  . heparin 1,300 Units/hr (05/22/14 0700)  . lidocaine 1 mg/min (05/22/14 0111)  . norepinephrine (LEVOPHED) Adult infusion Stopped (05/18/14 0440)     Assessment: This is a 74yo male who was on Bivalirudin with need for high risk CABG, but was changed to Heparin due to risk of severe bleeding and impact on survival with Bivalirudin.  HIT (-), SRA (p).  The IABP, in place since 5/5, is likely contributing to the decreased platelet count.  Dr. Haroldine Laws who feels the pt HIT unlikely and decision made to change to IV heparin.  Heparin level at goal this AM for IABP.  Platelet count actually up a bit as well.  HIT SRA  still in process.  No bleeding or complications noted.  Goal of Therapy:  Heparin level = 0.2-0.5   Monitor platelets by anticoagulation protocol: Yes   Plan:  Continue IV heparin at current rate. Next heparin level and CBC in am. For CABG on 05/22/2014.  Uvaldo Rising, BCPS  Clinical Pharmacist Pager [phone]  05/22/2014 8:24 AM

## 2014-05-22 NOTE — Progress Notes (Signed)
6 Days Post-Op Procedure(s) (LRB): Right/Left Heart Cath and Coronary Angiography (N/A) IABP Insertion (N/A) Right Heart Cath Additional (N/A) Subjective: Patient comfortable on balloon pump this morning The patient had one episode of VT not associated with chest pain last p.m. Which required shock Plan CABG with temporary LVAD in a.m. Procedure discussed with patient and his family this morning in detail including risks and benefits  Objective: Vital signs in last 24 hours: Temp:  [97.5 F (36.4 C)-98.5 F (36.9 C)] 97.8 F (36.6 C) (05/11 0400) Pulse Rate:  [44-189] 91 (05/11 0800) Cardiac Rhythm:  [-] Normal sinus rhythm (05/11 0400) Resp:  [15-26] 17 (05/11 0800) BP: (116-191)/(42-99) 138/61 mmHg (05/11 0800) SpO2:  [94 %-100 %] 94 % (05/11 0800) Weight:  [192 lb 7.4 oz (87.3 kg)] 192 lb 7.4 oz (87.3 kg) (05/11 0445)  Hemodynamic parameters for last 24 hours:   stable hemodynamics, blood pressure high Intake/Output from previous day: 05/10 0701 - 05/11 0700 In: 2224.2 [P.O.:790; I.V.:1284.2; IV Piggyback:150] Out: 3025 [Urine:3025] Intake/Output this shift: Total I/O In: 170 [P.O.:120; IV Piggyback:50] Out: 180 [Urine:180]  Lungs clear Extremities warm Neuro intact  Lab Results:  Recent Labs  05/21/14 0345 05/22/14 0427  WBC 9.8 11.6*  HGB 11.2* 12.8*  HCT 35.2* 39.4  PLT 71* 93*   BMET:  Recent Labs  05/22/14 0045 05/22/14 0427  NA 132* 129*  K 4.3 4.6  CL 97* 97*  CO2 25 24  GLUCOSE 157* 145*  BUN 16 15  CREATININE 1.07 1.11  CALCIUM 8.7* 8.6*    PT/INR: No results for input(s): LABPROT, INR in the last 72 hours. ABG    Component Value Date/Time   PHART 7.293* 06/07/2014 1456   HCO3 18.8* 06/09/2014 1456   TCO2 20 06/09/2014 1456   ACIDBASEDEF 7.0* 06/11/2014 1456   O2SAT 65.8 05/22/2014 0545   CBG (last 3)   Recent Labs  05/21/14 1559 05/21/14 2143 05/22/14 0815  GLUCAP 158* 129* 163*    Assessment/Plan: S/P Procedure(s)  (LRB): Right/Left Heart Cath and Coronary Angiography (N/A) IABP Insertion (N/A) Right Heart Cath Additional (N/A) High-risk CABG in a.m.   LOS: 8 days    Jeffrey [name] 05/22/2014

## 2014-05-22 NOTE — Care Management Note (Signed)
Case Management Note  Patient Details  Name: Jeffrey [name] MRN: [id number] Date of Birth: Nov 24, 1940  Subjective/Objective:                    Action/Plan:   Expected Discharge Date:  05/17/14               Expected Discharge Plan:  Home/Self Care  In-House Referral:  NA  Discharge planning Services  CM Consult, Medication Assistance, [facility]  Post Acute Care Choice:  NA Choice offered to:  NA  DME Arranged:    DME Agency:     HH Arranged:    HH Agency:     Status of Service:  In process, will continue to follow  Medicare Important Message Given:  Yes Date Medicare IM Given:  05/17/14 Medicare IM give by:  debbie Ario Mcdiarmid [email or web address] Date Additional Medicare IM Given:    Additional Medicare Important Message give by:     If discussed at [place] of Stay Meetings, dates discussed:  05/30/2014  Additional Comments:  Lacretia Leigh, RN 05/22/2014, 2:56 PM

## 2014-05-22 NOTE — Progress Notes (Signed)
Utilization Review Completed.Donne Anon T5/11/2014

## 2014-05-22 NOTE — Progress Notes (Signed)
Patient's SBP now in 180's and IABP assisted pressures are 130s/60s.Patient denies pain, does feel "a little nervous" due to his surgery. Dunn, PA on call notified. Patient's 1000 dose of hydralazine 12.5mg  administered now. Will continue to monitor closely.

## 2014-05-22 NOTE — Consult Note (Signed)
[name] NOTE   MRN : [id number]  Reason for Consult: carotid stenosis  Referring Physician: Dr. Tharon Aquas Trigt  History of Present Illness: 74 y/o male presented with a non-ST elevation MI, hypertension and interstitial pulmonary edema. Cardiac echocardiogram showed severe LV dysfunction-EF 20%. 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   During his pre-op work up for planned CABG it was discovered he has 1-39% right internal carotid artery stenosis and >80% left internal carotid artery stenosis.  We are being consulted for surgical planning of left CEA.  His past medical history includes: hypertension managed with Lisinopril, hyperlipidemia managed with Lipitor, as well as DM managed on PO medications.  His A fib is not managed by anticoagulation medication, he does take a daily 81 mg aspirin for heart protection.     Current Facility-Administered Medications  Medication Dose Route Frequency Provider Last Rate Last Dose  . 0.9 %  sodium chloride infusion  250 mL Intravenous PRN [name], MD   Stopped at 05/17/14 1313  . acetaminophen (TYLENOL) tablet 650 mg  650 mg Oral Q4H PRN [name], MD      . amiodarone (NEXTERONE PREMIX) 360 MG/200ML (1.8 mg/mL) IV infusion  30 mg/hr Intravenous Continuous Arnoldo Lenis, MD 16.7 mL/hr at 05/21/14 2352 30 mg/hr at 05/21/14 2352  . antiseptic oral rinse (CPC / CETYLPYRIDINIUM CHLORIDE 0.05%) solution 7 mL  7 mL Mouth Rinse BID Larey Dresser, MD   7 mL at 05/21/14 2200  . aspirin EC tablet 81 mg  81 mg Oral Daily Doree Albee, MD   81 mg at 05/22/14 0842  . atorvastatin (LIPITOR) tablet 80 mg  80 mg Oral QHS Larey Dresser, MD   80 mg at 05/21/14 2145  . ceFEPIme (MAXIPIME) 1 g in dextrose 5 % 50 mL IVPB  1 g Intravenous 3 times per day Jolaine Artist, MD   1 g at 05/22/14 0604  . dexmedetomidine (PRECEDEX) 400 MCG/100ML (4  mcg/mL) infusion  0.4-1.2 mcg/kg/hr Intravenous Titrated Inez Pilgrim, MD   Stopped at 05/21/14 1445  . DOBUTamine (DOBUTREX) infusion 4000 mcg/mL  2.5-20 mcg/kg/min Intravenous Titrated Jolaine Artist, MD   Stopped at 05/22/14 0013  . furosemide (LASIX) tablet 40 mg  40 mg Oral Daily Larey Dresser, MD   40 mg at 05/22/14 0841  . glipiZIDE (GLUCOTROL) tablet 5 mg  5 mg Oral BID AC Nimish C Gosrani, MD   5 mg at 05/22/14 0843  . haloperidol lactate (HALDOL) injection 5 mg  5 mg Intravenous Q6H PRN Inez Pilgrim, MD   5 mg at 05/21/14 0230  . heparin ADULT infusion 100 units/mL (25000 units/250 mL)  1,300 Units/hr Intravenous Continuous Skeet Simmer, RPH 13 mL/hr at 05/22/14 0700 1,300 Units/hr at 05/22/14 0700  . hydrALAZINE (APRESOLINE) 20 MG/ML injection           . hydrALAZINE (APRESOLINE) injection 10 mg  10 mg Intravenous Q4H PRN Ivin Poot, MD   10 mg at 05/22/14 0732  . hydrALAZINE (APRESOLINE) tablet 25 mg  25 mg Oral TID Jolaine Artist, MD      . insulin aspart (novoLOG) injection 0-20 Units  0-20 Units Subcutaneous TID WC Doree Albee, MD   4 Units at 05/22/14 0840  . insulin aspart (novoLOG) injection 0-5 Units  0-5 Units Subcutaneous QHS Doree Albee, MD   2 Units at 05/17/14 2147  .  insulin glargine (LANTUS) injection 20 Units  20 Units Subcutaneous BID Ivin Poot, MD   20 Units at 05/22/14 [phone]  . levothyroxine (SYNTHROID, LEVOTHROID) tablet 50 mcg  50 mcg Oral QAC breakfast Doree Albee, MD   50 mcg at 05/22/14 0841  . lidocaine (cardiac) IV  infusion 4 mg/mL  1 mg/min Intravenous Continuous Larey Dresser, MD 15 mL/hr at 05/22/14 0111 1 mg/min at 05/22/14 0111  . magnesium sulfate IVPB 2 g 50 mL  2 g Intravenous Once Ivin Poot, MD   0 g at 05/22/14 0932  . morphine 4 MG/ML injection 4 mg  4 mg Intravenous Q4H PRN Nimish Luther Parody, MD   2 mg at 05/17/14 1500  . multivitamin with minerals tablet 1 tablet  1 tablet Oral Daily Doree Albee,  MD   1 tablet at 05/22/14 0842  . norepinephrine (LEVOPHED) 4 mg in dextrose 5 % 250 mL (0.016 mg/mL) infusion  0-40 mcg/min Intravenous Titrated Jolaine Artist, MD   Stopped at 05/18/14 0440  . ondansetron (ZOFRAN) injection 4 mg  4 mg Intravenous Q6H PRN [name], MD      . potassium chloride SA (K-DUR,KLOR-CON) CR tablet 40 mEq  40 mEq Oral BID Larey Dresser, MD   40 mEq at 05/22/14 0843  . sodium chloride 0.9 % injection 10-40 mL  10-40 mL Intracatheter Q12H Ivin Poot, MD   30 mL at 05/22/14 0935  . sodium chloride 0.9 % injection 10-40 mL  10-40 mL Intracatheter PRN Ivin Poot, MD      . sodium chloride 0.9 % injection 3 mL  3 mL Intravenous Q12H Doree Albee, MD   3 mL at 05/21/14 2145    Pt meds include: Statin :Yes Betablocker: No ASA: Yes Other anticoagulants/antiplatelets:   Past Medical History  Diagnosis Date  . Type 2 diabetes mellitus   . Hypothyroidism   . Hyperlipidemia   . Essential hypertension   . Insomnia   . Depression   . Atrial fibrillation     Diagnosed May 2016    Past Surgical History  Procedure Laterality Date  . Appendectomy    . Shoulder surgery    . Colonoscopy  04/30/2011    Procedure: COLONOSCOPY;  Surgeon: Danie Binder, MD;  Location: [facility];  Service: Endoscopy;  Laterality: N/A;  8:30 AM  . Cardiac catheterization N/A 05/27/2014    Procedure: Right/Left Heart Cath and Coronary Angiography;  Surgeon: [name], MD;  Location: [facility] INVASIVE CV LAB CUPID;  Service: Cardiovascular;  Laterality: N/A;  . Cardiac catheterization N/A 06/10/2014    Procedure: IABP Insertion;  Surgeon: [name], MD;  Location: [facility] INVASIVE CV LAB CUPID;  Service: Cardiovascular;  Laterality: N/A;  . Right heart catheterization N/A 05/19/2014    Procedure: Right Heart Cath Additional;  Surgeon: Jolaine Artist, MD;  Location: [place] INVASIVE CV LAB CUPID;  Service: Cardiovascular;  Laterality: N/A;    Social History History   Substance Use Topics  . Smoking status: Former Smoker -- 0.50 packs/day    Types: Cigarettes    Quit date: 08/18/1990  . Smokeless tobacco: Never Used  . Alcohol Use: No    Family History Family History  Problem Relation Age of Onset  . Cancer Sister   . Colon cancer Neg Hx     Allergies  Allergen Reactions  . Ambien [Zolpidem Tartrate] Other (See Comments)    Agitated delirium secondary to Ambien administration on  05/21/14  . Penicillins Rash     REVIEW OF SYSTEMS  General: [ ]  Weight loss, [ ]  Fever, [ ]  chills Neurologic: [ ]  Dizziness, [ ]  Blackouts, [ ]  Seizure [ ]  Stroke, [ ]  "Mini stroke", [ ]  Slurred speech, [ ]  Temporary blindness; [ ]  weakness in arms or legs, [ ]  Hoarseness [ ]  Dysphagia Cardiac: [ ]  Chest pain/pressure, [ ]  Shortness of breath at rest [x ] Shortness of breath with exertion, [ ]  Atrial fibrillation or irregular heartbeat  Vascular: [ ]  Pain in legs with walking, [ ]  Pain in legs at rest, [ ]  Pain in legs at night,  [ ]  Non-healing ulcer, [ ]  Blood clot in vein/DVT,   Pulmonary: [ ]  Home oxygen, [x ] Productive cough, [ ]  Coughing up blood, [ ]  Asthma,  [ ]  Wheezing [ ]  COPD Musculoskeletal:  [ ]  Arthritis, [ ]  Low back pain, [ ]  Joint pain Hematologic: [ ]  Easy Bruising, [ ]  Anemia; [ ]  Hepatitis Gastrointestinal: [ ]  Blood in stool, [ ]  Gastroesophageal Reflux/heartburn, Urinary: [ ]  chronic Kidney disease, [ ]  on HD - [ ]  MWF or [ ]  TTHS, [ ]  Burning with urination, [ ]  Difficulty urinating Skin: [ ]  Rashes, [ ]  Wounds Psychological: [ ]  Anxiety, [ ]  Depression  Physical Examination Filed Vitals:   05/22/14 0600 05/22/14 0700 05/22/14 0732 05/22/14 0800  BP: 188/73 188/58 191/78 138/61  Pulse: 88 85  91  Temp:      TempSrc:      Resp: 26 21  17   Height:      Weight:      SpO2: 97% 98%  94%   Body mass index is 28.41 kg/(m^2).  General:  WDWN in NAD HENT: WNL Eyes: Pupils equal Pulmonary: normal non-labored breathing , without  Rales, rhonchi,  wheezing Cardiac: irregular rate, without  Murmurs, rubs or gallops; No carotid bruits Abdomen: soft, NT, no masses Skin: no rashes, ulcers noted;  no Gangrene , no cellulitis; no open wounds;   Vascular Exam/Pulses:palpable radial, femoral, DP, PT bilateral   Musculoskeletal: no muscle wasting or atrophy; no edema  Neurologic: A&O X 3; Appropriate Affect ;  SENSATION: normal; MOTOR FUNCTION: 5/5 Symmetric Speech is fluent/normal   Significant Diagnostic Studies: CBC Lab Results  Component Value Date   WBC 11.6* 05/22/2014   HGB 12.8* 05/22/2014   HCT 39.4 05/22/2014   MCV 75.2* 05/22/2014   PLT 93* 05/22/2014    BMET    Component Value Date/Time   NA 129* 05/22/2014 0427   K 4.6 05/22/2014 0427   CL 97* 05/22/2014 0427   CO2 24 05/22/2014 0427   GLUCOSE 145* 05/22/2014 0427   BUN 15 05/22/2014 0427   CREATININE 1.11 05/22/2014 0427   CALCIUM 8.6* 05/22/2014 0427   GFRNONAA >60 05/22/2014 0427   GFRAA >60 05/22/2014 0427   Estimated Creatinine Clearance: 66.7 mL/min (by C-G formula based on Cr of 1.11).  COAG Lab Results  Component Value Date   INR 1.30 05/29/2014     Non-Invasive Vascular Imaging:  Carotid duplex 1-39% right internal carotid artery stenosis and >80% left internal carotid artery stenosis. Vertebral arteries are patent with antegrade flow.  ASSESSMENT/PLAN:  Left carotid stenosis non-ST elevation MI Hypertension interstitial pulmonary edema  Dr. Prescott Gum is planning The patient's best long-term therapy is surgical pressurization. Coronary anatomy not consistent with PCI. The patient understands he would be at high risk for CABG because his severe LV dysfunction. I  would recommend bridging the patient with mechanical support using a temporary LVAD-Impala 5.0 to be placed at the time of multivessel CABG.    Laurence Slate [facility] 05/22/2014 9:35 AM  Agree with above assessment Patient has severe left ICA stenosis-90%  and minimal right ICA stenosis.-Asymptomatic He has high risk coronary artery bypass graft scheduled for tomorrow by Dr. Lucianne Lei trigt Patient currently on intra-aortic balloon pump and had 1 episode of V. tach last night requiring shock  I think given his cardiac situation, I would recommend proceeding with coronary artery bypass grafting and I will see patient back a few weeks following discharge and discuss left carotid endarterectomy in the near future Discussed the situation with the patient today and he is in agreement and also discussed this with Dr. Lucianne Lei trigt

## 2014-05-22 NOTE — Anesthesia Preprocedure Evaluation (Addendum)
Anesthesia Evaluation  Patient identified by MRN, date of birth, ID band Patient awake    Reviewed: Allergy & Precautions, NPO status , Patient's Chart, lab work & pertinent test results  Airway Mallampati: II  TM Distance: >3 FB Neck ROM: Full    Dental no notable dental hx.    Pulmonary neg pulmonary ROS, former smoker,  breath sounds clear to auscultation  Pulmonary exam normal       Cardiovascular hypertension, Pt. on medications + Past MI, +CHF and + DOE Normal cardiovascular exam+ dysrhythmias Atrial Fibrillation Rhythm:Regular Rate:Normal     Neuro/Psych PSYCHIATRIC DISORDERS Depression Carotid stenosis > 80 % on left    GI/Hepatic negative GI ROS, Neg liver ROS,   Endo/Other  diabetes, Type 2Hypothyroidism   Renal/GU negative Renal ROS     Musculoskeletal negative musculoskeletal ROS (+)   Abdominal   Peds  Hematology negative hematology ROS (+) anemia ,   Anesthesia Other Findings   Reproductive/Obstetrics                           Anesthesia Physical Anesthesia Plan  ASA: IV  Anesthesia Plan: General   Post-op Pain Management:    Induction: Intravenous  Airway Management Planned: Oral ETT  Additional Equipment: Arterial line, CVP, PA Cath, TEE and Ultrasound Guidance Line Placement  Intra-op Plan:   Post-operative Plan: Post-operative intubation/ventilation  Informed Consent: I have reviewed the patients History and Physical, chart, labs and discussed the procedure including the risks, benefits and alternatives for the proposed anesthesia with the patient or authorized representative who has indicated his/her understanding and acceptance.   Dental advisory given  Plan Discussed with: CRNA  Anesthesia Plan Comments:         Anesthesia Quick Evaluation

## 2014-05-22 NOTE — Significant Event (Signed)
Paged by RN to notify of VT treated with 1 shock at 120 j.  Mr Bevens developed monomorphic VT, symptomatic resulting in LOC prompting shock lasting approximately 18 sec.  Of note, he was cardioverted from A fib to NSR during above therapy.   He is currently amiodarone, lidocaine and dobutamine drips.  His O2 sat on cooximetry has been in the 70s-80 since 05/19/14.   Plan: - Continue amiodarone and lidocaine - Given VT, will wean dobutamine to 2.5 then off if remains stable given arrhythmogenic properties.   - Monitor UOP and Coox - Avoid QT prolonging agents (Haldol and zofran): QT 400 ms, QTc 500 ms - Team to discuss revascularization options in AM - check K and Mg stat  [facility] 05/22/2014 12:16 AM

## 2014-05-22 NOTE — Progress Notes (Signed)
Patient had sustained VT lasting approximately 18 seconds, he did loose his pulse and was shocked with 120 Joules, converted to SR after shock. 2 RNs at bedside at the time, zoll at bedside, code blue not initiated. A/O x4, no complaints. He did not feel the shock no pain at this time. MD notified, see note.

## 2014-05-22 NOTE — Progress Notes (Addendum)
Type and screen done yesterday 05/21/14. 3 units of blood ready per blood bank.

## 2014-05-23 ENCOUNTER — Inpatient Hospital Stay (HOSPITAL_COMMUNITY): Payer: Commercial Managed Care - HMO | Admitting: Certified Registered"

## 2014-05-23 ENCOUNTER — Inpatient Hospital Stay (HOSPITAL_COMMUNITY): Payer: Commercial Managed Care - HMO

## 2014-05-23 ENCOUNTER — Encounter (HOSPITAL_COMMUNITY)
Admission: EM | Disposition: E | Payer: Commercial Managed Care - HMO | Source: Home / Self Care | Attending: Cardiothoracic Surgery

## 2014-05-23 DIAGNOSIS — Z951 Presence of aortocoronary bypass graft: Secondary | ICD-10-CM

## 2014-05-23 DIAGNOSIS — I25110 Atherosclerotic heart disease of native coronary artery with unstable angina pectoris: Secondary | ICD-10-CM

## 2014-05-23 HISTORY — PX: PLACEMENT OF IMPELLA LEFT VENTRICULAR ASSIST DEVICE: SHX6519

## 2014-05-23 HISTORY — PX: CORONARY ARTERY BYPASS GRAFT: SHX141

## 2014-05-23 HISTORY — PX: TEE WITHOUT CARDIOVERSION: SHX5443

## 2014-05-23 LAB — POCT I-STAT 3, ART BLOOD GAS (G3+)
ACID-BASE EXCESS: 3 mmol/L — AB (ref 0.0–2.0)
Acid-base deficit: 4 mmol/L — ABNORMAL HIGH (ref 0.0–2.0)
Acid-base deficit: 3 mmol/L — ABNORMAL HIGH (ref 0.0–2.0)
Acid-base deficit: 1 mmol/L (ref 0.0–2.0)
BICARBONATE: 23.9 meq/L (ref 20.0–24.0)
Bicarbonate: 25 mEq/L — ABNORMAL HIGH (ref 20.0–24.0)
Bicarbonate: 27.1 mEq/L — ABNORMAL HIGH (ref 20.0–24.0)
Bicarbonate: 22.1 mEq/L (ref 20.0–24.0)
Bicarbonate: 22.6 mEq/L (ref 20.0–24.0)
O2 SAT: 100 %
O2 SAT: 100 %
O2 SAT: 99 %
O2 Saturation: 98 %
O2 Saturation: 86 %
PCO2 ART: 37.3 mmHg (ref 35.0–45.0)
PH ART: 7.382 (ref 7.350–7.450)
PH ART: 7.386 (ref 7.350–7.450)
PO2 ART: 350 mmHg — AB (ref 80.0–100.0)
PO2 ART: 121 mmHg — AB (ref 80.0–100.0)
Patient temperature: 36.7
TCO2: 26 mmol/L (ref 0–100)
TCO2: 28 mmol/L (ref 0–100)
TCO2: 23 mmol/L (ref 0–100)
TCO2: 24 mmol/L (ref 0–100)
TCO2: 25 mmol/L (ref 0–100)
pCO2 arterial: 42 mmHg (ref 35.0–45.0)
pCO2 arterial: 36.4 mmHg (ref 35.0–45.0)
pCO2 arterial: 42.3 mmHg (ref 35.0–45.0)
pCO2 arterial: 37 mmHg (ref 35.0–45.0)
pH, Arterial: 7.48 — ABNORMAL HIGH (ref 7.350–7.450)
pH, Arterial: 7.325 — ABNORMAL LOW (ref 7.350–7.450)
pH, Arterial: 7.415 (ref 7.350–7.450)
pO2, Arterial: 411 mmHg — ABNORMAL HIGH (ref 80.0–100.0)
pO2, Arterial: 48 mmHg — ABNORMAL LOW (ref 80.0–100.0)
pO2, Arterial: 144 mmHg — ABNORMAL HIGH (ref 80.0–100.0)

## 2014-05-23 LAB — POCT I-STAT, CHEM 8
BUN: 16 mg/dL (ref 6–20)
BUN: 15 mg/dL (ref 6–20)
BUN: 15 mg/dL (ref 6–20)
BUN: 13 mg/dL (ref 6–20)
BUN: 15 mg/dL (ref 6–20)
BUN: 15 mg/dL (ref 6–20)
BUN: 13 mg/dL (ref 6–20)
BUN: 15 mg/dL (ref 6–20)
BUN: 16 mg/dL (ref 6–20)
CALCIUM ION: 1.21 mmol/L (ref 1.13–1.30)
CALCIUM ION: 1.19 mmol/L (ref 1.13–1.30)
CHLORIDE: 92 mmol/L — AB (ref 101–111)
CHLORIDE: 91 mmol/L — AB (ref 101–111)
CHLORIDE: 92 mmol/L — AB (ref 101–111)
CHLORIDE: 89 mmol/L — AB (ref 101–111)
CHLORIDE: 90 mmol/L — AB (ref 101–111)
CHLORIDE: 92 mmol/L — AB (ref 101–111)
CHLORIDE: 93 mmol/L — AB (ref 101–111)
CREATININE: 1 mg/dL (ref 0.61–1.24)
CREATININE: 1 mg/dL (ref 0.61–1.24)
Calcium, Ion: 1.22 mmol/L (ref 1.13–1.30)
Calcium, Ion: 1.03 mmol/L — ABNORMAL LOW (ref 1.13–1.30)
Calcium, Ion: 1.22 mmol/L (ref 1.13–1.30)
Calcium, Ion: 1.08 mmol/L — ABNORMAL LOW (ref 1.13–1.30)
Calcium, Ion: 1.1 mmol/L — ABNORMAL LOW (ref 1.13–1.30)
Calcium, Ion: 1.07 mmol/L — ABNORMAL LOW (ref 1.13–1.30)
Calcium, Ion: 1.13 mmol/L (ref 1.13–1.30)
Chloride: 92 mmol/L — ABNORMAL LOW (ref 101–111)
Chloride: 91 mmol/L — ABNORMAL LOW (ref 101–111)
Creatinine, Ser: 1.1 mg/dL (ref 0.61–1.24)
Creatinine, Ser: 1 mg/dL (ref 0.61–1.24)
Creatinine, Ser: 0.8 mg/dL (ref 0.61–1.24)
Creatinine, Ser: 0.9 mg/dL (ref 0.61–1.24)
Creatinine, Ser: 1 mg/dL (ref 0.61–1.24)
Creatinine, Ser: 0.9 mg/dL (ref 0.61–1.24)
Creatinine, Ser: 1 mg/dL (ref 0.61–1.24)
GLUCOSE: 139 mg/dL — AB (ref 65–99)
GLUCOSE: 119 mg/dL — AB (ref 65–99)
GLUCOSE: 147 mg/dL — AB (ref 65–99)
Glucose, Bld: 97 mg/dL (ref 65–99)
Glucose, Bld: 145 mg/dL — ABNORMAL HIGH (ref 65–99)
Glucose, Bld: 142 mg/dL — ABNORMAL HIGH (ref 65–99)
Glucose, Bld: 139 mg/dL — ABNORMAL HIGH (ref 65–99)
Glucose, Bld: 135 mg/dL — ABNORMAL HIGH (ref 65–99)
Glucose, Bld: 145 mg/dL — ABNORMAL HIGH (ref 65–99)
HCT: 38 % — ABNORMAL LOW (ref 39.0–52.0)
HCT: 35 % — ABNORMAL LOW (ref 39.0–52.0)
HCT: 35 % — ABNORMAL LOW (ref 39.0–52.0)
HCT: 33 % — ABNORMAL LOW (ref 39.0–52.0)
HCT: 36 % — ABNORMAL LOW (ref 39.0–52.0)
HCT: 20 % — ABNORMAL LOW (ref 39.0–52.0)
HEMATOCRIT: 26 % — AB (ref 39.0–52.0)
HEMATOCRIT: 23 % — AB (ref 39.0–52.0)
HEMATOCRIT: 25 % — AB (ref 39.0–52.0)
HEMOGLOBIN: 11.2 g/dL — AB (ref 13.0–17.0)
HEMOGLOBIN: 8.5 g/dL — AB (ref 13.0–17.0)
Hemoglobin: 12.9 g/dL — ABNORMAL LOW (ref 13.0–17.0)
Hemoglobin: 11.9 g/dL — ABNORMAL LOW (ref 13.0–17.0)
Hemoglobin: 11.9 g/dL — ABNORMAL LOW (ref 13.0–17.0)
Hemoglobin: 8.8 g/dL — ABNORMAL LOW (ref 13.0–17.0)
Hemoglobin: 7.8 g/dL — ABNORMAL LOW (ref 13.0–17.0)
Hemoglobin: 12.2 g/dL — ABNORMAL LOW (ref 13.0–17.0)
Hemoglobin: 6.8 g/dL — CL (ref 13.0–17.0)
POTASSIUM: 4.2 mmol/L (ref 3.5–5.1)
POTASSIUM: 4.1 mmol/L (ref 3.5–5.1)
POTASSIUM: 4.2 mmol/L (ref 3.5–5.1)
POTASSIUM: 3.9 mmol/L (ref 3.5–5.1)
POTASSIUM: 4.2 mmol/L (ref 3.5–5.1)
POTASSIUM: 4.1 mmol/L (ref 3.5–5.1)
Potassium: 4 mmol/L (ref 3.5–5.1)
Potassium: 4.3 mmol/L (ref 3.5–5.1)
Potassium: 4.3 mmol/L (ref 3.5–5.1)
SODIUM: 125 mmol/L — AB (ref 135–145)
SODIUM: 125 mmol/L — AB (ref 135–145)
SODIUM: 124 mmol/L — AB (ref 135–145)
SODIUM: 124 mmol/L — AB (ref 135–145)
Sodium: 126 mmol/L — ABNORMAL LOW (ref 135–145)
Sodium: 125 mmol/L — ABNORMAL LOW (ref 135–145)
Sodium: 127 mmol/L — ABNORMAL LOW (ref 135–145)
Sodium: 127 mmol/L — ABNORMAL LOW (ref 135–145)
Sodium: 127 mmol/L — ABNORMAL LOW (ref 135–145)
TCO2: 24 mmol/L (ref 0–100)
TCO2: 21 mmol/L (ref 0–100)
TCO2: 23 mmol/L (ref 0–100)
TCO2: 23 mmol/L (ref 0–100)
TCO2: 24 mmol/L (ref 0–100)
TCO2: 24 mmol/L (ref 0–100)
TCO2: 21 mmol/L (ref 0–100)
TCO2: 19 mmol/L (ref 0–100)
TCO2: 21 mmol/L (ref 0–100)

## 2014-05-23 LAB — BASIC METABOLIC PANEL
Anion gap: 8 (ref 5–15)
BUN: 16 mg/dL (ref 6–20)
CO2: 24 mmol/L (ref 22–32)
CREATININE: 1.14 mg/dL (ref 0.61–1.24)
Calcium: 8.3 mg/dL — ABNORMAL LOW (ref 8.9–10.3)
Chloride: 91 mmol/L — ABNORMAL LOW (ref 101–111)
GFR calc Af Amer: >60 mL/min (ref >60)
Glucose, Bld: 150 mg/dL — ABNORMAL HIGH (ref 65–99)
Potassium: 4.5 mmol/L (ref 3.5–5.1)
SODIUM: 123 mmol/L — AB (ref 135–145)

## 2014-05-23 LAB — CBC
HCT: 33.4 % — ABNORMAL LOW (ref 39.0–52.0)
HCT: 25.5 % — ABNORMAL LOW (ref 39.0–52.0)
HCT: 21.1 % — ABNORMAL LOW (ref 39.0–52.0)
HEMOGLOBIN: 8.4 g/dL — AB (ref 13.0–17.0)
Hemoglobin: 10.9 g/dL — ABNORMAL LOW (ref 13.0–17.0)
Hemoglobin: 6.8 g/dL — CL (ref 13.0–17.0)
MCH: 24.2 pg — AB (ref 26.0–34.0)
MCH: 25.1 pg — ABNORMAL LOW (ref 26.0–34.0)
MCH: 24.5 pg — ABNORMAL LOW (ref 26.0–34.0)
MCHC: 32.6 g/dL (ref 30.0–36.0)
MCHC: 32.9 g/dL (ref 30.0–36.0)
MCHC: 32.2 g/dL (ref 30.0–36.0)
MCV: 74.2 fL — AB (ref 78.0–100.0)
MCV: 76.3 fL — ABNORMAL LOW (ref 78.0–100.0)
MCV: 75.9 fL — ABNORMAL LOW (ref 78.0–100.0)
Platelets: 88 10*3/uL — ABNORMAL LOW (ref 150–400)
Platelets: 110 10*3/uL — ABNORMAL LOW (ref 150–400)
RBC: 4.5 MIL/uL (ref 4.22–5.81)
RBC: 3.34 MIL/uL — AB (ref 4.22–5.81)
RBC: 2.78 MIL/uL — ABNORMAL LOW (ref 4.22–5.81)
RDW: 16.5 % — AB (ref 11.5–15.5)
RDW: 17.2 % — ABNORMAL HIGH (ref 11.5–15.5)
RDW: 16.8 % — ABNORMAL HIGH (ref 11.5–15.5)
WBC: 11.5 10*3/uL — ABNORMAL HIGH (ref 4.0–10.5)
WBC: 14.6 10*3/uL — ABNORMAL HIGH (ref 4.0–10.5)
WBC: 13.9 10*3/uL — ABNORMAL HIGH (ref 4.0–10.5)

## 2014-05-23 LAB — LACTATE DEHYDROGENASE: LDH: 363 U/L — AB (ref 98–192)

## 2014-05-23 LAB — GLUCOSE, CAPILLARY
GLUCOSE-CAPILLARY: 127 mg/dL — AB (ref 65–99)
Glucose-Capillary: 83 mg/dL (ref 65–99)
Glucose-Capillary: 128 mg/dL — ABNORMAL HIGH (ref 65–99)
Glucose-Capillary: 140 mg/dL — ABNORMAL HIGH (ref 65–99)

## 2014-05-23 LAB — PROTIME-INR
INR: 1.65 — ABNORMAL HIGH (ref 0.00–1.49)
Prothrombin Time: 19.7 seconds — ABNORMAL HIGH (ref 11.6–15.2)

## 2014-05-23 LAB — CARBOXYHEMOGLOBIN
CARBOXYHEMOGLOBIN: 1.1 % (ref 0.5–1.5)
Methemoglobin: 1.2 % (ref 0.0–1.5)
O2 Saturation: 73.9 %
Total hemoglobin: 10.9 g/dL — ABNORMAL LOW (ref 13.5–18.0)

## 2014-05-23 LAB — HEMOGLOBIN AND HEMATOCRIT, BLOOD
HCT: 23.5 % — ABNORMAL LOW (ref 39.0–52.0)
Hemoglobin: 7.4 g/dL — ABNORMAL LOW (ref 13.0–17.0)

## 2014-05-23 LAB — MAGNESIUM
Magnesium: 1.8 mg/dL (ref 1.7–2.4)
Magnesium: 1.7 mg/dL (ref 1.7–2.4)

## 2014-05-23 LAB — POCT I-STAT 4, (NA,K, GLUC, HGB,HCT)
Glucose, Bld: 127 mg/dL — ABNORMAL HIGH (ref 65–99)
HEMATOCRIT: 30 % — AB (ref 39.0–52.0)
HEMOGLOBIN: 10.2 g/dL — AB (ref 13.0–17.0)
POTASSIUM: 3.9 mmol/L (ref 3.5–5.1)
Sodium: 128 mmol/L — ABNORMAL LOW (ref 135–145)

## 2014-05-23 LAB — FIBRINOGEN: Fibrinogen: 360 mg/dL (ref 204–475)

## 2014-05-23 LAB — PREPARE RBC (CROSSMATCH)

## 2014-05-23 LAB — HEPARIN LEVEL (UNFRACTIONATED): Heparin Unfractionated: 0.3 IU/mL (ref 0.30–0.70)

## 2014-05-23 LAB — APTT: APTT: 37 s (ref 24–37)

## 2014-05-23 LAB — PLATELET COUNT: Platelets: 60 10*3/uL — ABNORMAL LOW (ref 150–400)

## 2014-05-23 SURGERY — CORONARY ARTERY BYPASS GRAFTING (CABG)
Anesthesia: General | Site: Chest

## 2014-05-23 MED ORDER — DEXMEDETOMIDINE HCL IN NACL 200 MCG/50ML IV SOLN
0.0000 ug/kg/h | INTRAVENOUS | Status: DC
  Administered 2014-05-23 – 2014-05-24 (×5): 0.7 ug/kg/h via INTRAVENOUS
  Filled 2014-05-23 (×5): qty 50

## 2014-05-23 MED ORDER — VASOPRESSIN 20 UNIT/ML IV SOLN
0.0300 [IU]/min | INTRAVENOUS | Status: DC
  Administered 2014-05-24 (×2): 0.02 [IU]/min via INTRAVENOUS
  Filled 2014-05-23 (×2): qty 2

## 2014-05-23 MED ORDER — VECURONIUM BROMIDE 10 MG IV SOLR
INTRAVENOUS | Status: DC | PRN
  Administered 2014-05-23 (×4): 5 mg via INTRAVENOUS

## 2014-05-23 MED ORDER — MIDAZOLAM HCL 2 MG/2ML IJ SOLN
INTRAMUSCULAR | Status: AC
  Filled 2014-05-23: qty 2

## 2014-05-23 MED ORDER — ALBUMIN HUMAN 5 % IV SOLN: 250.0000 mL | INTRAVENOUS | Status: AC | PRN

## 2014-05-23 MED ORDER — LIDOCAINE BOLUS VIA INFUSION
100.0000 mg | Freq: Once | INTRAVENOUS | Status: DC
  Filled 2014-05-23: qty 100

## 2014-05-23 MED ORDER — LEVOFLOXACIN IN D5W 750 MG/150ML IV SOLN
750.0000 mg | INTRAVENOUS | Status: AC
  Administered 2014-05-24: 750 mg via INTRAVENOUS
  Filled 2014-05-23: qty 150

## 2014-05-23 MED ORDER — VASOPRESSIN 20 UNIT/ML IV SOLN
40.0000 [IU] | INTRAVENOUS | Status: DC | PRN
  Administered 2014-05-23: 0.04 [IU]/min via INTRAVENOUS

## 2014-05-23 MED ORDER — MIDAZOLAM HCL 2 MG/2ML IJ SOLN
2.0000 mg | INTRAMUSCULAR | Status: DC | PRN
  Administered 2014-05-23 – 2014-05-24 (×12): 2 mg via INTRAVENOUS
  Filled 2014-05-23 (×12): qty 2

## 2014-05-23 MED ORDER — SUCCINYLCHOLINE CHLORIDE 20 MG/ML IJ SOLN
INTRAMUSCULAR | Status: AC
  Filled 2014-05-23: qty 1

## 2014-05-23 MED ORDER — SODIUM CHLORIDE 0.9 % IV SOLN
20.0000 ug | INTRAVENOUS | Status: DC
  Filled 2014-05-23: qty 5

## 2014-05-23 MED ORDER — SODIUM CHLORIDE 0.9 % IV SOLN
200.0000 ug | INTRAVENOUS | Status: DC | PRN
  Administered 2014-05-23: .3 ug/kg/h via INTRAVENOUS

## 2014-05-23 MED ORDER — FENTANYL CITRATE (PF) 250 MCG/5ML IJ SOLN
INTRAMUSCULAR | Status: AC
Start: 2014-05-23 — End: 2014-05-23
  Filled 2014-05-23: qty 5

## 2014-05-23 MED ORDER — ONDANSETRON HCL 4 MG/2ML IJ SOLN
4.0000 mg | Freq: Four times a day (QID) | INTRAMUSCULAR | Status: DC | PRN
Start: 2014-05-23 — End: 2014-06-01

## 2014-05-23 MED ORDER — FENTANYL CITRATE (PF) 250 MCG/5ML IJ SOLN
INTRAMUSCULAR | Status: AC
  Filled 2014-05-23: qty 5

## 2014-05-23 MED ORDER — MORPHINE SULFATE 2 MG/ML IJ SOLN: 1.0000 mg | INTRAMUSCULAR | Status: DC | PRN

## 2014-05-23 MED ORDER — LIDOCAINE IN D5W 4-5 MG/ML-% IV SOLN
2.0000 mg/min | INTRAVENOUS | Status: DC
  Administered 2014-05-23 – 2014-05-25 (×2): 1 mg/min via INTRAVENOUS
  Filled 2014-05-23 (×3): qty 500

## 2014-05-23 MED ORDER — MIDAZOLAM HCL 5 MG/5ML IJ SOLN
INTRAMUSCULAR | Status: DC | PRN
  Administered 2014-05-23: 3 mg via INTRAVENOUS
  Administered 2014-05-23 (×2): 2 mg via INTRAVENOUS
  Administered 2014-05-23: 4 mg via INTRAVENOUS
  Administered 2014-05-23: 3 mg via INTRAVENOUS

## 2014-05-23 MED ORDER — AMIODARONE HCL IN DEXTROSE 360-4.14 MG/200ML-% IV SOLN
60.0000 mg/h | INTRAVENOUS | Status: DC
  Filled 2014-05-23: qty 200

## 2014-05-23 MED ORDER — MILRINONE IN DEXTROSE 20 MG/100ML IV SOLN
INTRAVENOUS | Status: DC | PRN
  Administered 2014-05-23: .3 ug/kg/min via INTRAVENOUS

## 2014-05-23 MED ORDER — SODIUM CHLORIDE 0.9 % IV SOLN
20.0000 ug | INTRAVENOUS | Status: DC | PRN
  Administered 2014-05-23: 20 ug via INTRAVENOUS

## 2014-05-23 MED ORDER — 0.9 % SODIUM CHLORIDE (POUR BTL) OPTIME
TOPICAL | Status: DC | PRN
  Administered 2014-05-23: 6000 mL

## 2014-05-23 MED ORDER — ASPIRIN EC 325 MG PO TBEC
325.0000 mg | DELAYED_RELEASE_TABLET | Freq: Every day | ORAL | Status: DC
  Administered 2014-05-31: 325 mg via ORAL
  Filled 2014-05-23 (×9): qty 1

## 2014-05-23 MED ORDER — CHLORHEXIDINE GLUCONATE 0.12 % MT SOLN
15.0000 mL | Freq: Two times a day (BID) | OROMUCOSAL | Status: DC
  Administered 2014-05-23 – 2014-06-01 (×18): 15 mL via OROMUCOSAL
  Filled 2014-05-23 (×18): qty 15

## 2014-05-23 MED ORDER — GELATIN ABSORBABLE MT POWD
OROMUCOSAL | Status: DC | PRN
  Administered 2014-05-23 (×3): via TOPICAL

## 2014-05-23 MED ORDER — ALBUMIN HUMAN 5 % IV SOLN
INTRAVENOUS | Status: DC | PRN
  Administered 2014-05-23 (×3): via INTRAVENOUS

## 2014-05-23 MED ORDER — LACTATED RINGERS IV SOLN
INTRAVENOUS | Status: DC | PRN
  Administered 2014-05-23 (×2): via INTRAVENOUS

## 2014-05-23 MED ORDER — MILRINONE IN DEXTROSE 20 MG/100ML IV SOLN
0.2500 ug/kg/min | INTRAVENOUS | Status: DC
  Filled 2014-05-23: qty 100

## 2014-05-23 MED ORDER — INSULIN REGULAR BOLUS VIA INFUSION
0.0000 [IU] | Freq: Three times a day (TID) | INTRAVENOUS | Status: DC
  Filled 2014-05-23: qty 10

## 2014-05-23 MED ORDER — FENTANYL CITRATE (PF) 100 MCG/2ML IJ SOLN
INTRAMUSCULAR | Status: DC | PRN
  Administered 2014-05-23: 100 ug via INTRAVENOUS
  Administered 2014-05-23 (×2): 250 ug via INTRAVENOUS
  Administered 2014-05-23: 300 ug via INTRAVENOUS
  Administered 2014-05-23: 150 ug via INTRAVENOUS
  Administered 2014-05-23: 50 ug via INTRAVENOUS

## 2014-05-23 MED ORDER — MAGNESIUM SULFATE 4 GM/100ML IV SOLN
4.0000 g | Freq: Once | INTRAVENOUS | Status: AC
Start: 2014-05-23 — End: 2014-05-23
  Administered 2014-05-23: 4 g via INTRAVENOUS
  Filled 2014-05-23: qty 100

## 2014-05-23 MED ORDER — DOPAMINE-DEXTROSE 3.2-5 MG/ML-% IV SOLN
0.0000 ug/kg/min | INTRAVENOUS | Status: DC
  Administered 2014-05-24: 5 ug/kg/min via INTRAVENOUS
  Administered 2014-05-26: 4 ug/kg/min via INTRAVENOUS
  Administered 2014-05-26: 5 ug/kg/min via INTRAVENOUS
  Filled 2014-05-23 (×3): qty 250

## 2014-05-23 MED ORDER — HEPARIN SODIUM (PORCINE) 1000 UNIT/ML IJ SOLN
INTRAMUSCULAR | Status: DC | PRN
  Administered 2014-05-23: 3 mL via INTRAVENOUS
  Administered 2014-05-23: 4 mL via INTRAVENOUS
  Administered 2014-05-23: 23 mL via INTRAVENOUS

## 2014-05-23 MED ORDER — HEPARIN SODIUM (PORCINE) 1000 UNIT/ML IJ SOLN
INTRAMUSCULAR | Status: AC
  Filled 2014-05-23: qty 1

## 2014-05-23 MED ORDER — ASPIRIN 81 MG PO CHEW
324.0000 mg | CHEWABLE_TABLET | Freq: Every day | ORAL | Status: DC
  Administered 2014-05-24 – 2014-06-01 (×7): 324 mg
  Filled 2014-05-23 (×7): qty 4

## 2014-05-23 MED ORDER — LEVALBUTEROL HCL 1.25 MG/0.5ML IN NEBU
1.2500 mg | INHALATION_SOLUTION | Freq: Four times a day (QID) | RESPIRATORY_TRACT | Status: DC
  Filled 2014-05-23 (×3): qty 0.5

## 2014-05-23 MED ORDER — LACTATED RINGERS IV SOLN: INTRAVENOUS | Status: DC

## 2014-05-23 MED ORDER — FAMOTIDINE IN NACL 20-0.9 MG/50ML-% IV SOLN: 20.0000 mg | Freq: Two times a day (BID) | INTRAVENOUS | Status: DC

## 2014-05-23 MED ORDER — SODIUM CHLORIDE 0.9 % IJ SOLN
INTRAMUSCULAR | Status: AC
  Filled 2014-05-23: qty 250

## 2014-05-23 MED ORDER — FAMOTIDINE IN NACL 20-0.9 MG/50ML-% IV SOLN
20.0000 mg | Freq: Two times a day (BID) | INTRAVENOUS | Status: AC
Start: 2014-05-23 — End: 2014-05-24
  Administered 2014-05-23 – 2014-05-24 (×2): 20 mg via INTRAVENOUS
  Filled 2014-05-23: qty 50

## 2014-05-23 MED ORDER — ROCURONIUM BROMIDE 100 MG/10ML IV SOLN
INTRAVENOUS | Status: DC | PRN
  Administered 2014-05-23: 50 mg via INTRAVENOUS

## 2014-05-23 MED ORDER — DEXTROSE 5 % SOLN FOR IMPELLA PURGE CATHETER
INTRAVENOUS | Status: DC
  Filled 2014-05-23 (×4): qty 1000

## 2014-05-23 MED ORDER — EPHEDRINE SULFATE 50 MG/ML IJ SOLN
INTRAMUSCULAR | Status: AC
  Filled 2014-05-23: qty 3

## 2014-05-23 MED ORDER — SODIUM CHLORIDE 0.9 % IV SOLN
INTRAVENOUS | Status: DC
  Administered 2014-05-23 – 2014-05-25 (×3): via INTRAVENOUS
  Administered 2014-05-26: 1.7 [IU]/h via INTRAVENOUS
  Administered 2014-05-26: 1.5 [IU]/h via INTRAVENOUS
  Filled 2014-05-23 (×5): qty 2.5

## 2014-05-23 MED ORDER — SODIUM CHLORIDE 0.9 % IV SOLN
Freq: Once | INTRAVENOUS | Status: AC
Start: 2014-05-23 — End: 2014-05-23
  Administered 2014-05-23: 22:00:00 via INTRAVENOUS

## 2014-05-23 MED ORDER — ETOMIDATE 2 MG/ML IV SOLN
INTRAVENOUS | Status: DC | PRN
  Administered 2014-05-23: 8 mg via INTRAVENOUS

## 2014-05-23 MED ORDER — METOPROLOL TARTRATE 1 MG/ML IV SOLN: 2.5000 mg | INTRAVENOUS | Status: DC | PRN

## 2014-05-23 MED ORDER — DEXMEDETOMIDINE HCL IN NACL 200 MCG/50ML IV SOLN
INTRAVENOUS | Status: AC
  Filled 2014-05-23: qty 50

## 2014-05-23 MED ORDER — BISACODYL 10 MG RE SUPP
10.0000 mg | Freq: Every day | RECTAL | Status: DC
  Administered 2014-05-24 – 2014-05-29 (×3): 10 mg via RECTAL
  Filled 2014-05-23 (×3): qty 1

## 2014-05-23 MED ORDER — MORPHINE SULFATE 2 MG/ML IJ SOLN
2.0000 mg | INTRAMUSCULAR | Status: DC | PRN
  Administered 2014-05-23 – 2014-05-26 (×10): 4 mg via INTRAVENOUS
  Administered 2014-05-29: 2 mg via INTRAVENOUS
  Filled 2014-05-23 (×10): qty 2
  Filled 2014-05-23: qty 1

## 2014-05-23 MED ORDER — HEMOSTATIC AGENTS (NO CHARGE) OPTIME
TOPICAL | Status: DC | PRN
  Administered 2014-05-23: 1 via TOPICAL

## 2014-05-23 MED ORDER — PHENYLEPHRINE HCL 10 MG/ML IJ SOLN
10.0000 mg | INTRAVENOUS | Status: DC | PRN
  Administered 2014-05-23: 25 ug/min via INTRAVENOUS

## 2014-05-23 MED ORDER — SODIUM CHLORIDE 0.9 % IV SOLN
0.5000 g/h | INTRAVENOUS | Status: DC
  Filled 2014-05-23: qty 20

## 2014-05-23 MED ORDER — NITROGLYCERIN IN D5W 200-5 MCG/ML-% IV SOLN: 0.0000 ug/min | INTRAVENOUS | Status: DC

## 2014-05-23 MED ORDER — POTASSIUM CHLORIDE 10 MEQ/50ML IV SOLN
10.0000 meq | INTRAVENOUS | Status: AC
  Administered 2014-05-23 (×2): 10 meq via INTRAVENOUS

## 2014-05-23 MED ORDER — ACETAMINOPHEN 500 MG PO TABS
1000.0000 mg | ORAL_TABLET | Freq: Four times a day (QID) | ORAL | Status: AC
  Filled 2014-05-23 (×18): qty 2

## 2014-05-23 MED ORDER — LEVALBUTEROL HCL 1.25 MG/0.5ML IN NEBU
1.2500 mg | INHALATION_SOLUTION | Freq: Four times a day (QID) | RESPIRATORY_TRACT | Status: DC
  Administered 2014-05-23 – 2014-05-24 (×5): 1.25 mg via RESPIRATORY_TRACT
  Filled 2014-05-23 (×10): qty 0.5

## 2014-05-23 MED ORDER — PANTOPRAZOLE SODIUM 40 MG PO TBEC: 40.0000 mg | DELAYED_RELEASE_TABLET | Freq: Every day | ORAL | Status: DC

## 2014-05-23 MED ORDER — HEMOSTATIC AGENTS (NO CHARGE) OPTIME
TOPICAL | Status: DC | PRN
  Administered 2014-05-23 (×2): 1 via TOPICAL

## 2014-05-23 MED ORDER — SODIUM CHLORIDE 0.45 % IV SOLN
INTRAVENOUS | Status: DC | PRN
  Administered 2014-05-24: 21:00:00 via INTRAVENOUS

## 2014-05-23 MED ORDER — OXYCODONE HCL 5 MG PO TABS: 5.0000 mg | ORAL_TABLET | ORAL | Status: DC | PRN

## 2014-05-23 MED ORDER — PROTAMINE SULFATE 10 MG/ML IV SOLN
INTRAVENOUS | Status: AC
  Filled 2014-05-23: qty 25

## 2014-05-23 MED ORDER — SODIUM CHLORIDE 0.9 % IJ SOLN: 3.0000 mL | INTRAMUSCULAR | Status: DC | PRN

## 2014-05-23 MED ORDER — PROTAMINE SULFATE 10 MG/ML IV SOLN
INTRAVENOUS | Status: DC | PRN
Start: 2014-05-23 — End: 2014-05-23
  Administered 2014-05-23: 30 mg via INTRAVENOUS
  Administered 2014-05-23: 240 mg via INTRAVENOUS
  Administered 2014-05-23: 10 mg via INTRAVENOUS

## 2014-05-23 MED ORDER — ACETAMINOPHEN 160 MG/5ML PO SOLN
1000.0000 mg | Freq: Four times a day (QID) | ORAL | Status: AC
  Administered 2014-05-24 – 2014-05-28 (×14): 1000 mg
  Filled 2014-05-23 (×15): qty 40.6

## 2014-05-23 MED ORDER — MILRINONE IN DEXTROSE 20 MG/100ML IV SOLN
0.3000 ug/kg/min | INTRAVENOUS | Status: DC
  Administered 2014-05-23 – 2014-05-26 (×6): 0.3 ug/kg/min via INTRAVENOUS
  Filled 2014-05-23 (×6): qty 100

## 2014-05-23 MED ORDER — MIDAZOLAM HCL 10 MG/2ML IJ SOLN
INTRAMUSCULAR | Status: AC
Start: 2014-05-23 — End: 2014-05-23
  Filled 2014-05-23: qty 2

## 2014-05-23 MED ORDER — AMIODARONE HCL IN DEXTROSE 360-4.14 MG/200ML-% IV SOLN
60.0000 mg/h | INTRAVENOUS | Status: DC
  Administered 2014-05-24 – 2014-05-25 (×5): 30 mg/h via INTRAVENOUS
  Administered 2014-05-26 – 2014-05-31 (×18): 60 mg/h via INTRAVENOUS
  Administered 2014-05-31: 30 mg/h via INTRAVENOUS
  Administered 2014-05-31: 60 mg/h via INTRAVENOUS
  Administered 2014-05-31 (×2): 30 mg/h via INTRAVENOUS
  Administered 2014-05-31: 150 mg/h via INTRAVENOUS
  Administered 2014-05-31 – 2014-06-01 (×3): 60 mg/h via INTRAVENOUS
  Filled 2014-05-23 (×34): qty 200
  Filled 2014-05-23: qty 400
  Filled 2014-05-23 (×19): qty 200

## 2014-05-23 MED ORDER — LACTATED RINGERS IV SOLN
INTRAVENOUS | Status: DC | PRN
  Administered 2014-05-23 (×2): via INTRAVENOUS

## 2014-05-23 MED ORDER — SODIUM CHLORIDE 0.9 % IV SOLN
250.0000 [IU] | INTRAVENOUS | Status: DC | PRN
  Administered 2014-05-23: .7 [IU]/h via INTRAVENOUS

## 2014-05-23 MED ORDER — SODIUM CHLORIDE 0.9 % IV SOLN: 250.0000 mL | INTRAVENOUS | Status: DC

## 2014-05-23 MED ORDER — ROCURONIUM BROMIDE 50 MG/5ML IV SOLN
INTRAVENOUS | Status: AC
  Filled 2014-05-23: qty 1

## 2014-05-23 MED ORDER — PHENYLEPHRINE HCL 10 MG/ML IJ SOLN
INTRAMUSCULAR | Status: DC | PRN
  Administered 2014-05-23: 80 ug via INTRAVENOUS

## 2014-05-23 MED ORDER — DOCUSATE SODIUM 100 MG PO CAPS: 200.0000 mg | ORAL_CAPSULE | Freq: Every day | ORAL | Status: DC

## 2014-05-23 MED ORDER — LIDOCAINE HCL (CARDIAC) 20 MG/ML IV SOLN
INTRAVENOUS | Status: AC
Start: 2014-05-23 — End: 2014-05-23
  Filled 2014-05-23: qty 5

## 2014-05-23 MED ORDER — STERILE WATER FOR INJECTION IJ SOLN
INTRAMUSCULAR | Status: AC
  Filled 2014-05-23: qty 30

## 2014-05-23 MED ORDER — TRAMADOL HCL 50 MG PO TABS: 50.0000 mg | ORAL_TABLET | ORAL | Status: DC | PRN

## 2014-05-23 MED ORDER — MAGNESIUM SULFATE 2 GM/50ML IV SOLN
2.0000 g | Freq: Once | INTRAVENOUS | Status: AC
  Administered 2014-05-23: 2 g via INTRAVENOUS
  Filled 2014-05-23: qty 50

## 2014-05-23 MED ORDER — PHENYLEPHRINE HCL 10 MG/ML IJ SOLN
0.0000 ug/min | INTRAVENOUS | Status: DC
  Filled 2014-05-23: qty 2

## 2014-05-23 MED ORDER — VASOPRESSIN 20 UNIT/ML IV SOLN
0.0300 [IU]/min | INTRAVENOUS | Status: DC
  Filled 2014-05-23: qty 2

## 2014-05-23 MED ORDER — LACTATED RINGERS IV SOLN: 500.0000 mL | Freq: Once | INTRAVENOUS | Status: AC | PRN

## 2014-05-23 MED ORDER — VECURONIUM BROMIDE 10 MG IV SOLR
INTRAVENOUS | Status: AC
  Filled 2014-05-23: qty 30

## 2014-05-23 MED ORDER — VANCOMYCIN HCL IN DEXTROSE 1-5 GM/200ML-% IV SOLN
1000.0000 mg | Freq: Two times a day (BID) | INTRAVENOUS | Status: AC
  Administered 2014-05-23 – 2014-05-24 (×3): 1000 mg via INTRAVENOUS
  Filled 2014-05-23 (×3): qty 200

## 2014-05-23 MED ORDER — ACETAMINOPHEN 160 MG/5ML PO SOLN: 650.0000 mg | Freq: Once | ORAL | Status: AC

## 2014-05-23 MED ORDER — LACTATED RINGERS IV SOLN
INTRAVENOUS | Status: DC
  Administered 2014-05-26: 11:00:00 via INTRAVENOUS

## 2014-05-23 MED ORDER — ETOMIDATE 2 MG/ML IV SOLN
INTRAVENOUS | Status: AC
  Filled 2014-05-23: qty 10

## 2014-05-23 MED ORDER — METOPROLOL TARTRATE 12.5 MG HALF TABLET
12.5000 mg | ORAL_TABLET | Freq: Two times a day (BID) | ORAL | Status: DC
  Filled 2014-05-23 (×7): qty 1

## 2014-05-23 MED ORDER — LACTATED RINGERS IV SOLN
INTRAVENOUS | Status: DC | PRN
  Administered 2014-05-23 (×3): via INTRAVENOUS

## 2014-05-23 MED ORDER — DOPAMINE-DEXTROSE 1.6-5 MG/ML-% IV SOLN
INTRAVENOUS | Status: DC | PRN
  Administered 2014-05-23: 5 ug/kg/min via INTRAVENOUS

## 2014-05-23 MED ORDER — MILRINONE LOAD VIA INFUSION
INTRAVENOUS | Status: DC | PRN
  Administered 2014-05-23: 4500 ug via INTRAVENOUS

## 2014-05-23 MED ORDER — SODIUM CHLORIDE 0.9 % IV SOLN
10.0000 g | INTRAVENOUS | Status: DC | PRN
  Administered 2014-05-23: 5 g/h via INTRAVENOUS

## 2014-05-23 MED ORDER — ACETAMINOPHEN 650 MG RE SUPP
650.0000 mg | Freq: Once | RECTAL | Status: AC
  Administered 2014-05-23: 650 mg via RECTAL

## 2014-05-23 MED ORDER — SODIUM CHLORIDE 0.9 % IV SOLN
INTRAVENOUS | Status: DC
  Administered 2014-05-27: 02:00:00 via INTRAVENOUS
  Administered 2014-05-27: 20 mL via INTRAVENOUS
  Administered 2014-06-01: 10 mL/h via INTRAVENOUS

## 2014-05-23 MED ORDER — BISACODYL 5 MG PO TBEC
10.0000 mg | DELAYED_RELEASE_TABLET | Freq: Every day | ORAL | Status: DC
Start: 2014-05-24 — End: 2014-06-01

## 2014-05-23 MED ORDER — VASOPRESSIN 20 UNIT/ML IV SOLN
INTRAVENOUS | Status: DC | PRN
  Administered 2014-05-23: .5 [IU] via INTRAVENOUS

## 2014-05-23 MED ORDER — CETYLPYRIDINIUM CHLORIDE 0.05 % MT LIQD
7.0000 mL | Freq: Four times a day (QID) | OROMUCOSAL | Status: DC
  Administered 2014-05-24 – 2014-06-01 (×35): 7 mL via OROMUCOSAL

## 2014-05-23 MED ORDER — NOREPINEPHRINE BITARTRATE 1 MG/ML IV SOLN
4000.0000 ug | INTRAVENOUS | Status: DC | PRN
  Administered 2014-05-23: 5 ug/min via INTRAVENOUS

## 2014-05-23 MED ORDER — SODIUM CHLORIDE 0.9 % IJ SOLN
3.0000 mL | Freq: Two times a day (BID) | INTRAMUSCULAR | Status: DC
  Administered 2014-05-24 – 2014-05-26 (×4): 3 mL via INTRAVENOUS
  Administered 2014-05-27 – 2014-05-28 (×3): 10 mL via INTRAVENOUS
  Administered 2014-05-28 – 2014-05-31 (×4): 3 mL via INTRAVENOUS

## 2014-05-23 MED ORDER — PROTAMINE SULFATE 10 MG/ML IV SOLN
INTRAVENOUS | Status: AC
  Filled 2014-05-23: qty 5

## 2014-05-23 MED ORDER — NOREPINEPHRINE BITARTRATE 1 MG/ML IV SOLN
0.0000 ug/min | INTRAVENOUS | Status: DC
  Administered 2014-05-23 (×2): 18 ug/min via INTRAVENOUS
  Administered 2014-05-24: 10 ug/min via INTRAVENOUS
  Administered 2014-05-24: 12 ug/min via INTRAVENOUS
  Administered 2014-05-24: 10 ug/min via INTRAVENOUS
  Filled 2014-05-23 (×5): qty 4

## 2014-05-23 MED ORDER — VANCOMYCIN HCL IN DEXTROSE 1-5 GM/200ML-% IV SOLN
1000.0000 mg | Freq: Once | INTRAVENOUS | Status: DC
  Filled 2014-05-23: qty 200

## 2014-05-23 MED ORDER — EPHEDRINE SULFATE 50 MG/ML IJ SOLN
INTRAMUSCULAR | Status: DC | PRN
  Administered 2014-05-23: 15 mg via INTRAVENOUS

## 2014-05-23 MED ORDER — METOPROLOL TARTRATE 25 MG/10 ML ORAL SUSPENSION
12.5000 mg | Freq: Two times a day (BID) | ORAL | Status: DC
  Administered 2014-05-25: 12.5 mg
  Filled 2014-05-23 (×7): qty 5

## 2014-05-23 MED ORDER — PROPOFOL 10 MG/ML IV BOLUS
INTRAVENOUS | Status: AC
  Filled 2014-05-23: qty 20

## 2014-05-23 SURGICAL SUPPLY — 120 items
5F PIG 145DEGREE ×4 IMPLANT
ADAPTER CARDIO PERF ANTE/RETRO (ADAPTER) ×4 IMPLANT
APPLICATOR TIP COSEAL (VASCULAR PRODUCTS) IMPLANT
BAG DECANTER FOR FLEXI CONT (MISCELLANEOUS) ×4 IMPLANT
BANDAGE ELASTIC 4 VELCRO ST LF (GAUZE/BANDAGES/DRESSINGS) ×4 IMPLANT
BANDAGE ELASTIC 6 VELCRO ST LF (GAUZE/BANDAGES/DRESSINGS) ×4 IMPLANT
BASKET HEART  (ORDER IN 25'S) (MISCELLANEOUS) ×1
BASKET HEART (ORDER IN 25'S) (MISCELLANEOUS) ×1
BASKET HEART (ORDER IN 25S) (MISCELLANEOUS) ×2 IMPLANT
BLADE STERNUM SYSTEM 6 (BLADE) ×4 IMPLANT
BLADE SURG 12 STRL SS (BLADE) ×4 IMPLANT
BLADE SURG ROTATE 9660 (MISCELLANEOUS) IMPLANT
BNDG GAUZE ELAST 4 BULKY (GAUZE/BANDAGES/DRESSINGS) ×4 IMPLANT
CANISTER SUCTION 2500CC (MISCELLANEOUS) ×4 IMPLANT
CANNULA GUNDRY RCSP 15FR (MISCELLANEOUS) ×4 IMPLANT
CATH CPB KIT VANTRIGT (MISCELLANEOUS) ×4 IMPLANT
CATH HEART VENT LEFT (CATHETERS) ×2 IMPLANT
CATH ROBINSON RED A/P 18FR (CATHETERS) ×12 IMPLANT
CATH THORACIC 36FR (CATHETERS) ×4 IMPLANT
CATH THORACIC 36FR RT ANG (CATHETERS) ×12 IMPLANT
CLIP FOGARTY SPRING 6M (CLIP) ×8 IMPLANT
CLIP RETRACTION 3.0MM CORONARY (MISCELLANEOUS) ×4 IMPLANT
CLIP TI WIDE RED SMALL 24 (CLIP) ×4 IMPLANT
COVER SURGICAL LIGHT HANDLE (MISCELLANEOUS) ×4 IMPLANT
CRADLE DONUT ADULT HEAD (MISCELLANEOUS) ×4 IMPLANT
DRAIN CHANNEL 32F RND 10.7 FF (WOUND CARE) ×4 IMPLANT
DRAPE C-ARM 42X72 X-RAY (DRAPES) ×8 IMPLANT
DRAPE CARDIOVASCULAR INCISE (DRAPES) ×2
DRAPE SLUSH/WARMER DISC (DRAPES) ×4 IMPLANT
DRAPE SRG 135X102X78XABS (DRAPES) ×2 IMPLANT
DRSG AQUACEL AG ADV 3.5X14 (GAUZE/BANDAGES/DRESSINGS) ×4 IMPLANT
ELECT BLADE 4.0 EZ CLEAN MEGAD (MISCELLANEOUS) ×4
ELECT BLADE 6.5 EXT (BLADE) ×4 IMPLANT
ELECT CAUTERY BLADE 6.4 (BLADE) ×4 IMPLANT
ELECT REM PT RETURN 9FT ADLT (ELECTROSURGICAL) ×8
ELECTRODE BLDE 4.0 EZ CLN MEGD (MISCELLANEOUS) ×2 IMPLANT
ELECTRODE REM PT RTRN 9FT ADLT (ELECTROSURGICAL) ×4 IMPLANT
GAUZE SPONGE 4X4 12PLY STRL (GAUZE/BANDAGES/DRESSINGS) ×8 IMPLANT
GLOVE BIO SURGEON STRL SZ 6.5 (GLOVE) ×15 IMPLANT
GLOVE BIO SURGEON STRL SZ7.5 (GLOVE) ×12 IMPLANT
GLOVE BIO SURGEONS STRL SZ 6.5 (GLOVE) ×5
GLOVE BIOGEL PI IND STRL 7.0 (GLOVE) ×6 IMPLANT
GLOVE BIOGEL PI INDICATOR 7.0 (GLOVE) ×6
GOWN STRL REUS W/ TWL LRG LVL3 (GOWN DISPOSABLE) ×8 IMPLANT
GOWN STRL REUS W/TWL LRG LVL3 (GOWN DISPOSABLE) ×8
GRAFT GELWEAVE IMPREG 10X30CM (Prosthesis & Implant Heart) ×4 IMPLANT
HEMOSTAT POWDER SURGIFOAM 1G (HEMOSTASIS) ×12 IMPLANT
HEMOSTAT SURGICEL 2X14 (HEMOSTASIS) ×4 IMPLANT
INSERT FOGARTY SM (MISCELLANEOUS) ×12 IMPLANT
INSERT FOGARTY XLG (MISCELLANEOUS) IMPLANT
KIT BASIN OR (CUSTOM PROCEDURE TRAY) ×4 IMPLANT
KIT ROOM TURNOVER OR (KITS) ×4 IMPLANT
KIT SUCTION CATH 14FR (SUCTIONS) ×4 IMPLANT
KIT VASOVIEW W/TROCAR VH 2000 (KITS) ×4 IMPLANT
LEAD PACING MYOCARDI (MISCELLANEOUS) ×4 IMPLANT
LINE VENT (MISCELLANEOUS) ×4 IMPLANT
LOOP VESSEL MAXI BLUE (MISCELLANEOUS) ×8 IMPLANT
LOOP VESSEL MINI RED (MISCELLANEOUS) ×8 IMPLANT
MARKER GRAFT CORONARY BYPASS (MISCELLANEOUS) ×12 IMPLANT
NEEDLE 22X1 1/2 (OR ONLY) (NEEDLE) ×4 IMPLANT
NS IRRIG 1000ML POUR BTL (IV SOLUTION) ×24 IMPLANT
PACK OPEN HEART (CUSTOM PROCEDURE TRAY) ×4 IMPLANT
PAD ARMBOARD 7.5X6 YLW CONV (MISCELLANEOUS) ×8 IMPLANT
PAD ELECT DEFIB RADIOL ZOLL (MISCELLANEOUS) ×4 IMPLANT
PENCIL BUTTON HOLSTER BLD 10FT (ELECTRODE) ×4 IMPLANT
PUMP SET IMPELLA 5.0 AIC (SET/KITS/TRAYS/PACK) ×4 IMPLANT
PUNCH AORTIC ROTATE 4.0MM (MISCELLANEOUS) IMPLANT
PUNCH AORTIC ROTATE 4.5MM 8IN (MISCELLANEOUS) ×4 IMPLANT
PUNCH AORTIC ROTATE 5MM 8IN (MISCELLANEOUS) IMPLANT
SEALANT SURG COSEAL 4ML (VASCULAR PRODUCTS) ×8 IMPLANT
SEALANT SURG COSEAL 8ML (VASCULAR PRODUCTS) ×4 IMPLANT
SET CARDIOPLEGIA MPS 5001102 (MISCELLANEOUS) ×4 IMPLANT
SPONGE GAUZE 4X4 12PLY STER LF (GAUZE/BANDAGES/DRESSINGS) ×8 IMPLANT
SPONGE LAP 18X18 X RAY DECT (DISPOSABLE) ×8 IMPLANT
STAPLER VISISTAT 35W (STAPLE) ×4 IMPLANT
SURGIFLO W/THROMBIN 8M KIT (HEMOSTASIS) ×4 IMPLANT
SUT BONE WAX W31G (SUTURE) ×4 IMPLANT
SUT ETHILON 3 0 PS 1 (SUTURE) ×8 IMPLANT
SUT MNCRL AB 4-0 PS2 18 (SUTURE) IMPLANT
SUT PROLENE 3 0 RB 1 (SUTURE) ×4 IMPLANT
SUT PROLENE 3 0 SH DA (SUTURE) IMPLANT
SUT PROLENE 3 0 SH1 36 (SUTURE) IMPLANT
SUT PROLENE 4 0 RB 1 (SUTURE) ×2
SUT PROLENE 4 0 SH DA (SUTURE) ×20 IMPLANT
SUT PROLENE 4-0 RB1 .5 CRCL 36 (SUTURE) ×2 IMPLANT
SUT PROLENE 5 0 C 1 36 (SUTURE) ×12 IMPLANT
SUT PROLENE 6 0 C 1 30 (SUTURE) ×4 IMPLANT
SUT PROLENE 6 0 CC (SUTURE) ×12 IMPLANT
SUT PROLENE 7 0 BV 1 (SUTURE) ×8 IMPLANT
SUT PROLENE 8 0 BV175 6 (SUTURE) ×8 IMPLANT
SUT PROLENE BLUE 7 0 (SUTURE) ×4 IMPLANT
SUT PROLENE POLY MONO (SUTURE) ×8 IMPLANT
SUT SILK  1 MH (SUTURE) ×4
SUT SILK 1 MH (SUTURE) ×4 IMPLANT
SUT SILK 2 0 SH CR/8 (SUTURE) ×4 IMPLANT
SUT SILK 3 0 SH CR/8 (SUTURE) IMPLANT
SUT STEEL 6MS V (SUTURE) ×8 IMPLANT
SUT STEEL SZ 6 DBL 3X14 BALL (SUTURE) ×4 IMPLANT
SUT VIC AB 1 CTX 36 (SUTURE) ×6
SUT VIC AB 1 CTX36XBRD ANBCTR (SUTURE) ×6 IMPLANT
SUT VIC AB 2-0 CT1 27 (SUTURE)
SUT VIC AB 2-0 CT1 36 (SUTURE) ×4 IMPLANT
SUT VIC AB 2-0 CT1 TAPERPNT 27 (SUTURE) IMPLANT
SUT VIC AB 2-0 CTX 27 (SUTURE) ×4 IMPLANT
SUT VIC AB 3-0 X1 27 (SUTURE) ×4 IMPLANT
SUTURE E-PAK OPEN HEART (SUTURE) ×4 IMPLANT
SYSTEM SAHARA CHEST DRAIN ATS (WOUND CARE) ×4 IMPLANT
TAPE CLOTH SURG 4X10 WHT LF (GAUZE/BANDAGES/DRESSINGS) ×4 IMPLANT
TAPE PAPER 2X10 WHT MICROPORE (GAUZE/BANDAGES/DRESSINGS) ×4 IMPLANT
TOWEL OR 17X24 6PK STRL BLUE (TOWEL DISPOSABLE) ×8 IMPLANT
TOWEL OR 17X26 10 PK STRL BLUE (TOWEL DISPOSABLE) ×8 IMPLANT
TRAY FOLEY IC TEMP SENS 16FR (CATHETERS) ×4 IMPLANT
TUBE CONNECTING 12'X1/4 (SUCTIONS) ×1
TUBE CONNECTING 12X1/4 (SUCTIONS) ×3 IMPLANT
TUBING INSUFFLATION (TUBING) ×4 IMPLANT
UNDERPAD 30X30 INCONTINENT (UNDERPADS AND DIAPERS) ×4 IMPLANT
VENT LEFT HEART 12002 (CATHETERS) ×4
WATER STERILE IRR 1000ML POUR (IV SOLUTION) ×8 IMPLANT
WIRE ROSEN-J .035X260CM (WIRE) ×4 IMPLANT
YANKAUER SUCT BULB TIP NO VENT (SUCTIONS) ×4 IMPLANT

## 2014-05-23 NOTE — Progress Notes (Signed)
Patient's sodium level noted at 121, drop from 129 earlier today. Dr. Posey Pronto notified and orders for CVP and f/u BMet placed.  CVP was 12, patient has no complaints.

## 2014-05-23 NOTE — Anesthesia Procedure Notes (Signed)
Procedure Name: Intubation Date/Time: 05/27/2014 7:58 AM Performed by: Clearnce Sorrel Pre-anesthesia Checklist: Patient identified, Emergency Drugs available, Suction available and Patient being monitored Patient Re-evaluated:Patient Re-evaluated prior to inductionOxygen Delivery Method: Circle system utilized Preoxygenation: Pre-oxygenation with 100% oxygen Intubation Type: IV induction Ventilation: Mask ventilation without difficulty Laryngoscope Size: Glidescope (large glidescope elective) Grade View: Grade II Tube type: Subglottic suction tube Tube size: 8.0 mm Number of attempts: 1 Airway Equipment and Method: Stylet and Video-laryngoscopy Placement Confirmation: ETT inserted through vocal cords under direct vision,  positive ETCO2 and breath sounds checked- equal and bilateral Secured at: 22 cm Tube secured with: Tape Dental Injury: Teeth and Oropharynx as per pre-operative assessment

## 2014-05-23 NOTE — Transfer of Care (Signed)
Immediate Anesthesia Transfer of Care Note  Patient: [name]  Procedure(s) Performed: Procedure(s): CORONARY ARTERY BYPASS GRAFTING (CABG) x  four, using left internal mammary artery and right leg greater saphenous vein harvested endoscopically (N/A) PLACEMENT OF IMPELLA 5.0 LEFT VENTRICULAR ASSIST DEVICE (N/A) TRANSESOPHAGEAL ECHOCARDIOGRAM (TEE) (N/A)  Patient Location: SICU  Anesthesia Type:General  Level of Consciousness: Patient remains intubated per anesthesia plan  Airway & Oxygen Therapy: Patient remains intubated per anesthesia plan  Post-op Assessment: Report given to RN and Post -op Vital signs reviewed and stable  Post vital signs: Reviewed and stable  Last Vitals:  Filed Vitals:   06/04/2014 0700  BP: 147/55  Pulse: 81  Temp:   Resp: 18    Complications: No apparent anesthesia complications

## 2014-05-23 NOTE — Progress Notes (Signed)
Echocardiogram Echocardiogram Transesophageal has been performed.  Symir, Mah 06/07/2014, 9:22 AM

## 2014-05-23 NOTE — Progress Notes (Signed)
The patient was examined and preop studies reviewed. There has been no change from the prior exam and the patient is ready for surgery.   Plan CABG on J Debold

## 2014-05-23 NOTE — Progress Notes (Signed)
Magnesium level at 1.8, patient having increased runs of NSVT 3-6 beats. Dr. Posey Pronto notified, mag 2g ordered.

## 2014-05-23 NOTE — Progress Notes (Signed)
Advanced Heart Failure Rounding Note   Subjective:    Just brought to 2S after CABG and Impella 5.0 placement.   Stable. Impella flow 4.5L. Cardio output > 6L making urine. MAP stable  Objective:    Vital Signs:   Temp:  [98 F (36.7 C)-98.4 F (36.9 C)] 98.4 F (36.9 C) (05/12 0400) Pulse Rate:  [75-188] 81 (05/12 0700) Resp:  [15-24] 18 (05/12 0700) BP: (102-155)/(45-66) 147/55 mmHg (05/12 0700) SpO2:  [90 %-99 %] 98 % (05/12 0700) FiO2 (%):  [50 %-80 %] 80 % (05/12 1721) Weight:  [88.3 kg (194 lb 10.7 oz)] 88.3 kg (194 lb 10.7 oz) (05/12 0500) Last BM Date: 05/22/14  Weight change: Filed Weights   05/21/14 0500 05/22/14 0445 05/24/2014 0500  Weight: 87.7 kg (193 lb 5.5 oz) 87.3 kg (192 lb 7.4 oz) 88.3 kg (194 lb 10.7 oz)    Intake/Output:   Intake/Output Summary (Last 24 hours) at 05/22/2014 1726 Last data filed at 06/04/2014 1700  Gross per 24 hour  Intake 7821.3 ml  Output   5130 ml  Net 2691.3 ml     Physical Exam: General: Intubated sedated HEENT: normal Neck: supple. RIJ swan. No lymphadenopathy or thryomegaly appreciated. Cor: PMI nondisplaced. Sternal dressing Regular rhythm, controlled rate. impella hum Lungs: clear Abdomen: soft, nontender, + distended. No hepatosplenomegaly. No bruits or masses. hypoactive bowel sounds. Extremities: no cyanosis, clubbing, rash, + edema.  Neuro: intubated sedated  Telemetry: NSR with frequent polymorphic PVCs  Labs: Basic Metabolic Panel:  Recent Labs Lab 06/07/2014 2020  05/18/14 0400  05/19/14 0558  05/20/14 2156 05/21/14 0345 05/22/14 0045 05/22/14 0427 05/22/14 1907 05/14/2014 0130  05/16/2014 1204 06/10/2014 1221 05/25/2014 1318 05/14/2014 1352 05/13/2014 1518 06/04/2014 1714  NA 139  < > 130*  < > 134*  < > 128* 126* 132* 129* 121* 123*  < > 125* 127* 124* 124* 127* 128*  K 3.3*  < > 3.8  < > 3.9  < > 4.4 4.3 4.3 4.6 4.5 4.5  < > 4.0 3.9 4.2 4.3 4.1 3.9  CL 106  < > 100*  < > 99*  < > 91* 94* 97* 97* 88* 91*  < >  92* 91* 89* 90* 92*  --   CO2 23  < > 22  < > 26  < > 25 24 25 24 23 24   --   --   --   --   --   --   --   GLUCOSE 233*  < > 145*  < > 80  < > 313* 366* 157* 145* 299* 150*  < > 142* 119* 139* 135* 145* 127*  BUN 32*  < > 33*  < > 25*  < > 19 18 16 15 17 16   < > 15 13 15 13 15   --   CREATININE 1.29*  < > 1.50*  < > 1.38*  < > 1.32* 1.28* 1.07 1.11 1.20 1.14  < > 0.90 0.80 1.00 0.90 1.00  --   CALCIUM 9.0  < > 8.1*  < > 8.4*  < > 8.0* 7.8* 8.7* 8.6* 8.1* 8.3*  --   --   --   --   --   --   --   MG 3.8*  < > 1.7  --  1.7  --  1.6*  --  2.0  --   --  1.8  --   --   --   --   --   --   --  PHOS 3.7  --   --   --   --   --   --   --   --   --   --   --   --   --   --   --   --   --   --   < > = values in this interval not displayed.  Liver Function Tests:  Recent Labs Lab 05/17/14 0440 05/22/14 1907  AST 20 25  ALT 26 23  ALKPHOS 91 105  BILITOT 1.0 0.8  PROT 5.8* 6.0*  ALBUMIN 3.2* 2.7*   CBC:  Recent Labs Lab 05/19/14 0558 05/20/14 0425 05/21/14 0345 05/22/14 0427 05/19/2014 0430  05/27/2014 1318 05/30/2014 1348 05/25/2014 1352 06/10/2014 1518 05/24/2014 1714  WBC 8.9 9.0 9.8 11.6* 11.5*  --   --   --   --   --   --   HGB 11.7* 12.1* 11.2* 12.8* 10.9*  < > 7.8* 7.4* 8.5* 12.2* 10.2*  HCT 36.6* 38.2* 35.2* 39.4 33.4*  < > 23.0* 23.5* 25.0* 36.0* 30.0*  MCV 74.2* 74.6* 74.7* 75.2* 74.2*  --   --   --   --   --   --   PLT 100* 79* 71* 93* 88*  --   --  60*  --   --   --   < > = values in this interval not displayed.  Cardiac Enzymes: No results for input(s): CKTOTAL, CKMB, CKMBINDEX, TROPONINI in the last 168 hours.  BNP: BNP (last 3 results)  Recent Labs  06/05/2014 1630  BNP 885.0*    ProBNP (last 3 results) No results for input(s): PROBNP in the last 8760 hours.    Other results:  Imaging: Dg Chest Port 1 View  05/19/2014   CLINICAL DATA:  Coronary artery disease.  Status post CABG.  EXAM: PORTABLE CHEST - 1 VIEW  COMPARISON:  Chest x-ray dated 05/21/2014  FINDINGS:  Endotracheal tube, Swan-Ganz catheter, NG tube, chest tubes, and Impella left ventricular assist device are in place.  There is slight bilateral pulmonary edema. Pulmonary vascularity is normal. No pneumothorax.  IMPRESSION: Tubes and lines appear in good position. No pneumothorax. Slight pulmonary edema.   Electronically Signed   By: Lorriane Shire M.D.   On: 06/11/2014 16:55   Dg C-arm 1-60 Min-no Report  05/17/2014   CLINICAL DATA: Impella device insertion   C-ARM 1-60 MINUTES  Fluoroscopy was utilized by the requesting physician.  No radiographic  interpretation.      Medications:     Scheduled Medications: . [START ON 05/24/2014] acetaminophen  1,000 mg Oral 4 times per day   Or  . [START ON 05/24/2014] acetaminophen (TYLENOL) oral liquid 160 mg/5 mL  1,000 mg Per Tube 4 times per day  . acetaminophen (TYLENOL) oral liquid 160 mg/5 mL  650 mg Per Tube Once   Or  . acetaminophen  650 mg Rectal Once  . [START ON 05/24/2014] aspirin EC  325 mg Oral Daily   Or  . [START ON 05/24/2014] aspirin  324 mg Per Tube Daily  . [START ON 05/24/2014] bisacodyl  10 mg Oral Daily   Or  . [START ON 05/24/2014] bisacodyl  10 mg Rectal Daily  . [START ON 05/24/2014] docusate sodium  200 mg Oral Daily  . famotidine (PEPCID) IV  20 mg Intravenous Q12H  . insulin regular  0-10 Units Intravenous TID WC  . [START ON 05/24/2014] levofloxacin (LEVAQUIN) IV  750 mg Intravenous Q24H  .  magnesium sulfate  4 g Intravenous Once  . metoprolol tartrate  12.5 mg Oral BID   Or  . metoprolol tartrate  12.5 mg Per Tube BID  . [START ON 05/25/2014] pantoprazole  40 mg Oral Daily  . potassium chloride  10 mEq Intravenous Q1 Hr x 3  . [START ON 05/24/2014] sodium chloride  3 mL Intravenous Q12H  . vancomycin  1,000 mg Intravenous Once    Infusions: . sodium chloride    . [START ON 05/24/2014] sodium chloride    . sodium chloride    . amiodarone 30 mg/hr (05/18/2014 1436)  . amiodarone    . dexmedetomidine    . dextrose 5  % Impella 5.0 Purge solution    . DOPamine    . insulin (NOVOLIN-R) infusion    . lactated ringers    . lactated ringers    . milrinone    . nitroGLYCERIN    . norepinephrine (LEVOPHED) Adult infusion    . phenylephrine (NEO-SYNEPHRINE) Adult infusion      PRN Medications: sodium chloride, albumin human, lactated ringers, metoprolol, midazolam, morphine injection, morphine injection, ondansetron (ZOFRAN) IV, oxyCODONE, [START ON 05/24/2014] sodium chloride, traMADol   Assessment:   1. Cardiogenic shock 2. Ventricular fibrillation arrest 5/6 in setting of milrinone use 3. 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 3v CAD/NSTEMI: Occluded RCA, subtotally occluded prox LAD, 90% LCx 5. DM2 6. Atrial fibrillation, newly diagnosed on admission -> NSR due to defib 5/10 7. Microcytic anemia/thrombocytopenia  8. Acute kidney injury, improving 9. 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 Thrombocytopenia 11. Acute delirium  Plan/Discussion:    S/p  CABG with Impella 5.0 support 06/05/2014  Doing well immediate post-op. Continue post-op care. Hopefully can extubate in amio. Watch for recurrent VT. Continue amiodarone.   Need to keep Foley in for now due to need for urtehral dilations. On empiric abx.   Appreciate Dr. Lucianne Lei Trigt's care.  Mylei Brackeen,MD 5:26 PM

## 2014-05-23 NOTE — Brief Op Note (Signed)
05/17/2014 - 05/13/2014      [address]       [place] [address]             [phone]     05/14/2014 - 05/13/2014  1:59 PM  PATIENT:  Jeffrey [name]  74 y.o. male  PRE-OPERATIVE DIAGNOSIS:  CAD ICM  POST-OPERATIVE DIAGNOSIS:  CAD ICM  PROCEDURE:  Procedure(s): CORONARY ARTERY BYPASS GRAFTING (CABG) x 4 using left internal mammary artery and right leg greater saphenous vein harvested endoscopically(EV[facility]).  (LIMA-LAD; SVG-OM; SVG-PD; SVG-DIAG) PLACEMENT OF IMPELLA 5.0 LEFT VENTRICULAR ASSIST DEVICE TRANSESOPHAGEAL ECHOCARDIOGRAM (TEE)  SURGEON:  Surgeon(s): Ivin Poot, MD  PHYSICIAN ASSISTANT: WAYNE GOLD PA-C  ANESTHESIA:   general  PATIENT CONDITION:  ICU - intubated and hemodynamically stable.  PRE-OPERATIVE WEIGHT: 88kg  EBL: SEE ANESTH/PERFUSION RECORDS  COMPLICATIONS: NO KNOWN

## 2014-05-23 NOTE — OR Nursing (Signed)
15:00-- off pump call to SICU charge nurse

## 2014-05-23 NOTE — Progress Notes (Signed)
CRITICAL VALUE ALERT  Critical value received:  hgb 6.8   Date of notification:  05/16/2014  Time of notification:  2247   Critical value read back:Yes.    Nurse who received alert:  Paulla Fore  MD notified (1st page):  Prescott Gum  MD previously notified of i-stat result, transfusion orders received

## 2014-05-23 NOTE — Progress Notes (Signed)
CT surgery p.m. Rounds  Status post CABG 4 with temporary percutaneous LVAD-Impella 5.0 via right subclavian artery. Patient hemodynamically stable with LVAD flows greater than 4 L Chest x-ray is wet-making good urine and will wean FiO2 this p.m. as tolerated  Plan-remove intra-aortic balloon pump in a.m. then attempt ventilator wean later in the day  Will start heparin protocol for LVAD 4 hours after removal of balloon pump

## 2014-05-23 NOTE — Progress Notes (Signed)
RT note- fiow increased to 80%  post 1st ABG from OR.

## 2014-05-24 ENCOUNTER — Encounter (HOSPITAL_COMMUNITY): Payer: Self-pay | Admitting: Cardiothoracic Surgery

## 2014-05-24 ENCOUNTER — Inpatient Hospital Stay (HOSPITAL_COMMUNITY): Payer: Commercial Managed Care - HMO

## 2014-05-24 LAB — PREPARE CRYOPRECIPITATE
UNIT DIVISION: 0
Unit division: 0
Unit division: 0
Unit division: 0

## 2014-05-24 LAB — PREPARE FRESH FROZEN PLASMA
Unit division: 0
Unit division: 0
Unit division: 0

## 2014-05-24 LAB — POCT I-STAT, CHEM 8
BUN: 17 mg/dL (ref 6–20)
CALCIUM ION: 1.2 mmol/L (ref 1.13–1.30)
CHLORIDE: 94 mmol/L — AB (ref 101–111)
Creatinine, Ser: 1.2 mg/dL (ref 0.61–1.24)
Glucose, Bld: 115 mg/dL — ABNORMAL HIGH (ref 65–99)
HCT: 29 % — ABNORMAL LOW (ref 39.0–52.0)
HEMOGLOBIN: 9.9 g/dL — AB (ref 13.0–17.0)
POTASSIUM: 4.5 mmol/L (ref 3.5–5.1)
SODIUM: 123 mmol/L — AB (ref 135–145)
TCO2: 19 mmol/L (ref 0–100)

## 2014-05-24 LAB — PROTIME-INR
INR: 1.49 (ref 0.00–1.49)
Prothrombin Time: 18.1 seconds — ABNORMAL HIGH (ref 11.6–15.2)

## 2014-05-24 LAB — GLUCOSE, CAPILLARY
GLUCOSE-CAPILLARY: 139 mg/dL — AB (ref 65–99)
GLUCOSE-CAPILLARY: 110 mg/dL — AB (ref 65–99)
GLUCOSE-CAPILLARY: 121 mg/dL — AB (ref 65–99)
GLUCOSE-CAPILLARY: 114 mg/dL — AB (ref 65–99)
GLUCOSE-CAPILLARY: 106 mg/dL — AB (ref 65–99)
GLUCOSE-CAPILLARY: 116 mg/dL — AB (ref 65–99)
GLUCOSE-CAPILLARY: 135 mg/dL — AB (ref 65–99)
GLUCOSE-CAPILLARY: 141 mg/dL — AB (ref 65–99)
GLUCOSE-CAPILLARY: 126 mg/dL — AB (ref 65–99)
GLUCOSE-CAPILLARY: 110 mg/dL — AB (ref 65–99)
GLUCOSE-CAPILLARY: 111 mg/dL — AB (ref 65–99)
Glucose-Capillary: 131 mg/dL — ABNORMAL HIGH (ref 65–99)
Glucose-Capillary: 139 mg/dL — ABNORMAL HIGH (ref 65–99)
Glucose-Capillary: 109 mg/dL — ABNORMAL HIGH (ref 65–99)
Glucose-Capillary: 110 mg/dL — ABNORMAL HIGH (ref 65–99)
Glucose-Capillary: 119 mg/dL — ABNORMAL HIGH (ref 65–99)
Glucose-Capillary: 101 mg/dL — ABNORMAL HIGH (ref 65–99)
Glucose-Capillary: 106 mg/dL — ABNORMAL HIGH (ref 65–99)
Glucose-Capillary: 112 mg/dL — ABNORMAL HIGH (ref 65–99)
Glucose-Capillary: 108 mg/dL — ABNORMAL HIGH (ref 65–99)
Glucose-Capillary: 109 mg/dL — ABNORMAL HIGH (ref 65–99)
Glucose-Capillary: 99 mg/dL (ref 65–99)

## 2014-05-24 LAB — BASIC METABOLIC PANEL
ANION GAP: 6 (ref 5–15)
BUN: 15 mg/dL (ref 6–20)
CALCIUM: 7.5 mg/dL — AB (ref 8.9–10.3)
CHLORIDE: 95 mmol/L — AB (ref 101–111)
CO2: 23 mmol/L (ref 22–32)
CREATININE: 1.1 mg/dL (ref 0.61–1.24)
GFR calc Af Amer: >60 mL/min (ref >60)
GFR calc non Af Amer: >60 mL/min (ref >60)
GLUCOSE: 119 mg/dL — AB (ref 65–99)
Potassium: 4.7 mmol/L (ref 3.5–5.1)
Sodium: 124 mmol/L — ABNORMAL LOW (ref 135–145)

## 2014-05-24 LAB — BLOOD GAS, ARTERIAL
Acid-base deficit: 0.1 mmol/L (ref 0.0–2.0)
Bicarbonate: 23.5 mEq/L (ref 20.0–24.0)
FIO2: 0.5 %
MECHVT: 700 mL
O2 Saturation: 98.9 %
PEEP: 5 cmH2O
Patient temperature: 98.6
RATE: 12 resp/min
TCO2: 24.6 mmol/L (ref 0–100)
pCO2 arterial: 34.6 mmHg — ABNORMAL LOW (ref 35.0–45.0)
pH, Arterial: 7.447 (ref 7.350–7.450)
pO2, Arterial: 129 mmHg — ABNORMAL HIGH (ref 80.0–100.0)

## 2014-05-24 LAB — POCT I-STAT 3, ART BLOOD GAS (G3+)
Acid-base deficit: 2 mmol/L (ref 0.0–2.0)
Bicarbonate: 21.8 mEq/L (ref 20.0–24.0)
O2 Saturation: 97 %
PH ART: 7.42 (ref 7.350–7.450)
PO2 ART: 85 mmHg (ref 80.0–100.0)
TCO2: 23 mmol/L (ref 0–100)
pCO2 arterial: 33.6 mmHg — ABNORMAL LOW (ref 35.0–45.0)

## 2014-05-24 LAB — PREPARE PLATELET PHERESIS
UNIT DIVISION: 0
Unit division: 0

## 2014-05-24 LAB — POCT ACTIVATED CLOTTING TIME: ACTIVATED CLOTTING TIME: 122 s

## 2014-05-24 LAB — CBC
HEMATOCRIT: 22.1 % — AB (ref 39.0–52.0)
HEMOGLOBIN: 7.4 g/dL — AB (ref 13.0–17.0)
MCH: 25.4 pg — AB (ref 26.0–34.0)
MCHC: 33.5 g/dL (ref 30.0–36.0)
MCV: 75.9 fL — AB (ref 78.0–100.0)
Platelets: 90 10*3/uL — ABNORMAL LOW (ref 150–400)
RBC: 2.91 MIL/uL — AB (ref 4.22–5.81)
RDW: 16.4 % — AB (ref 11.5–15.5)
WBC: 11.9 10*3/uL — AB (ref 4.0–10.5)

## 2014-05-24 LAB — CREATININE, SERUM
CREATININE: 1.26 mg/dL — AB (ref 0.61–1.24)
GFR calc Af Amer: >60 mL/min (ref >60)
GFR calc non Af Amer: 56 mL/min — ABNORMAL LOW (ref >60)

## 2014-05-24 LAB — PREPARE RBC (CROSSMATCH)

## 2014-05-24 LAB — APTT: aPTT: 43 seconds — ABNORMAL HIGH (ref 24–37)

## 2014-05-24 LAB — MAGNESIUM
MAGNESIUM: 2.8 mg/dL — AB (ref 1.7–2.4)
Magnesium: 2.2 mg/dL (ref 1.7–2.4)

## 2014-05-24 LAB — LACTATE DEHYDROGENASE: LDH: 363 U/L — ABNORMAL HIGH (ref 98–192)

## 2014-05-24 MED ORDER — NOREPINEPHRINE BITARTRATE 1 MG/ML IV SOLN
0.0000 ug/min | INTRAVENOUS | Status: DC
  Administered 2014-05-24: 10 ug/min via INTRAVENOUS
  Administered 2014-05-25: 15 ug/min via INTRAVENOUS
  Administered 2014-05-26: 22 ug/min via INTRAVENOUS
  Administered 2014-05-26: 12 ug/min via INTRAVENOUS
  Administered 2014-05-27: 17 ug/min via INTRAVENOUS
  Administered 2014-05-28: 19 ug/min via INTRAVENOUS
  Administered 2014-05-29: 18 ug/min via INTRAVENOUS
  Administered 2014-05-29 – 2014-05-30 (×2): 40 ug/min via INTRAVENOUS
  Administered 2014-05-30: 35 ug/min via INTRAVENOUS
  Administered 2014-05-30: 34.987 ug/min via INTRAVENOUS
  Administered 2014-05-31: 45 ug/min via INTRAVENOUS
  Administered 2014-05-31: 40 ug/min via INTRAVENOUS
  Administered 2014-06-01 (×2): 42 ug/min via INTRAVENOUS
  Filled 2014-05-24 (×17): qty 16

## 2014-05-24 MED ORDER — MAGNESIUM SULFATE 2 GM/50ML IV SOLN
2.0000 g | Freq: Once | INTRAVENOUS | Status: AC
  Administered 2014-05-24: 2 g via INTRAVENOUS
  Filled 2014-05-24: qty 50

## 2014-05-24 MED ORDER — COAGULATION FACTOR VIIA RECOMB 1 MG IV SOLR
45.0000 ug/kg | Freq: Once | INTRAVENOUS | Status: AC
  Administered 2014-05-24: 5000 ug via INTRAVENOUS
  Filled 2014-05-24: qty 5

## 2014-05-24 MED ORDER — MIDAZOLAM HCL 5 MG/ML IJ SOLN
2.0000 mg/h | INTRAMUSCULAR | Status: DC
  Administered 2014-05-24: 1 mg/h via INTRAVENOUS
  Administered 2014-05-25 – 2014-05-26 (×2): 2 mg/h via INTRAVENOUS
  Administered 2014-05-26 – 2014-05-27 (×3): 4 mg/h via INTRAVENOUS
  Administered 2014-05-29: 3 mg/h via INTRAVENOUS
  Administered 2014-05-29 – 2014-05-31 (×2): 2 mg/h via INTRAVENOUS
  Administered 2014-06-01: 3 mg/h via INTRAVENOUS
  Filled 2014-05-24 (×11): qty 10

## 2014-05-24 MED ORDER — FUROSEMIDE 10 MG/ML IJ SOLN
8.0000 mg/h | INTRAVENOUS | Status: DC
  Administered 2014-05-24: 5 mg/h via INTRAVENOUS
  Administered 2014-05-25 – 2014-05-26 (×2): 8 mg/h via INTRAVENOUS
  Filled 2014-05-24 (×4): qty 25

## 2014-05-24 MED ORDER — DEXMEDETOMIDINE HCL IN NACL 400 MCG/100ML IV SOLN
0.0000 ug/kg/h | INTRAVENOUS | Status: DC
  Administered 2014-05-24 – 2014-05-26 (×7): 0.7 ug/kg/h via INTRAVENOUS
  Administered 2014-05-26 (×2): 1 ug/kg/h via INTRAVENOUS
  Administered 2014-05-26: 0.7 ug/kg/h via INTRAVENOUS
  Administered 2014-05-27 – 2014-05-29 (×14): 1 ug/kg/h via INTRAVENOUS
  Administered 2014-05-30: 0.8 ug/kg/h via INTRAVENOUS
  Administered 2014-05-30: 0.7 ug/kg/h via INTRAVENOUS
  Administered 2014-05-30: 0.846 ug/kg/h via INTRAVENOUS
  Administered 2014-05-31 – 2014-06-01 (×6): 0.8 ug/kg/h via INTRAVENOUS
  Filled 2014-05-24 (×38): qty 100

## 2014-05-24 MED ORDER — LEVALBUTEROL HCL 1.25 MG/0.5ML IN NEBU
1.2500 mg | INHALATION_SOLUTION | Freq: Four times a day (QID) | RESPIRATORY_TRACT | Status: DC | PRN
  Filled 2014-05-24: qty 0.5

## 2014-05-24 MED FILL — Lidocaine HCl IV Inj 20 MG/ML: INTRAVENOUS | Qty: 5 | Status: AC

## 2014-05-24 MED FILL — Calcium Chloride Inj 10%: INTRAVENOUS | Qty: 10 | Status: AC

## 2014-05-24 MED FILL — Dexmedetomidine HCl in NaCl 0.9% IV Soln 400 MCG/100ML: INTRAVENOUS | Qty: 100 | Status: AC

## 2014-05-24 MED FILL — Magnesium Sulfate Inj 50%: INTRAMUSCULAR | Qty: 10 | Status: AC

## 2014-05-24 MED FILL — Sodium Chloride IV Soln 0.9%: INTRAVENOUS | Qty: 1000 | Status: AC

## 2014-05-24 MED FILL — Heparin Sodium (Porcine) Inj 1000 Unit/ML: INTRAMUSCULAR | Qty: 30 | Status: AC

## 2014-05-24 MED FILL — Sodium Chloride IV Soln 0.9%: INTRAVENOUS | Qty: 4000 | Status: AC

## 2014-05-24 MED FILL — Mannitol IV Soln 20%: INTRAVENOUS | Qty: 500 | Status: AC

## 2014-05-24 MED FILL — Heparin Sodium (Porcine) Inj 1000 Unit/ML: INTRAMUSCULAR | Qty: 20 | Status: AC

## 2014-05-24 MED FILL — Sodium Bicarbonate IV Soln 8.4%: INTRAVENOUS | Qty: 50 | Status: AC

## 2014-05-24 MED FILL — Electrolyte-R (PH 7.4) Solution: INTRAVENOUS | Qty: 4000 | Status: AC

## 2014-05-24 MED FILL — Potassium Chloride Inj 2 mEq/ML: INTRAVENOUS | Qty: 40 | Status: AC

## 2014-05-24 NOTE — Progress Notes (Signed)
Initial Nutrition Assessment  DOCUMENTATION CODES:  Obesity unspecified  INTERVENTION:  Tube feeding, Prostat   If started, recommend Vital High Protein formula -- initiate at 20 ml/hr and increase by 10 ml every 4 hours to goal rate of 40 ml/hr  Prostat liquid protein 30 ml 5 times daily via tube  Total TF regimen to provide 1460 kcals, 159 gm protein, 903 ml of free water  NUTRITION DIAGNOSIS:  Inadequate oral intake related to inability to eat as evidenced by NPO status  GOAL:  Provide needs based on ASPEN/SCCM guidelines  MONITOR:  Vent status, Labs, Weight trends, I & O's, Skin  REASON FOR ASSESSMENT:  Ventilator  ASSESSMENT: 74 y.o. Male with HTN, DM, HLD admitted with NSTEMI and new AF; s/p cardiac cath 5/6 with severe3-v CAD; had VF arrest requiring DC-CV.   Patient s/p procedure 5/12: CORONARY ARTERY BYPASS GRAFTING (CABG) x 4 PLACEMENT OF IMPELLA 5.0 LEFT VENTRICULAR ASSIST DEVICE  Patient is currently intubated on ventilator support -- OGT in place MV: 14.9 L/min Temp (24hrs), Avg:98.3 F (36.8 C), Min:95.9 F (35.5 C), Max:99.7 F (37.6 C)   Nutrition focused physical exam completed.  No muscle or subcutaneous fat depletion noticed.  Height:  Ht Readings from Last 1 Encounters:  05/15/2014 5\' 9"  (1.753 m)    Weight:  Wt Readings from Last 1 Encounters:  05/24/14 227 lb 3.2 oz (103.057 kg)    Ideal Body Weight:  73 kg  Wt Readings from Last 10 Encounters:  05/24/14 227 lb 3.2 oz (103.057 kg)  04/30/11 192 lb (87.091 kg)  08/18/10 199 lb (90.266 kg)    BMI:  Body mass index is 33.54 kg/(m^2).  Estimated Nutritional Needs:  Kcal:  4097-3532  Protein:  150-160 gm  Fluid:  per MD  Skin:  Wound (see comment) (Stage I to sacrum)  Diet Order:  Diet NPO time specified  EDUCATION NEEDS:  No education needs identified at this time   Intake/Output Summary (Last 24 hours) at 05/24/14 1504 Last data filed at 05/24/14 1400  Gross  per 24 hour  Intake 10287.79 ml  Output   5385 ml  Net 4902.79 ml    Last BM:  unknown   Arthur Holms, RD, LDN Pager #: [phone] After-Hours Pager #: [phone]

## 2014-05-24 NOTE — Progress Notes (Signed)
Patient ID: Jeffrey [name], male   DOB: 1940/02/23, 74 y.o.   MRN: [id number] SICU Evening Rounds:  He has been fairly stable today on Milrinone 0.3, dop 5, levophed 10, vasopressin .02 and Impella flow at 4.5.  CI 2.4 MAP 76  Good urine output.  Chest tube output was elevated for several hrs today and he received Novaseven and some platelets. Output now down to 10-30/hr.  Sedated on vent.  CBC    Component Value Date/Time   WBC 11.9* 05/24/2014 0346   RBC 2.91* 05/24/2014 0346   HGB 9.9* 05/24/2014 1552   HCT 29.0* 05/24/2014 1552   PLT 90* 05/24/2014 0346   MCV 75.9* 05/24/2014 0346   MCH 25.4* 05/24/2014 0346   MCHC 33.5 05/24/2014 0346   RDW 16.4* 05/24/2014 0346   LYMPHSABS 1.2 05/30/2014 1630   MONOABS 0.5 06/07/2014 1630   EOSABS 0.2 06/02/2014 1630   BASOSABS 0.1 06/03/2014 1630    BMET    Component Value Date/Time   NA 123* 05/24/2014 1552   K 4.5 05/24/2014 1552   CL 94* 05/24/2014 1552   CO2 23 05/24/2014 0346   GLUCOSE 115* 05/24/2014 1552   BUN 17 05/24/2014 1552   CREATININE 1.26* 05/24/2014 1600   CALCIUM 7.5* 05/24/2014 0346   GFRNONAA 56* 05/24/2014 1600   GFRAA >60 05/24/2014 1600    A/P: Overall stable. Wean vasopressin as tolerated.

## 2014-05-24 NOTE — Progress Notes (Signed)
At approximately 2200 patient noted to be in sustained v-tach.  Defib pads applied and shock was delivered, patient subsequently back in a sinus tachycardia.  MD paged, orders received.  Patient again went into v-tach and received one additional shock prior to the initiation of orders.  Will continue to monitor.

## 2014-05-24 NOTE — Progress Notes (Signed)
Order for sheath removal verified per post procedural orders. Procedure explained to patient and Rt femoral artery access site assessed: level 0, palpable dorsalis pedis and posterior tibial pulses. 8 [place] Sheath removed and manual pressure applied for 45 minutes. Pre, peri, & post procedural vitals: HR 94, RR Vent, O2 Sat upper 100, BP 80's, Pain 0. Distal pulses remained intact after sheath removal. Access site level 0 and dressed with 4X4 gauze and tegaderm.  Ebony Hail, RN confirmed condition of site. Post procedural instructions discussed and return demonstration from patient.

## 2014-05-24 NOTE — Anesthesia Postprocedure Evaluation (Signed)
Anesthesia Post Note  Patient: Jeffrey [name]  Procedure(s) Performed: Procedure(s) (LRB): CORONARY ARTERY BYPASS GRAFTING (CABG) x  four, using left internal mammary artery and right leg greater saphenous vein harvested endoscopically (N/A) PLACEMENT OF IMPELLA 5.0 LEFT VENTRICULAR ASSIST DEVICE (N/A) TRANSESOPHAGEAL ECHOCARDIOGRAM (TEE) (N/A)  Anesthesia type: General  Patient location: PACU  Post pain: Sedated and intubated  Post assessment: Post-op Vital signs reviewed  Last Vitals: BP 111/64 mmHg  Pulse 91  Temp(Src) 37.3 C (Core (Comment))  Resp 16  Ht 5\' 9"  (1.753 m)  Wt 227 lb 3.2 oz (103.057 kg)  BMI 33.54 kg/m2  SpO2 100%  Post vital signs: Reviewed  Level of consciousness: sedated/intubated  Complications: No apparent anesthesia complications

## 2014-05-24 NOTE — Progress Notes (Signed)
Advanced Heart Failure Rounding Note   Subjective:    Underwent CABG and Impella 5.0 placement on 5/12  Remains intubated and sedated. On multiple pressors. Flow 4.5 on Impella. PA pressures and outputs look good. On lasix gtt.   Last night shocked for VT x 2. Remains on amio and lido..    Objective:    Vital Signs:   Temp:  [95.9 F (35.5 C)-99.7 F (37.6 C)] 99.7 F (37.6 C) (05/13 1415) Pulse Rate:  [29-103] 95 (05/13 1415) Resp:  [6-29] 20 (05/13 1415) BP: (66-123)/(48-91) 84/71 mmHg (05/13 1400) SpO2:  [93 %-100 %] 97 % (05/13 1415) Arterial Line BP: (54-135)/(34-72) 62/51 mmHg (05/13 1415) FiO2 (%):  [50 %-100 %] 50 % (05/13 1108) Weight:  [103.057 kg (227 lb 3.2 oz)] 103.057 kg (227 lb 3.2 oz) (05/13 0500) Last BM Date: 05/22/14  Weight change: Filed Weights   05/22/14 0445 05/31/2014 0500 05/24/14 0500  Weight: 87.3 kg (192 lb 7.4 oz) 88.3 kg (194 lb 10.7 oz) 103.057 kg (227 lb 3.2 oz)    Intake/Output:   Intake/Output Summary (Last 24 hours) at 05/24/14 1426 Last data filed at 05/24/14 1400  Gross per 24 hour  Intake 10287.79 ml  Output   5635 ml  Net 4652.79 ml     Physical Exam: General: Intubated sedated HEENT: normal Neck: supple. RIJ swan. No lymphadenopathy or thryomegaly appreciated. Cor: R subclavian Impella sheath. Sternal dressing Regular rhythm, controlled rate. impella hum Lungs: clear Abdomen: soft, nontender, + distended. No hepatosplenomegaly. No bruits or masses. hypoactive bowel sounds. Extremities: no cyanosis, clubbing, rash, + edema.  Neuro: intubated sedated  Telemetry: NSR with frequent polymorphic PVCs long run of VT   Labs: Basic Metabolic Panel:  Recent Labs Lab 05/20/14 2156  05/22/14 0045 05/22/14 0427 05/22/14 1907 06/04/2014 0130  05/31/2014 1318 05/26/2014 1352 06/09/2014 1518 06/06/2014 1714 06/06/2014 2206 06/11/2014 2221 05/24/14 0346  NA 128*  < > 132* 129* 121* 123*  < > 124* 124* 127* 128* 127*  --  124*  K 4.4  <  > 4.3 4.6 4.5 4.5  < > 4.2 4.3 4.1 3.9 4.3  --  4.7  CL 91*  < > 97* 97* 88* 91*  < > 89* 90* 92*  --  93*  --  95*  CO2 25  < > 25 24 23 24   --   --   --   --   --   --   --  23  GLUCOSE 313*  < > 157* 145* 299* 150*  < > 139* 135* 145* 127* 147*  --  119*  BUN 19  < > 16 15 17 16   < > 15 13 15   --  16  --  15  CREATININE 1.32*  < > 1.07 1.11 1.20 1.14  < > 1.00 0.90 1.00  --  1.00  --  1.10  CALCIUM 8.0*  < > 8.7* 8.6* 8.1* 8.3*  --   --   --   --   --   --   --  7.5*  MG 1.6*  --  2.0  --   --  1.8  --   --   --   --   --   --  1.7 2.8*  < > = values in this interval not displayed.  Liver Function Tests:  Recent Labs Lab 05/22/14 1907  AST 25  ALT 23  ALKPHOS 105  BILITOT 0.8  PROT 6.0*  ALBUMIN 2.7*  CBC:  Recent Labs Lab 05/22/14 0427 06/03/2014 0430  05/22/2014 1348  05/21/2014 1714 06/05/2014 1724 05/17/2014 2206 05/16/2014 2220 05/24/14 0346  WBC 11.6* 11.5*  --   --   --   --  14.6*  --  13.9* 11.9*  HGB 12.8* 10.9*  < > 7.4*  < > 10.2* 8.4* 6.8* 6.8* 7.4*  HCT 39.4 33.4*  < > 23.5*  < > 30.0* 25.5* 20.0* 21.1* 22.1*  MCV 75.2* 74.2*  --   --   --   --  76.3*  --  75.9* 75.9*  PLT 93* 88*  --  60*  --   --  REPEATED TO VERIFY  --  110* 90*  < > = values in this interval not displayed.  Cardiac Enzymes: No results for input(s): CKTOTAL, CKMB, CKMBINDEX, TROPONINI in the last 168 hours.  BNP: BNP (last 3 results)  Recent Labs  06/11/2014 1630  BNP 885.0*    ProBNP (last 3 results) No results for input(s): PROBNP in the last 8760 hours.    Other results:  Imaging: Dg Chest Port 1 View  05/24/2014   CLINICAL DATA:  CABG and placement of left ventricular assist device yesterday.  EXAM: PORTABLE CHEST - 1 VIEW  COMPARISON:  One-view chest 06/04/2014  FINDINGS: Median sternotomy wires are noted. A left ventricular assist device is stable in position. The endotracheal tube terminates 3 cm above the carina. A Swan-Ganz catheter enters via a right IJ sheath and  terminates in the main pulmonary outflow tract. Mediastinal drain and left sided pleural drains are stable. There is no pneumothorax. Bilateral pleural effusions and edema are relatively stable. Lung volumes are slightly improved.  IMPRESSION: 1. Status post CABG and placement of left ventricular assist device. 2. Support apparatus is stable. 3. Persistent edema and bilateral pleural effusions. 4. Overall lung volumes are slightly improved.   Electronically Signed   By: San Morelle M.D.   On: 05/24/2014 07:54   Dg Chest Port 1 View  05/30/2014   CLINICAL DATA:  Coronary artery disease.  Status post CABG.  EXAM: PORTABLE CHEST - 1 VIEW  COMPARISON:  Chest x-ray dated 05/21/2014  FINDINGS: Endotracheal tube, Swan-Ganz catheter, NG tube, chest tubes, and Impella left ventricular assist device are in place.  There is slight bilateral pulmonary edema. Pulmonary vascularity is normal. No pneumothorax.  IMPRESSION: Tubes and lines appear in good position. No pneumothorax. Slight pulmonary edema.   Electronically Signed   By: Lorriane Shire M.D.   On: 05/26/2014 16:55   Dg C-arm 1-60 Min-no Report  05/29/2014   CLINICAL DATA: Impella device insertion   C-ARM 1-60 MINUTES  Fluoroscopy was utilized by the requesting physician.  No radiographic  interpretation.      Medications:     Scheduled Medications: . acetaminophen  1,000 mg Oral 4 times per day   Or  . acetaminophen (TYLENOL) oral liquid 160 mg/5 mL  1,000 mg Per Tube 4 times per day  . antiseptic oral rinse  7 mL Mouth Rinse QID  . aspirin EC  325 mg Oral Daily   Or  . aspirin  324 mg Per Tube Daily  . bisacodyl  10 mg Oral Daily   Or  . bisacodyl  10 mg Rectal Daily  . chlorhexidine  15 mL Mouth Rinse BID  . docusate sodium  200 mg Oral Daily  . insulin regular  0-10 Units Intravenous TID WC  . levalbuterol  1.25 mg Nebulization Q6H  .  lidocaine  100 mg Intravenous Once  . metoprolol tartrate  12.5 mg Oral BID   Or  . metoprolol  tartrate  12.5 mg Per Tube BID  . [START ON 05/25/2014] pantoprazole  40 mg Oral Daily  . sodium chloride  3 mL Intravenous Q12H  . vancomycin  1,000 mg Intravenous Q12H    Infusions: . sodium chloride    . sodium chloride    . sodium chloride 20 mL/hr at 05/24/14 1300  . amiodarone 30 mg/hr (06/03/2014 2200)  . amiodarone 30 mg/hr (05/24/14 1300)  . dexmedetomidine 0.7 mcg/kg/hr (05/24/14 1300)  . dextrose 5 % Impella 5.0 Purge solution    . DOPamine 5 mcg/kg/min (05/24/14 1300)  . furosemide (LASIX) infusion 5 mg/hr (05/24/14 1300)  . insulin (NOVOLIN-R) infusion 1.6 Units/hr (05/24/14 1300)  . lactated ringers Stopped (05/24/14 0400)  . lactated ringers 20 mL/hr at 05/24/14 1300  . lidocaine 1 mg/min (05/24/14 1300)  . midazolam (VERSED) infusion    . milrinone 0.3 mcg/kg/min (05/24/14 1300)  . nitroGLYCERIN 0 mcg/min (06/04/2014 1700)  . norepinephrine (LEVOPHED) Adult infusion 10 mcg/min (05/24/14 1300)  . phenylephrine (NEO-SYNEPHRINE) Adult infusion 0 mcg/min (05/17/2014 1700)  . vasopressin (PITRESSIN) infusion - *FOR SHOCK* 0.02 Units/min (05/24/14 1309)    PRN Medications: sodium chloride, albumin human, metoprolol, morphine injection, ondansetron (ZOFRAN) IV, oxyCODONE, sodium chloride, traMADol   Assessment:   1. Cardiogenic shock 2. Ventricular fibrillation arrest 5/6 in setting of milrinone use 3. 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 3v CAD/NSTEMI: Occluded RCA, subtotally occluded prox LAD, 90% LCx 5. DM2 6. Atrial fibrillation, newly diagnosed on admission -> NSR due to defib 5/10 7. Microcytic anemia/thrombocytopenia  8. Acute kidney injury, improving 9. 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 Thrombocytopenia 11. Acute delirium  Plan/Discussion:    S/p  CABG with Impella 5.0 support 06/03/2014  Hemodynamically stable but unfortunately had recurrent VT last  night. Will continue IV amio and lido. I am worried he may continue to have VT despite revascularization and may need EP to see again to discuss other strategies. Can consider ranexa.   Agree with diuresis. Wean vent and pressors as tolerated.   D/w Dr. Prescott Gum.   The patient is critically ill with multiple organ systems failure and requires high complexity decision making for assessment and support, frequent evaluation and titration of therapies, application of advanced monitoring technologies and extensive interpretation of multiple databases.   Critical Care Time devoted to patient care services described in this note is 35 Minutes.   Shanekqua Schaper,MD 2:26 PM

## 2014-05-24 NOTE — Progress Notes (Signed)
1 Day Post-Op Procedure(s) (LRB): CORONARY ARTERY BYPASS GRAFTING (CABG) x  four, using left internal mammary artery and right leg greater saphenous vein harvested endoscopically (N/A) PLACEMENT OF IMPELLA 5.0 LEFT VENTRICULAR ASSIST DEVICE (N/A) TRANSESOPHAGEAL ECHOCARDIOGRAM (TEE) (N/A) Subjective: Status post multivessel CABG with placement of temporary percutaneous LVAD via right subclavian artery for ischemic cardiomyopathy following MI  Patient's hemodynamics have been stable .Balloon pump has been weaned and removed Vasopressin dose has been reduced Norepinephrine requirement has been reduced Neuro appears to be intact  Lasix drip-100 cc per hour Chest x-ray remains to wet to wean, FiO2 has been weaned to 50% Patient had V. Tach last night requiring EC cardioversion and remains on IV amiodarone and lidocaine  Objective: Vital signs in last 24 hours: Temp:  [95.9 F (35.5 C)-99.9 F (37.7 C)] 99.5 F (37.5 C) (05/13 1645) Pulse Rate:  [29-103] 98 (05/13 1645) Cardiac Rhythm:  [-] Normal sinus rhythm (05/13 1600) Resp:  [6-29] 17 (05/13 1645) BP: (66-123)/(48-91) 98/74 mmHg (05/13 1600) SpO2:  [93 %-100 %] 100 % (05/13 1645) Arterial Line BP: (54-135)/(34-72) 106/72 mmHg (05/13 1645) FiO2 (%):  [50 %-100 %] 50 % (05/13 1600) Weight:  [227 lb 3.2 oz (103.057 kg)] 227 lb 3.2 oz (103.057 kg) (05/13 0500)  Hemodynamic parameters for last 24 hours: PAP: (23-36)/(16-27) 33/18 mmHg CVP:  [7 mmHg-16 mmHg] 14 mmHg CO:  [3.8 L/min-5.1 L/min] 4.9 L/min CI:  [1.8 L/min/m2-2.5 L/min/m2] 2.4 L/min/m2  Intake/Output from previous day: 05/12 0701 - 05/13 0700 In: 69678.9 [I.V.:7324.2; FYBOF:7510; NG/GT:90; IV Piggyback:1000] Out: 2585 [IDPOE:4235; Blood:2400; Chest Tube:540] Intake/Output this shift: Total I/O In: 2626.1 [I.V.:1448.8; Blood:638.2; Other:109.2; NG/GT:30; IV Piggyback:400] Out: 2020 [Urine:1150; Emesis/NG output:300; Chest Tube:570]  Responsive Lungs scattered  rhonchi Extremities warm Sinus rhythm Lab Results:  Recent Labs  06/03/2014 2220 05/24/14 0346 05/24/14 1552  WBC 13.9* 11.9*  --   HGB 6.8* 7.4* 9.9*  HCT 21.1* 22.1* 29.0*  PLT 110* 90*  --    BMET:  Recent Labs  05/12/2014 0130  05/24/14 0346 05/24/14 1552 05/24/14 1600  NA 123*  < > 124* 123*  --   K 4.5  < > 4.7 4.5  --   CL 91*  < > 95* 94*  --   CO2 24  --  23  --   --   GLUCOSE 150*  < > 119* 115*  --   BUN 16  < > 15 17  --   CREATININE 1.14  < > 1.10 1.20 1.26*  CALCIUM 8.3*  --  7.5*  --   --   < > = values in this interval not displayed.  PT/INR:  Recent Labs  05/24/14 0346  LABPROT 18.1*  INR 1.49   ABG    Component Value Date/Time   PHART 7.420 05/24/2014 1057   HCO3 21.8 05/24/2014 1057   TCO2 19 05/24/2014 1552   ACIDBASEDEF 2.0 05/24/2014 1057   O2SAT 97.0 05/24/2014 1057   CBG (last 3)   Recent Labs  05/24/14 1419 05/24/14 1501 05/24/14 1550  GLUCAP 110* 111* 106*    Assessment/Plan: S/P Procedure(s) (LRB): CORONARY ARTERY BYPASS GRAFTING (CABG) x  four, using left internal mammary artery and right leg greater saphenous vein harvested endoscopically (N/A) PLACEMENT OF IMPELLA 5.0 LEFT VENTRICULAR ASSIST DEVICE (N/A) TRANSESOPHAGEAL ECHOCARDIOGRAM (TEE) (N/A) We'll continue diuresis and try to wean ventilator and extubate Patient dumped several 100 cc of blood from the chest tubes after balloon pump removed-treated with platelet transfusion and low-dose  factor VII with resolution of chest tube drainage. Chest x-ray shows no evidence of reaccumulation of blood and hemoglobin is stable.  We will hold on heparin until tomorrow and start through LVAD purge catheter per protocol.   LOS: 10 days    Tharon Aquas Trigt III 05/24/2014

## 2014-05-25 ENCOUNTER — Inpatient Hospital Stay (HOSPITAL_COMMUNITY): Payer: Commercial Managed Care - HMO

## 2014-05-25 DIAGNOSIS — E871 Hypo-osmolality and hyponatremia: Secondary | ICD-10-CM

## 2014-05-25 LAB — GLUCOSE, CAPILLARY
GLUCOSE-CAPILLARY: 100 mg/dL — AB (ref 65–99)
GLUCOSE-CAPILLARY: 108 mg/dL — AB (ref 65–99)
GLUCOSE-CAPILLARY: 129 mg/dL — AB (ref 65–99)
GLUCOSE-CAPILLARY: 135 mg/dL — AB (ref 65–99)
GLUCOSE-CAPILLARY: 136 mg/dL — AB (ref 65–99)
GLUCOSE-CAPILLARY: 142 mg/dL — AB (ref 65–99)
Glucose-Capillary: 108 mg/dL — ABNORMAL HIGH (ref 65–99)
Glucose-Capillary: 109 mg/dL — ABNORMAL HIGH (ref 65–99)
Glucose-Capillary: 111 mg/dL — ABNORMAL HIGH (ref 65–99)
Glucose-Capillary: 112 mg/dL — ABNORMAL HIGH (ref 65–99)
Glucose-Capillary: 113 mg/dL — ABNORMAL HIGH (ref 65–99)
Glucose-Capillary: 120 mg/dL — ABNORMAL HIGH (ref 65–99)
Glucose-Capillary: 127 mg/dL — ABNORMAL HIGH (ref 65–99)
Glucose-Capillary: 133 mg/dL — ABNORMAL HIGH (ref 65–99)
Glucose-Capillary: 142 mg/dL — ABNORMAL HIGH (ref 65–99)
Glucose-Capillary: 144 mg/dL — ABNORMAL HIGH (ref 65–99)
Glucose-Capillary: 147 mg/dL — ABNORMAL HIGH (ref 65–99)
Glucose-Capillary: 153 mg/dL — ABNORMAL HIGH (ref 65–99)
Glucose-Capillary: 97 mg/dL (ref 65–99)
Glucose-Capillary: 98 mg/dL (ref 65–99)
Glucose-Capillary: 98 mg/dL (ref 65–99)

## 2014-05-25 LAB — CBC
HCT: 25.5 % — ABNORMAL LOW (ref 39.0–52.0)
Hemoglobin: 8.8 g/dL — ABNORMAL LOW (ref 13.0–17.0)
MCH: 26.2 pg (ref 26.0–34.0)
MCHC: 34.5 g/dL (ref 30.0–36.0)
MCV: 75.9 fL — ABNORMAL LOW (ref 78.0–100.0)
Platelets: 101 10*3/uL — ABNORMAL LOW (ref 150–400)
RBC: 3.36 MIL/uL — ABNORMAL LOW (ref 4.22–5.81)
RDW: 16.4 % — ABNORMAL HIGH (ref 11.5–15.5)
WBC: 19.7 10*3/uL — ABNORMAL HIGH (ref 4.0–10.5)

## 2014-05-25 LAB — TYPE AND SCREEN
ABO/RH(D): A POS
Antibody Screen: NEGATIVE
Unit division: 0
Unit division: 0
Unit division: 0
Unit division: 0
Unit division: 0
Unit division: 0

## 2014-05-25 LAB — COMPREHENSIVE METABOLIC PANEL
ALT: 29 U/L (ref 17–63)
AST: 37 U/L (ref 15–41)
Albumin: 2.2 g/dL — ABNORMAL LOW (ref 3.5–5.0)
Alkaline Phosphatase: 60 U/L (ref 38–126)
Anion gap: 7 (ref 5–15)
BUN: 17 mg/dL (ref 6–20)
CO2: 23 mmol/L (ref 22–32)
Calcium: 7.6 mg/dL — ABNORMAL LOW (ref 8.9–10.3)
Chloride: 91 mmol/L — ABNORMAL LOW (ref 101–111)
Creatinine, Ser: 1.21 mg/dL (ref 0.61–1.24)
GFR calc Af Amer: >60 mL/min (ref >60)
GFR calc non Af Amer: 58 mL/min — ABNORMAL LOW (ref >60)
Glucose, Bld: 115 mg/dL — ABNORMAL HIGH (ref 65–99)
Potassium: 3.6 mmol/L (ref 3.5–5.1)
Sodium: 121 mmol/L — ABNORMAL LOW (ref 135–145)
Total Bilirubin: 1.4 mg/dL — ABNORMAL HIGH (ref 0.3–1.2)
Total Protein: 4.3 g/dL — ABNORMAL LOW (ref 6.5–8.1)

## 2014-05-25 LAB — BLOOD GAS, ARTERIAL
Acid-base deficit: 0 mmol/L (ref 0.0–2.0)
Bicarbonate: 23.6 mEq/L (ref 20.0–24.0)
Drawn by: 36496
FIO2: 0.4 %
MECHVT: 700 mL
O2 Saturation: 98.7 %
PEEP: 5 cmH2O
Patient temperature: 98.6
RATE: 12 resp/min
TCO2: 24.7 mmol/L (ref 0–100)
pCO2 arterial: 35.4 mmHg (ref 35.0–45.0)
pH, Arterial: 7.439 (ref 7.350–7.450)
pO2, Arterial: 116 mmHg — ABNORMAL HIGH (ref 80.0–100.0)

## 2014-05-25 LAB — POCT ACTIVATED CLOTTING TIME
ACTIVATED CLOTTING TIME: 128 s
ACTIVATED CLOTTING TIME: 98 s
ACTIVATED CLOTTING TIME: 128 s
Activated Clotting Time: 122 seconds
Activated Clotting Time: 128 seconds
Activated Clotting Time: 128 seconds
Activated Clotting Time: 128 seconds
Activated Clotting Time: 128 seconds

## 2014-05-25 LAB — POCT I-STAT, CHEM 8
BUN: 19 mg/dL (ref 6–20)
BUN: 17 mg/dL (ref 6–20)
Calcium, Ion: 1.14 mmol/L (ref 1.13–1.30)
Calcium, Ion: 1.15 mmol/L (ref 1.13–1.30)
Chloride: 89 mmol/L — ABNORMAL LOW (ref 101–111)
Chloride: 90 mmol/L — ABNORMAL LOW (ref 101–111)
Creatinine, Ser: 1.3 mg/dL — ABNORMAL HIGH (ref 0.61–1.24)
Creatinine, Ser: 1.4 mg/dL — ABNORMAL HIGH (ref 0.61–1.24)
Glucose, Bld: 129 mg/dL — ABNORMAL HIGH (ref 65–99)
Glucose, Bld: 109 mg/dL — ABNORMAL HIGH (ref 65–99)
HCT: 30 % — ABNORMAL LOW (ref 39.0–52.0)
HEMATOCRIT: 26 % — AB (ref 39.0–52.0)
HEMOGLOBIN: 10.2 g/dL — AB (ref 13.0–17.0)
HEMOGLOBIN: 8.8 g/dL — AB (ref 13.0–17.0)
POTASSIUM: 4.1 mmol/L (ref 3.5–5.1)
Potassium: 3.5 mmol/L (ref 3.5–5.1)
SODIUM: 126 mmol/L — AB (ref 135–145)
Sodium: 123 mmol/L — ABNORMAL LOW (ref 135–145)
TCO2: 22 mmol/L (ref 0–100)
TCO2: 22 mmol/L (ref 0–100)

## 2014-05-25 LAB — APTT: aPTT: 32 seconds (ref 24–37)

## 2014-05-25 LAB — PREPARE PLATELET PHERESIS: Unit division: 0

## 2014-05-25 LAB — LACTATE DEHYDROGENASE: LDH: 430 U/L — ABNORMAL HIGH (ref 98–192)

## 2014-05-25 LAB — MAGNESIUM: Magnesium: 1.8 mg/dL (ref 1.7–2.4)

## 2014-05-25 MED ORDER — TOLVAPTAN 15 MG PO TABS
15.0000 mg | ORAL_TABLET | Freq: Once | ORAL | Status: AC
  Administered 2014-05-25: 15 mg via ORAL
  Filled 2014-05-25: qty 1

## 2014-05-25 MED ORDER — MAGNESIUM SULFATE 2 GM/50ML IV SOLN
2.0000 g | Freq: Once | INTRAVENOUS | Status: AC
  Administered 2014-05-25: 2 g via INTRAVENOUS
  Filled 2014-05-25: qty 50

## 2014-05-25 MED ORDER — POTASSIUM CHLORIDE 10 MEQ/50ML IV SOLN
10.0000 meq | INTRAVENOUS | Status: AC
  Administered 2014-05-25 (×3): 10 meq via INTRAVENOUS
  Filled 2014-05-25 (×2): qty 50

## 2014-05-25 MED ORDER — HEPARIN SODIUM (PORCINE) 5000 UNIT/ML IJ SOLN
50000.0000 [IU] | INTRAVENOUS | Status: DC
  Administered 2014-05-25: 50000 [IU]
  Filled 2014-05-25: qty 10

## 2014-05-25 MED ORDER — POTASSIUM CHLORIDE 10 MEQ/50ML IV SOLN
10.0000 meq | INTRAVENOUS | Status: AC
  Administered 2014-05-25 (×3): 10 meq via INTRAVENOUS

## 2014-05-25 MED ORDER — PANTOPRAZOLE SODIUM 40 MG PO PACK
40.0000 mg | PACK | Freq: Every day | ORAL | Status: DC
  Administered 2014-05-25: 40 mg
  Filled 2014-05-25 (×2): qty 20

## 2014-05-25 NOTE — Progress Notes (Addendum)
Impella representative called to check in on patient and device.  Told rep about pt's day including chest tube dump and need for novaseven and platelets.  Rep seemed concerned with increased chances of clot or foreign bodies forming on Impella catheter.  Impella rep explained to this RN that ACTs should be between 160-180 and suggested that it might be good measure to see what the current ACT level was.  Rep suggested that if ACT was lower than 160 that this RN might suggest to the Provider to start small amount of heparinized solution in purge fluid. After talking to Impella representative this RN acquired an ACT on pt resulting in 122. Spoke with Dr. Cyndia Bent about findings and suggestions given to RN by Impella rep.  No new orders given at this time.  Will continue to monitor pt closely.

## 2014-05-25 NOTE — Op Note (Signed)
NAME:  Jeffrey [name], Jeffrey [name] NO.:  [id number]  MEDICAL RECORD NO.:  [id number]  LOCATION:  2S07C                        FACILITY:  [facility]  PHYSICIAN:  Ivin Poot, M.D.  DATE OF BIRTH:  November 07, 1940  DATE OF PROCEDURE: DATE OF DISCHARGE:                              OPERATIVE REPORT   OPERATION: 1. Coronary artery bypass grafting x4 (left internal mammary artery to     left anterior descending, saphenous vein graft to diagonal,     saphenous vein graft to posterolateral, saphenous vein graft to     obtuse marginal 1). 2. Placement of Impella 5.0 percutaneous left ventricular assist     device via 10 mm Dacron graft to right subclavian artery.  SURGEON:  Ivin Poot, M.D.  ASSISTANT:  John Giovanni, PA-C  PREOPERATIVE DIAGNOSES:  Acute non-ST-elevation myocardial infarction with cardiogenic shock, ischemic cardiomyopathy, severe three-vessel coronary artery disease, preoperative intra-aortic balloon pump.  POSTOPERATIVE DIAGNOSES:  Acute non-ST-elevation myocardial infarction with cardiogenic shock, ischemic cardiomyopathy, severe three-vessel coronary artery disease, preoperative intra-aortic balloon pump.  CLINICAL NOTE:  The patient is a 74 year old diabetic who presented to the hospital in cardiogenic shock, was found to have evidence of a non- ST-elevation MI.  Cardiac catheterization and echocardiogram showed severe LV dysfunction with EF 15%.  He had chronic occlusion of right coronary and severe disease of the LAD and circumflex vessels with graftable targets.  A balloon pump was placed at the time of cardiac catheterization, and he was placed on low-dose inotropes.  He also required amiodarone and lidocaine for ventricular arrhythmias.  He was stabilized and underwent evaluation for potential multivessel CABG.  The patient was not felt to be a candidate for percutaneous coronary intervention.  The patient had adequate targets and conduit and was  felt to be acceptable candidate for high-risk CABG as there was no other option for realistic improvement of cardiac function or discharge from the hospital.  I personally reviewed the images of his cardiac catheterizations and echocardiogram.  I felt that the patient would be an acceptable candidate for combined CABG with placement of a temporary percutaneous LVAD for postoperative cardiac support.  I discussed the procedure in detail with the patient and his family.  I discussed the indications, benefits, alternatives, and risks as well.  He basically had no realistic chance of discharge from the hospital without surgical revascularization.  I feel he had a very high risk of surviving surgical revascularization without temporary percutaneous LVAD support.  After reviewing all these issues with the patient and his family, informed consent was obtained.  OPERATIVE FINDINGS: 1. Severe three-vessel coronary disease with adequate, but suboptimal     targets. 2. Adequate conduit. 3. Severe LV dysfunction with thinning and chronic scarring of the     anterior apical wall and inferior apical walls.  OPERATIVE PROCEDURE:  The patient was brought to the operating room and placed supine on the operating table where general anesthesia was induced under invasive hemodynamic monitoring.  The transesophageal echo probe was placed by the anesthesiologist.  This showed severe LV dysfunction.  There was no significant valvular disease.  There was mild MR.  The patient was  prepped and draped as a sterile field.  A proper time-out was performed.  An incision was made beneath the right clavicle and the right pectoralis major and pectoralis minor were divided, and the subclavian-axillary artery was identified and encircled with vessel loops.  Care was taken to avoid injury to the brachial plexus cords. 4000 units of heparin was administered and the axillary artery was clamped proximally and distally.   An arteriotomy was made.  A 10 mm Hemashield graft was sewn end-to-side with running 5-0 Prolene.  A light layer of CoSeal medical adhesive was placed around the anastomosis and the clamps were removed.  There was good hemostasis.  Next, under C-arm guidance and transesophageal echoing, the Impella 5.0 percutaneous LVAD was placed via the right subclavian artery through the graft.  The guidewires and guide catheters were passed so as the wire was passed through the aortic valve.  Over the guidewire, the Impella catheter passed easily and was positioned at the right distance into the LV chamber.  It was turned on and immediately delivered over 4 L/minute of flow.  The balloon pump augmentation was reduced to 1 to 3 and the balloon volume was reduced as well.  Next, the sternotomy was performed as the saphenous vein was being harvested from the right leg.  The internal mammary artery was harvested as a pedicle graft from its origin at the subclavian vessels.  It was a 1.5-mm vessel with good flow.  The sternal retractor was placed and pericardium was opened and suspended.  Pursestrings were placed in the ascending aorta and right atrium and when the ACT was documented as being therapeutic, the patient was cannulated and placed on cardiopulmonary bypass.  The coronary arteries were identified for grafting.  The right coronary was chronically occluded, but the posterolateral branch was an adequate target.  The LAD was diffusely diseased, but an adequate target distally.  The OM vessel was the best target, but also had diffuse disease.  The diagonal branch of the LAD was a good target.  Cardioplegia cannulas were placed both antegrade and retrograde cardioplegia.  An LV vent was placed via the right superior pulmonary vein.  The patient was cooled to 32 degrees.  The aortic crossclamp was carefully applied on the ascending aorta over the Impella catheter. While we were on top, the Impella  flow was turned off.  1 L of cold blood cardioplegia was delivered in split doses between the antegrade aortic and retrograde coronary sinus catheters.  There was good cardioplegic arrest and septal temperature dropped to less than 15 degrees.  Cardioplegia was delivered every 20 minutes while the crossclamp was in place.  The distal coronary anastomoses were performed.  The first distal anastomosis was to the posterolateral branch of right coronary.  This was a 1.4-mm vessel with proximal total chronic occlusion.  A reverse saphenous vein was sewn end-to-side with running 7-0 Prolene with good flow through the graft.  Cardioplegia was redosed.  The second distal anastomosis was to the OM branch of left circumflex. This was a 1.5-mm vessel with proximal 80% to 90% stenosis.  A reverse saphenous vein was sewn end-to-side with running 7-0 Prolene with good flow through the graft.  Cardioplegia was redosed.  The third distal anastomosis was to the diagonal branch of the LAD. This was a 1.5-mm vessel with proximal ostial 90% stenosis.  A reverse saphenous vein was sewn end-to-side with running 7-0 Prolene with good flow through the graft.  Cardioplegia was redosed.  The  fourth distal anastomosis was to the distal LAD.  It was small, but adequate target.  The left IMA pedicle was brought through an opening and the left lateral pericardium was brought down onto the LAD and sewn end-to-side with running 8-0 Prolene.  There was good flow through the anastomosis and the bulldog clamp was reapplied to the pedicle, and the pedicle was secured to the epicardium.  Cardioplegia was redosed.  While the crossclamp was still in place, 3 proximal vein anastomoses were performed on the ascending aorta using a 4.0 mm punch running 6-0 Prolene.  Prior to removing the proximal anastomosis, air was vented from the coronaries with a dose of retrograde warm blood cardioplegia. The crossclamp was  removed.  The heart resumed a spontaneous rhythm.  The vein grafts were de-aired and opened.  Each had good flow and hemostasis was documented at the proximal and distal sites.  The patient was rewarmed and reperfused. Temporary pacing wires were applied including an LV wire.  The patient was started on low-dose inotropes.  The lungs re-expanded and the ventilator was resumed.  The patient was then weaned off cardiopulmonary bypass as we transitioned from cardiopulmonary bypass to Impella support.  The Impella had excellent 4.5 L/minute flow.  There was good decompression of the LV chamber and adequate RV function.  Protamine was administered without adverse reaction.  There was diffuse coagulopathy. The patient received platelets, FFP, as well as cryoprecipitate to allow adequate hemostasis.  Chest tubes were placed in the left pleural space and anterior mediastinal and posterior mediastinal positions and brought out through separate incisions.  The sternum was then closed with interrupted steel wire.  The patient remained stable.  The pectoralis fascia was closed with a running #1 Vicryl.  The subcutaneous and skin layers were closed in running Vicryl.  The right subclavicular incision was closed in layers using Vicryl and the skin was closed with staples. The Impella catheter was secured to the skin at the introducer site. The patient was then transported back to the ICU in critical, but stable condition.     Ivin Poot, M.D.     PV/MEDQ  D:  05/24/2014  T:  05/25/2014  Job:  [id number]  cc:   Shaune Pascal. Bensimhon, MD Belva Crome, M.D.

## 2014-05-25 NOTE — Progress Notes (Signed)
2 Days Post-Op Procedure(s) (LRB): CORONARY ARTERY BYPASS GRAFTING (CABG) x  four, using left internal mammary artery and right leg greater saphenous vein harvested endoscopically (N/A) PLACEMENT OF IMPELLA 5.0 LEFT VENTRICULAR ASSIST DEVICE (N/A) TRANSESOPHAGEAL ECHOCARDIOGRAM (TEE) (N/A) Subjective:  He remains sedated on the vent. Reportedly woke up and followed commands this am.  He has been hemodynamically stable on Milrinone 0.3, dop 5, levophed 10-13 and Impella. Flow is 4.5.  Frequent PVCs and couplets on lidocaine and amio.  Good urine output on lasix drip 5 mg/hr but wt still up 5 lbs and I>O. Lasix increased to 8 mg this am.  Objective: Vital signs in last 24 hours: Temp:  [97.5 F (36.4 C)-100 F (37.8 C)] 98.2 F (36.8 C) (05/14 1115) Pulse Rate:  [29-107] 34 (05/14 1115) Cardiac Rhythm:  [-] A-V Sequential paced (05/14 0800) Resp:  [11-29] 14 (05/14 1115) BP: (84-133)/(39-91) 99/39 mmHg (05/14 1100) SpO2:  [92 %-100 %] 99 % (05/14 1115) Arterial Line BP: (62-133)/(41-76) 116/54 mmHg (05/14 1115) FiO2 (%):  [40 %-50 %] 40 % (05/14 0812) Weight:  [105.4 kg (232 lb 5.8 oz)] 105.4 kg (232 lb 5.8 oz) (05/14 0300)  Hemodynamic parameters for last 24 hours: PAP: (23-39)/(16-27) 25/16 mmHg CVP:  [5 mmHg-17 mmHg] 5 mmHg CO:  [4.8 L/min-5.8 L/min] 5.8 L/min CI:  [2.4 L/min/m2-2.8 L/min/m2] 2.8 L/min/m2  Intake/Output from previous day: 05/13 0701 - 05/14 0700 In: 5861.5 [I.V.:3782.8; Blood:844.2; NG/GT:180; IV Piggyback:800] Out: 5275 [Urine:3725; Emesis/NG output:750; Chest Tube:800] Intake/Output this shift: Total I/O In: 547.2 [I.V.:491.9; Other:25.3; NG/GT:30] Out: 640 [Urine:610; Chest Tube:30]  Heart: regular rate and rhythm, S1, S2 normal, no murmur, click, rub or gallop Lungs: clear to auscultation bilaterally Abdomen: soft, no bowel sounds Extremities: anasarca Wound: dressings dry  Lab Results:  Recent Labs  05/24/14 0346  05/25/14 0400  05/25/14 1017  WBC 11.9*  --  19.7*  --   HGB 7.4*  < > 8.8* 10.2*  HCT 22.1*  < > 25.5* 30.0*  PLT 90*  --  101*  --   < > = values in this interval not displayed. BMET:  Recent Labs  05/24/14 0346  05/25/14 0400 05/25/14 1017  NA 124*  < > 121* 123*  K 4.7  < > 3.6 4.1  CL 95*  < > 91* 89*  CO2 23  --  23  --   GLUCOSE 119*  < > 115* 129*  BUN 15  < > 17 19  CREATININE 1.10  < > 1.21 1.30*  CALCIUM 7.5*  --  7.6*  --   < > = values in this interval not displayed.  PT/INR:  Recent Labs  05/24/14 0346  LABPROT 18.1*  INR 1.49   ABG    Component Value Date/Time   PHART 7.439 05/25/2014 0310   HCO3 23.6 05/25/2014 0310   TCO2 22 05/25/2014 1017   ACIDBASEDEF 0.0 05/25/2014 0310   O2SAT 98.7 05/25/2014 0310   CBG (last 3)   Recent Labs  05/24/14 2205 05/24/14 2352 05/25/14 0049  GLUCAP 142* 98 100*   CLINICAL DATA: Postop from CABG surgery.  EXAM: PORTABLE CHEST - 1 VIEW  COMPARISON: 05/24/2014  FINDINGS: Irregular interstitial opacities and hazy airspace opacity in the lungs is similar to the previous day's study. There is additional lung base opacity, right greater than left, likely small effusions. No gross pneumothorax on this supine exam.  No mediastinal widening.  Endotracheal tube, right internal jugular Swan-Ganz catheter, nasogastric tube, right PICC  and left chest tube are stable, and well-positioned.  IMPRESSION: 1. No change from the previous day's study. 2. Support apparatus is well positioned. 3. Persistent pulmonary edema and small pleural effusions.   Electronically Signed  By: Lajean Manes M.D.  On: 05/25/2014 08:05  Assessment/Plan: S/P Procedure(s) (LRB): CORONARY ARTERY BYPASS GRAFTING (CABG) x  four, using left internal mammary artery and right leg greater saphenous vein harvested endoscopically (N/A) PLACEMENT OF IMPELLA 5.0 LEFT VENTRICULAR ASSIST DEVICE (N/A) TRANSESOPHAGEAL ECHOCARDIOGRAM (TEE)  (N/A)  1. CV: stable on current drips. Titrate levophed as BP allows. Impella seems to be working ok but purge flows down this am so will start heparin through purge. May start heparin peripherally tomorrow.  2.  Resp: Remains on vent. Not ready to wean until pulmonary edema resolves.  3. Renal: creat up slightly. Lasix increased to 8 to remove volume. His albumin is only 2.2 which will exacerbate his peripheral edema.  4.  GI: NPO. Will need to start nutrition soon but he does not have bowel sounds yet.  5. Neuro: stable.   LOS: 11 days    Gaye Pollack 05/25/2014

## 2014-05-25 NOTE — Progress Notes (Addendum)
Advanced Heart Failure Rounding Note   Subjective:    Underwent CABG and Impella 5.0 placement on 5/12  Remains intubated and sedated. 40% FiO2. Vasopressin now off. On dopa, milrinone and levophed.  Flow ~4.5 on Impella. PA pressures and outputs look good. On lasix gtt at 5. U/o good but weight up.  Continue with frequent PVCs and couplets. No sustained VT.  Remains on amio and lido..    Objective:    Vital Signs:   Temp:  [97.5 F (36.4 C)-100 F (37.8 C)] 97.9 F (36.6 C) (05/14 0900) Pulse Rate:  [30-107] 91 (05/14 0900) Resp:  [10-29] 12 (05/14 0900) BP: (84-133)/(55-91) 94/67 mmHg (05/14 1000) SpO2:  [92 %-100 %] 100 % (05/14 0900) Arterial Line BP: (62-135)/(41-76) 105/65 mmHg (05/14 0900) FiO2 (%):  [40 %-50 %] 40 % (05/14 0812) Weight:  [105.4 kg (232 lb 5.8 oz)] 105.4 kg (232 lb 5.8 oz) (05/14 0300) Last BM Date: 05/22/14  Weight change: Filed Weights   05/19/2014 0500 05/24/14 0500 05/25/14 0300  Weight: 88.3 kg (194 lb 10.7 oz) 103.057 kg (227 lb 3.2 oz) 105.4 kg (232 lb 5.8 oz)    Intake/Output:   Intake/Output Summary (Last 24 hours) at 05/25/14 1037 Last data filed at 05/25/14 1000  Gross per 24 hour  Intake 5016.27 ml  Output   5370 ml  Net -353.73 ml     Physical Exam: General: Intubated sedated HEENT: normal Neck: supple. RIJ swan. No lymphadenopathy or thryomegaly appreciated. Cor: R subclavian Impella sheath. Sternal dressing Regular rhythm, controlled rate. impella hum Lungs: clear Abdomen: soft, nontender, + distended. No hepatosplenomegaly. No bruits or masses. hypoactive bowel sounds. Extremities: no cyanosis, clubbing, rash, 3+ edema.  Neuro: intubated sedated  Telemetry: NSR with frequent polymorphic PVCs  Labs: Basic Metabolic Panel:  Recent Labs Lab 05/22/14 0427 05/22/14 1907 06/10/2014 0130  06/07/2014 2206 06/04/2014 2221 05/24/14 0346 05/24/14 1552 05/24/14 1600 05/25/14 0400 05/25/14 0500 05/25/14 1017  NA 129* 121* 123*   < > 127*  --  124* 123*  --  121*  --  123*  K 4.6 4.5 4.5  < > 4.3  --  4.7 4.5  --  3.6  --  4.1  CL 97* 88* 91*  < > 93*  --  95* 94*  --  91*  --  89*  CO2 24 23 24   --   --   --  23  --   --  23  --   --   GLUCOSE 145* 299* 150*  < > 147*  --  119* 115*  --  115*  --  129*  BUN 15 17 16   < > 16  --  15 17  --  17  --  19  CREATININE 1.11 1.20 1.14  < > 1.00  --  1.10 1.20 1.26* 1.21  --  1.30*  CALCIUM 8.6* 8.1* 8.3*  --   --   --  7.5*  --   --  7.6*  --   --   MG  --   --  1.8  --   --  1.7 2.8*  --  2.2  --  1.8  --   < > = values in this interval not displayed.  Liver Function Tests:  Recent Labs Lab 05/22/14 1907 05/25/14 0400  AST 25 37  ALT 23 29  ALKPHOS 105 60  BILITOT 0.8 1.4*  PROT 6.0* 4.3*  ALBUMIN 2.7* 2.2*   CBC:  Recent  Labs Lab 05/31/2014 0430  06/07/2014 1348  06/09/2014 1724  05/28/2014 2220 05/24/14 0346 05/24/14 1552 05/25/14 0400 05/25/14 1017  WBC 11.5*  --   --   --  14.6*  --  13.9* 11.9*  --  19.7*  --   HGB 10.9*  < > 7.4*  < > 8.4*  < > 6.8* 7.4* 9.9* 8.8* 10.2*  HCT 33.4*  < > 23.5*  < > 25.5*  < > 21.1* 22.1* 29.0* 25.5* 30.0*  MCV 74.2*  --   --   --  76.3*  --  75.9* 75.9*  --  75.9*  --   PLT 88*  --  60*  --  REPEATED TO VERIFY  --  110* 90*  --  101*  --   < > = values in this interval not displayed.  Cardiac Enzymes: No results for input(s): CKTOTAL, CKMB, CKMBINDEX, TROPONINI in the last 168 hours.  BNP: BNP (last 3 results)  Recent Labs  06/06/2014 1630  BNP 885.0*    ProBNP (last 3 results) No results for input(s): PROBNP in the last 8760 hours.    Other results:  Imaging: Dg Chest Port 1 View  05/25/2014   CLINICAL DATA:  Postop from CABG surgery.  EXAM: PORTABLE CHEST - 1 VIEW  COMPARISON:  05/24/2014  FINDINGS: Irregular interstitial opacities and hazy airspace opacity in the lungs is similar to the previous day's study. There is additional lung base opacity, right greater than left, likely small effusions. No gross  pneumothorax on this supine exam.  No mediastinal widening.  Endotracheal tube, right internal jugular Swan-Ganz catheter, nasogastric tube, right PICC and left chest tube are stable, and well-positioned.  IMPRESSION: 1. No change from the previous day's study. 2. Support apparatus is well positioned. 3. Persistent pulmonary edema and small pleural effusions.   Electronically Signed   By: Lajean Manes M.D.   On: 05/25/2014 08:05   Dg Chest Port 1 View  05/24/2014   CLINICAL DATA:  Post CABG.  LVAD/yesterday.  Bleeding.  EXAM: PORTABLE CHEST - 1 VIEW  COMPARISON:  Radiograph 05/24/2014 at 632 hours  FINDINGS: Endotracheal and NGT and unchanged. Swan-Ganz catheter with tip main pulmonary artery. LVAD device from a right subclavian approach is unchanged. Right PICC line unchanged. Mediastinal drain noted. Left chest tube in place without pneumothorax.  Cardiac silhouette is stable compared to prior. There is mild diffuse airspace disease within the left and right lung which is unchanged. Bilateral pleural effusions are noted. No pneumothorax.  IMPRESSION: 1. Stable support apparatus. 2. Stable mild pulmonary edema. 3. No change in pleural effusions. 4. No pneumothorax.   Electronically Signed   By: Suzy Bouchard M.D.   On: 05/24/2014 14:36   Dg Chest Port 1 View  05/24/2014   CLINICAL DATA:  CABG and placement of left ventricular assist device yesterday.  EXAM: PORTABLE CHEST - 1 VIEW  COMPARISON:  One-view chest 05/21/2014  FINDINGS: Median sternotomy wires are noted. A left ventricular assist device is stable in position. The endotracheal tube terminates 3 cm above the carina. A Swan-Ganz catheter enters via a right IJ sheath and terminates in the main pulmonary outflow tract. Mediastinal drain and left sided pleural drains are stable. There is no pneumothorax. Bilateral pleural effusions and edema are relatively stable. Lung volumes are slightly improved.  IMPRESSION: 1. Status post CABG and placement of  left ventricular assist device. 2. Support apparatus is stable. 3. Persistent edema and bilateral pleural effusions. 4. Overall  lung volumes are slightly improved.   Electronically Signed   By: San Morelle M.D.   On: 05/24/2014 07:54   Dg Chest Port 1 View  05/13/2014   CLINICAL DATA:  Coronary artery disease.  Status post CABG.  EXAM: PORTABLE CHEST - 1 VIEW  COMPARISON:  Chest x-ray dated 05/21/2014  FINDINGS: Endotracheal tube, Swan-Ganz catheter, NG tube, chest tubes, and Impella left ventricular assist device are in place.  There is slight bilateral pulmonary edema. Pulmonary vascularity is normal. No pneumothorax.  IMPRESSION: Tubes and lines appear in good position. No pneumothorax. Slight pulmonary edema.   Electronically Signed   By: Lorriane Shire M.D.   On: 05/15/2014 16:55   Dg C-arm 1-60 Min-no Report  05/29/2014   CLINICAL DATA: Impella device insertion   C-ARM 1-60 MINUTES  Fluoroscopy was utilized by the requesting physician.  No radiographic  interpretation.      Medications:     Scheduled Medications: . acetaminophen  1,000 mg Oral 4 times per day   Or  . acetaminophen (TYLENOL) oral liquid 160 mg/5 mL  1,000 mg Per Tube 4 times per day  . antiseptic oral rinse  7 mL Mouth Rinse QID  . aspirin EC  325 mg Oral Daily   Or  . aspirin  324 mg Per Tube Daily  . bisacodyl  10 mg Oral Daily   Or  . bisacodyl  10 mg Rectal Daily  . chlorhexidine  15 mL Mouth Rinse BID  . docusate sodium  200 mg Oral Daily  . insulin regular  0-10 Units Intravenous TID WC  . lidocaine  100 mg Intravenous Once  . metoprolol tartrate  12.5 mg Oral BID   Or  . metoprolol tartrate  12.5 mg Per Tube BID  . pantoprazole  40 mg Oral Daily  . sodium chloride  3 mL Intravenous Q12H    Infusions: . sodium chloride 10 mL/hr at 05/25/14 0800  . sodium chloride    . sodium chloride 20 mL/hr at 05/25/14 0400  . amiodarone 30 mg/hr (05/20/2014 2200)  . amiodarone 30 mg/hr (05/25/14 0800)  .  dexmedetomidine 0.7 mcg/kg/hr (05/25/14 0800)  . dextrose 5 % Impella 5.0 Purge solution    . DOPamine 5 mcg/kg/min (05/25/14 0800)  . furosemide (LASIX) infusion 5 mg/hr (05/25/14 0800)  . insulin (NOVOLIN-R) infusion 0.5 Units/hr (05/25/14 0800)  . lactated ringers Stopped (05/24/14 0400)  . lactated ringers 20 mL/hr at 05/25/14 0800  . lidocaine 1 mg/min (05/25/14 0826)  . midazolam (VERSED) infusion 2 mg/hr (05/25/14 0800)  . milrinone 0.3 mcg/kg/min (05/25/14 0800)  . nitroGLYCERIN 0 mcg/min (06/11/2014 1700)  . norepinephrine (LEVOPHED) Adult infusion 13 mcg/min (05/25/14 0915)  . phenylephrine (NEO-SYNEPHRINE) Adult infusion 0 mcg/min (05/31/2014 1700)  . vasopressin (PITRESSIN) infusion - *FOR SHOCK* Stopped (05/25/14 0415)    PRN Medications: sodium chloride, levalbuterol, metoprolol, morphine injection, ondansetron (ZOFRAN) IV, oxyCODONE, sodium chloride, traMADol   Assessment:   1. Cardiogenic shock 2. Ventricular fibrillation arrest 5/6 in setting of milrinone use 3. 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 3v CAD/NSTEMI: Occluded RCA, subtotally occluded prox LAD, 90% LCx 5. DM2 6. Atrial fibrillation, newly diagnosed on admission -> NSR due to defib 5/10 7. Microcytic anemia/thrombocytopenia  8. Acute kidney injury, improving 9. 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 Thrombocytopenia 11. Acute delirium 12. Hyponatremia  Plan/Discussion:    S/p  CABG with Impella 5.0  support 05/24/2014  Hemodynamically stable but still with significant ventricular ectopy. Will continue IV amio and lido. I am worried he may continue to have VT despite revascularization due to scar-mediated VT and may need EP to see again to discuss other strategies. Can consider ranexa. Continue to supp K and Mag.   Agree with diuresis. Will increase lasix gtt. With severe hyponatremia will give one dose  tolvaptan down tube. Wean vent and pressors as tolerated.    The patient is critically ill with multiple organ systems failure and requires high complexity decision making for assessment and support, frequent evaluation and titration of therapies, application of advanced monitoring technologies and extensive interpretation of multiple databases.   Critical Care Time devoted to patient care services described in this note is 35 Minutes.   Summit Borchardt,MD 10:37 AM  Addendum:  Discussed with Dr. Caryl Comes. Suggested stopping lidocaine. Continue amiodarone.  Jakoby Melendrez,MD 11:41 AM

## 2014-05-25 NOTE — Progress Notes (Signed)
Patient ID: Jeffrey [name], male   DOB: 1940-12-24, 74 y.o.   MRN: [id number]  SICU Evening rounds:  Hemodynamics stable on same drips Impella functioning well with flow 4.5. Purge flow increased since heparin started. ACT still 120's. Will plan to start peripheral heparin tomorrow.  Diuresing well on lasix drip Chest tube output low  BMET    Component Value Date/Time   NA 123* 05/25/2014 1017   K 4.1 05/25/2014 1017   CL 89* 05/25/2014 1017   CO2 23 05/25/2014 0400   GLUCOSE 129* 05/25/2014 1017   BUN 19 05/25/2014 1017   CREATININE 1.30* 05/25/2014 1017   CALCIUM 7.6* 05/25/2014 0400   GFRNONAA 58* 05/25/2014 0400   GFRAA >60 05/25/2014 0400    CBC    Component Value Date/Time   WBC 19.7* 05/25/2014 0400   RBC 3.36* 05/25/2014 0400   HGB 10.2* 05/25/2014 1017   HCT 30.0* 05/25/2014 1017   PLT 101* 05/25/2014 0400   MCV 75.9* 05/25/2014 0400   MCH 26.2 05/25/2014 0400   MCHC 34.5 05/25/2014 0400   RDW 16.4* 05/25/2014 0400   LYMPHSABS 1.2 05/25/2014 1630   MONOABS 0.5 05/21/2014 1630   EOSABS 0.2 05/22/2014 1630   BASOSABS 0.1 06/11/2014 1630

## 2014-05-26 ENCOUNTER — Inpatient Hospital Stay (HOSPITAL_COMMUNITY): Payer: Commercial Managed Care - HMO

## 2014-05-26 DIAGNOSIS — Z95811 Presence of heart assist device: Secondary | ICD-10-CM

## 2014-05-26 LAB — COMPREHENSIVE METABOLIC PANEL
ALT: 27 U/L (ref 17–63)
AST: 28 U/L (ref 15–41)
Albumin: 2.3 g/dL — ABNORMAL LOW (ref 3.5–5.0)
Alkaline Phosphatase: 75 U/L (ref 38–126)
Anion gap: 8 (ref 5–15)
BUN: 16 mg/dL (ref 6–20)
CO2: 24 mmol/L (ref 22–32)
Calcium: 7.7 mg/dL — ABNORMAL LOW (ref 8.9–10.3)
Chloride: 94 mmol/L — ABNORMAL LOW (ref 101–111)
Creatinine, Ser: 1.36 mg/dL — ABNORMAL HIGH (ref 0.61–1.24)
GFR calc Af Amer: 59 mL/min — ABNORMAL LOW (ref >60)
GFR calc non Af Amer: 51 mL/min — ABNORMAL LOW (ref >60)
Glucose, Bld: 93 mg/dL (ref 65–99)
Potassium: 3.4 mmol/L — ABNORMAL LOW (ref 3.5–5.1)
Sodium: 126 mmol/L — ABNORMAL LOW (ref 135–145)
Total Bilirubin: 1.3 mg/dL — ABNORMAL HIGH (ref 0.3–1.2)
Total Protein: 4.6 g/dL — ABNORMAL LOW (ref 6.5–8.1)

## 2014-05-26 LAB — GLUCOSE, CAPILLARY
GLUCOSE-CAPILLARY: 115 mg/dL — AB (ref 65–99)
GLUCOSE-CAPILLARY: 113 mg/dL — AB (ref 65–99)
GLUCOSE-CAPILLARY: 116 mg/dL — AB (ref 65–99)
Glucose-Capillary: 103 mg/dL — ABNORMAL HIGH (ref 65–99)
Glucose-Capillary: 106 mg/dL — ABNORMAL HIGH (ref 65–99)
Glucose-Capillary: 110 mg/dL — ABNORMAL HIGH (ref 65–99)
Glucose-Capillary: 113 mg/dL — ABNORMAL HIGH (ref 65–99)
Glucose-Capillary: 113 mg/dL — ABNORMAL HIGH (ref 65–99)
Glucose-Capillary: 114 mg/dL — ABNORMAL HIGH (ref 65–99)
Glucose-Capillary: 114 mg/dL — ABNORMAL HIGH (ref 65–99)
Glucose-Capillary: 115 mg/dL — ABNORMAL HIGH (ref 65–99)
Glucose-Capillary: 117 mg/dL — ABNORMAL HIGH (ref 65–99)
Glucose-Capillary: 119 mg/dL — ABNORMAL HIGH (ref 65–99)
Glucose-Capillary: 122 mg/dL — ABNORMAL HIGH (ref 65–99)
Glucose-Capillary: 122 mg/dL — ABNORMAL HIGH (ref 65–99)
Glucose-Capillary: 123 mg/dL — ABNORMAL HIGH (ref 65–99)
Glucose-Capillary: 132 mg/dL — ABNORMAL HIGH (ref 65–99)
Glucose-Capillary: 165 mg/dL — ABNORMAL HIGH (ref 65–99)
Glucose-Capillary: 173 mg/dL — ABNORMAL HIGH (ref 65–99)
Glucose-Capillary: 80 mg/dL (ref 65–99)
Glucose-Capillary: 86 mg/dL (ref 65–99)

## 2014-05-26 LAB — CULTURE, RESPIRATORY W GRAM STAIN

## 2014-05-26 LAB — POCT I-STAT, CHEM 8
BUN: 18 mg/dL (ref 6–20)
BUN: 17 mg/dL (ref 6–20)
BUN: 16 mg/dL (ref 6–20)
CALCIUM ION: 1.16 mmol/L (ref 1.13–1.30)
CALCIUM ION: 1.15 mmol/L (ref 1.13–1.30)
CHLORIDE: 94 mmol/L — AB (ref 101–111)
CREATININE: 1.4 mg/dL — AB (ref 0.61–1.24)
Calcium, Ion: 1.11 mmol/L — ABNORMAL LOW (ref 1.13–1.30)
Chloride: 92 mmol/L — ABNORMAL LOW (ref 101–111)
Chloride: 94 mmol/L — ABNORMAL LOW (ref 101–111)
Creatinine, Ser: 1.4 mg/dL — ABNORMAL HIGH (ref 0.61–1.24)
Creatinine, Ser: 1.3 mg/dL — ABNORMAL HIGH (ref 0.61–1.24)
Glucose, Bld: 99 mg/dL (ref 65–99)
Glucose, Bld: 125 mg/dL — ABNORMAL HIGH (ref 65–99)
Glucose, Bld: 135 mg/dL — ABNORMAL HIGH (ref 65–99)
HEMATOCRIT: 29 % — AB (ref 39.0–52.0)
HEMATOCRIT: 29 % — AB (ref 39.0–52.0)
HEMATOCRIT: 32 % — AB (ref 39.0–52.0)
Hemoglobin: 9.9 g/dL — ABNORMAL LOW (ref 13.0–17.0)
Hemoglobin: 9.9 g/dL — ABNORMAL LOW (ref 13.0–17.0)
Hemoglobin: 10.9 g/dL — ABNORMAL LOW (ref 13.0–17.0)
POTASSIUM: 3.7 mmol/L (ref 3.5–5.1)
Potassium: 3.3 mmol/L — ABNORMAL LOW (ref 3.5–5.1)
Potassium: 3.7 mmol/L (ref 3.5–5.1)
SODIUM: 128 mmol/L — AB (ref 135–145)
Sodium: 130 mmol/L — ABNORMAL LOW (ref 135–145)
Sodium: 131 mmol/L — ABNORMAL LOW (ref 135–145)
TCO2: 23 mmol/L (ref 0–100)
TCO2: 24 mmol/L (ref 0–100)
TCO2: 25 mmol/L (ref 0–100)

## 2014-05-26 LAB — POCT ACTIVATED CLOTTING TIME
ACTIVATED CLOTTING TIME: 116 s
ACTIVATED CLOTTING TIME: 134 s
ACTIVATED CLOTTING TIME: 140 s
ACTIVATED CLOTTING TIME: 140 s
ACTIVATED CLOTTING TIME: 165 s
ACTIVATED CLOTTING TIME: 165 s
Activated Clotting Time: 140 seconds
Activated Clotting Time: 147 seconds
Activated Clotting Time: 159 seconds
Activated Clotting Time: 159 seconds
Activated Clotting Time: 159 seconds

## 2014-05-26 LAB — POCT I-STAT 3, ART BLOOD GAS (G3+)
Acid-Base Excess: 5 mmol/L — ABNORMAL HIGH (ref 0.0–2.0)
Acid-Base Excess: 8 mmol/L — ABNORMAL HIGH (ref 0.0–2.0)
Bicarbonate: 28.1 mEq/L — ABNORMAL HIGH (ref 20.0–24.0)
Bicarbonate: 30.8 mEq/L — ABNORMAL HIGH (ref 20.0–24.0)
O2 SAT: 99 %
O2 SAT: 100 %
PH ART: 7.547 — AB (ref 7.350–7.450)
PO2 ART: 121 mmHg — AB (ref 80.0–100.0)
Patient temperature: 36.5
TCO2: 29 mmol/L (ref 0–100)
TCO2: 32 mmol/L (ref 0–100)
pCO2 arterial: 36.9 mmHg (ref 35.0–45.0)
pCO2 arterial: 35.4 mmHg (ref 35.0–45.0)
pH, Arterial: 7.491 — ABNORMAL HIGH (ref 7.350–7.450)
pO2, Arterial: 152 mmHg — ABNORMAL HIGH (ref 80.0–100.0)

## 2014-05-26 LAB — PREPARE RBC (CROSSMATCH)

## 2014-05-26 LAB — CBC
HCT: 26.3 % — ABNORMAL LOW (ref 39.0–52.0)
Hemoglobin: 8.9 g/dL — ABNORMAL LOW (ref 13.0–17.0)
MCH: 25.9 pg — ABNORMAL LOW (ref 26.0–34.0)
MCHC: 33.8 g/dL (ref 30.0–36.0)
MCV: 76.5 fL — ABNORMAL LOW (ref 78.0–100.0)
Platelets: 91 10*3/uL — ABNORMAL LOW (ref 150–400)
RBC: 3.44 MIL/uL — ABNORMAL LOW (ref 4.22–5.81)
RDW: 16.7 % — ABNORMAL HIGH (ref 11.5–15.5)
WBC: 19 10*3/uL — ABNORMAL HIGH (ref 4.0–10.5)

## 2014-05-26 LAB — LACTATE DEHYDROGENASE: LDH: 380 U/L — ABNORMAL HIGH (ref 98–192)

## 2014-05-26 LAB — POTASSIUM: POTASSIUM: 3.7 mmol/L (ref 3.5–5.1)

## 2014-05-26 LAB — CARBOXYHEMOGLOBIN
CARBOXYHEMOGLOBIN: 1.6 % — AB (ref 0.5–1.5)
Methemoglobin: 1.2 % (ref 0.0–1.5)
O2 SAT: 77.3 %
Total hemoglobin: 9.9 g/dL — ABNORMAL LOW (ref 13.5–18.0)

## 2014-05-26 LAB — MAGNESIUM
Magnesium: 1.7 mg/dL (ref 1.7–2.4)
Magnesium: 2.2 mg/dL (ref 1.7–2.4)

## 2014-05-26 LAB — APTT: aPTT: 44 s — ABNORMAL HIGH (ref 24–37)

## 2014-05-26 MED ORDER — SODIUM CHLORIDE 0.9 % IV SOLN: Freq: Once | INTRAVENOUS | Status: DC

## 2014-05-26 MED ORDER — MAGNESIUM SULFATE 2 GM/50ML IV SOLN
2.0000 g | Freq: Once | INTRAVENOUS | Status: AC
  Administered 2014-05-26: 2 g via INTRAVENOUS
  Filled 2014-05-26: qty 50

## 2014-05-26 MED ORDER — MAGNESIUM SULFATE 2 GM/50ML IV SOLN
2.0000 g | Freq: Once | INTRAVENOUS | Status: AC
  Administered 2014-05-26: 2 g via INTRAVENOUS

## 2014-05-26 MED ORDER — POTASSIUM CHLORIDE 10 MEQ/50ML IV SOLN
10.0000 meq | INTRAVENOUS | Status: AC
  Administered 2014-05-26 – 2014-05-27 (×3): 10 meq via INTRAVENOUS
  Filled 2014-05-26 (×3): qty 50

## 2014-05-26 MED ORDER — POTASSIUM CHLORIDE 10 MEQ/50ML IV SOLN
INTRAVENOUS | Status: AC
  Filled 2014-05-26: qty 50

## 2014-05-26 MED ORDER — AMIODARONE IV BOLUS ONLY 150 MG/100ML
150.0000 mg | Freq: Once | INTRAVENOUS | Status: AC
  Administered 2014-05-26: 150 mg via INTRAVENOUS

## 2014-05-26 MED ORDER — POTASSIUM CHLORIDE 10 MEQ/50ML IV SOLN
10.0000 meq | INTRAVENOUS | Status: AC
  Administered 2014-05-26 (×3): 10 meq via INTRAVENOUS

## 2014-05-26 MED ORDER — LIDOCAINE BOLUS VIA INFUSION
100.0000 mg | Freq: Once | INTRAVENOUS | Status: AC
  Administered 2014-05-26: 100 mg via INTRAVENOUS
  Filled 2014-05-26: qty 100

## 2014-05-26 MED ORDER — POTASSIUM CHLORIDE 10 MEQ/50ML IV SOLN
10.0000 meq | INTRAVENOUS | Status: AC
  Administered 2014-05-26 (×5): 10 meq via INTRAVENOUS
  Filled 2014-05-26 (×2): qty 50

## 2014-05-26 MED ORDER — POTASSIUM CHLORIDE 10 MEQ/50ML IV SOLN
10.0000 meq | INTRAVENOUS | Status: AC
  Administered 2014-05-26 (×5): 10 meq via INTRAVENOUS

## 2014-05-26 MED ORDER — POTASSIUM CHLORIDE 10 MEQ/50ML IV SOLN
INTRAVENOUS | Status: AC
  Filled 2014-05-26: qty 150

## 2014-05-26 MED ORDER — MAGNESIUM SULFATE 2 GM/50ML IV SOLN
INTRAVENOUS | Status: AC
  Filled 2014-05-26: qty 50

## 2014-05-26 MED ORDER — HEPARIN (PORCINE) IN NACL 100-0.45 UNIT/ML-% IJ SOLN
1000.0000 [IU]/h | INTRAMUSCULAR | Status: DC
  Administered 2014-05-26: 500 [IU]/h via INTRAVENOUS
  Administered 2014-05-27 – 2014-05-29 (×3): 1300 [IU]/h via INTRAVENOUS
  Administered 2014-05-30: 500 [IU]/h via INTRAVENOUS
  Administered 2014-05-30: 1500 [IU]/h via INTRAVENOUS
  Filled 2014-05-26 (×11): qty 250

## 2014-05-26 MED ORDER — LIDOCAINE IN D5W 4-5 MG/ML-% IV SOLN: 1.0000 mg/min | INTRAVENOUS | Status: DC

## 2014-05-26 MED ORDER — POTASSIUM CHLORIDE 10 MEQ/50ML IV SOLN
INTRAVENOUS | Status: AC
  Filled 2014-05-26: qty 250

## 2014-05-26 MED ORDER — LIDOCAINE HCL (CARDIAC) 20 MG/ML IV SOLN
75.0000 mg | Freq: Once | INTRAVENOUS | Status: AC
  Administered 2014-05-26: 75 mg via INTRAVENOUS

## 2014-05-26 MED ORDER — DOBUTAMINE IN D5W 4-5 MG/ML-% IV SOLN
2.5000 ug/kg/min | INTRAVENOUS | Status: DC
  Administered 2014-05-26 – 2014-05-29 (×2): 2.5 ug/kg/min via INTRAVENOUS
  Filled 2014-05-26 (×3): qty 250

## 2014-05-26 MED ORDER — HEPARIN SODIUM (PORCINE) 5000 UNIT/ML IJ SOLN
50000.0000 [IU] | INTRAMUSCULAR | Status: DC
  Administered 2014-05-28 – 2014-05-31 (×2): 50000 [IU]
  Filled 2014-05-26 (×5): qty 10

## 2014-05-26 MED ORDER — PANTOPRAZOLE SODIUM 40 MG IV SOLR
40.0000 mg | INTRAVENOUS | Status: DC
  Administered 2014-05-26 – 2014-05-30 (×5): 40 mg via INTRAVENOUS
  Filled 2014-05-26 (×8): qty 40

## 2014-05-26 MED ORDER — LIDOCAINE IN D5W 4-5 MG/ML-% IV SOLN
1.0000 mg/min | INTRAVENOUS | Status: DC
  Administered 2014-05-26 – 2014-06-01 (×5): 1 mg/min via INTRAVENOUS
  Filled 2014-05-26 (×6): qty 500

## 2014-05-26 NOTE — Progress Notes (Signed)
At approximately 0315 pt's blood pressure began to drop rapidly.  Increased levophed and dopamine as well as started fluid bolus.  Pt's labs sent. Chest x-ray called STAT.  Pt responded to with increased BP.  Pt noted to be in sustained v-tach at 0336.  Defib pads placed on pt and 1 shock delivered.  Pt back to DDD paced rhythm.  MD paged, orders received.  Will continue to monitor pt closely.

## 2014-05-26 NOTE — Progress Notes (Signed)
Spoke with Dr. Ron Parker and advised him that the pt had another run of sustained vtach at 1504 and a shock was delivered.  Bolus was infusing when this occurred.  Dr. Ron Parker advised not to administer another bolus at this time and to advise him if the pt is shocked again.

## 2014-05-26 NOTE — Progress Notes (Signed)
Pt in sustained vtach at two different times.  First shock administered at 1358 and again at 1421.  Dr. Cyndia Bent was advised after the first event and no new orders were given.  Chem 8 had a k+ of 3.7 - three runs of potassium will be administered.  Magnesium level is pending with the lab.  Cardiology is now being paged as a result of the second event.  Will continue to monitor.

## 2014-05-26 NOTE — Progress Notes (Signed)
ANTICOAGULATION CONSULT NOTE - Initial Consult  Pharmacy Consult for heparin Indication: Impella 5.0  Allergies  Allergen Reactions  . Ambien [Zolpidem Tartrate] Other (See Comments)    Agitated delirium secondary to Ambien administration on 05/21/14  . Penicillins Rash    Patient Measurements: Height: 5\' 9"  (175.3 cm) Weight: 219 lb 2.2 oz (99.4 kg) IBW/kg (Calculated) : 70.7 Heparin Dosing Weight: 88kg  Vital Signs: Temp: 98.1 F (36.7 C) (05/15 0945) BP: 81/66 mmHg (05/15 1100) Pulse Rate: 42 (05/15 0945)  Labs:  Recent Labs  06/04/2014 1724  05/24/14 0346  05/25/14 0400  05/25/14 2056 05/26/14 0320 05/26/14 0345  HGB 8.4*  < > 7.4*  < > 8.8*  < > 8.8* 8.9* 9.9*  HCT 25.5*  < > 22.1*  < > 25.5*  < > 26.0* 26.3* 29.0*  PLT REPEATED TO VERIFY  < > 90*  --  101*  --   --  91*  --   APTT 37  --  43*  --  32  --   --  44*  --   LABPROT 19.7*  --  18.1*  --   --   --   --   --   --   INR 1.65*  --  1.49  --   --   --   --   --   --   CREATININE  --   < > 1.10  < > 1.21  < > 1.40* 1.36* 1.40*  < > = values in this interval not displayed.  Estimated Creatinine Clearance: 56.3 mL/min (by C-G formula based on Cr of 1.4).   Medical History: Past Medical History  Diagnosis Date  . Type 2 diabetes mellitus   . Hypothyroidism   . Hyperlipidemia   . Essential hypertension   . Insomnia   . Depression   . Atrial fibrillation     Diagnosed May 2016    Medications:  Scheduled:  . sodium chloride   Intravenous Once  . acetaminophen  1,000 mg Oral 4 times per day   Or  . acetaminophen (TYLENOL) oral liquid 160 mg/5 mL  1,000 mg Per Tube 4 times per day  . antiseptic oral rinse  7 mL Mouth Rinse QID  . aspirin EC  325 mg Oral Daily   Or  . aspirin  324 mg Per Tube Daily  . bisacodyl  10 mg Oral Daily   Or  . bisacodyl  10 mg Rectal Daily  . chlorhexidine  15 mL Mouth Rinse BID  . docusate sodium  200 mg Oral Daily  . insulin regular  0-10 Units Intravenous TID WC   . lidocaine  100 mg Intravenous Once  . magnesium sulfate      . pantoprazole (PROTONIX) IV  40 mg Intravenous Q24H  . potassium chloride  10 mEq Intravenous Q1 Hr x 5  . potassium chloride      . sodium chloride  3 mL Intravenous Q12H   Infusions:  . sodium chloride 10 mL/hr at 05/26/14 0000  . sodium chloride    . sodium chloride 10 mL/hr at 05/26/14 0000  . amiodarone 30 mg/hr (06/06/2014 2200)  . amiodarone 60 mg/hr (05/26/14 0800)  . dexmedetomidine 0.7 mcg/kg/hr (05/26/14 1017)  . dextrose 5 % Impella 5.0 Purge solution    . DOPamine 4 mcg/kg/min (05/26/14 1000)  . furosemide (LASIX) infusion 8 mg/hr (05/26/14 0800)  . impella catheter heparin 50 unit/mL in dextrose 5%    . heparin 800  Units/hr (05/26/14 1205)  . insulin (NOVOLIN-R) infusion 2.2 Units/hr (05/26/14 1000)  . lactated ringers Stopped (05/24/14 0400)  . lactated ringers 20 mL/hr at 05/26/14 0000  . midazolam (VERSED) infusion 1 mg/hr (05/26/14 0800)  . milrinone 0.3 mcg/kg/min (05/26/14 0800)  . nitroGLYCERIN 0 mcg/min (06/09/2014 1700)  . norepinephrine (LEVOPHED) Adult infusion 10 mcg/min (05/26/14 1000)  . 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 He is on heparin through the purge at 7.6 ml/hr (380 units/hr of heparin) and also on systemic heparin at 600 units/hr (total heparin = 980 units/hr; ~ 11 units/kg/hr).   -ACT was 140 at 11am and heparin was increased from 500 units/hr to 600 units/hr. The last ACT after increase to 600 units/hr was 140 per RN.   Goal of Therapy:  ACT= 160-180 Monitor platelets by anticoagulation protocol: Yes   Plan:  -Due to ACTs < 160 x2, increase systemic heparin to 800 units/hr -Hourly ACTs -Will follow patient progress  Hildred Laser, Pharm D 05/26/2014 12:29 PM

## 2014-05-26 NOTE — Progress Notes (Signed)
ANTICOAGULATION CONSULT NOTE - Initial Consult  Pharmacy Consult for heparin Indication: Impella 5.0  Allergies  Allergen Reactions  . Ambien [Zolpidem Tartrate] Other (See Comments)    Agitated delirium secondary to Ambien administration on 05/21/14  . Penicillins Rash    Patient Measurements: Height: 5\' 9"  (175.3 cm) Weight: 219 lb 2.2 oz (99.4 kg) IBW/kg (Calculated) : 70.7 Heparin Dosing Weight: 88kg  Vital Signs: Temp: 98.1 F (36.7 C) (05/15 1445) BP: 81/66 mmHg (05/15 1100) Pulse Rate: 82 (05/15 1400)  Labs:  Recent Labs  06/03/2014 1724  05/24/14 0346  05/25/14 0400  05/26/14 0320 05/26/14 0345 05/26/14 1400  HGB 8.4*  < > 7.4*  < > 8.8*  < > 8.9* 9.9* 9.9*  HCT 25.5*  < > 22.1*  < > 25.5*  < > 26.3* 29.0* 29.0*  PLT REPEATED TO VERIFY  < > 90*  --  101*  --  91*  --   --   APTT 37  --  43*  --  32  --  44*  --   --   LABPROT 19.7*  --  18.1*  --   --   --   --   --   --   INR 1.65*  --  1.49  --   --   --   --   --   --   CREATININE  --   < > 1.10  < > 1.21  < > 1.36* 1.40* 1.40*  < > = values in this interval not displayed.  Estimated Creatinine Clearance: 56.3 mL/min (by C-G formula based on Cr of 1.4).   Medical History: Past Medical History  Diagnosis Date  . Type 2 diabetes mellitus   . Hypothyroidism   . Hyperlipidemia   . Essential hypertension   . Insomnia   . Depression   . Atrial fibrillation     Diagnosed May 2016    Medications:  Scheduled:  . sodium chloride   Intravenous Once  . acetaminophen  1,000 mg Oral 4 times per day   Or  . acetaminophen (TYLENOL) oral liquid 160 mg/5 mL  1,000 mg Per Tube 4 times per day  . antiseptic oral rinse  7 mL Mouth Rinse QID  . aspirin EC  325 mg Oral Daily   Or  . aspirin  324 mg Per Tube Daily  . bisacodyl  10 mg Oral Daily   Or  . bisacodyl  10 mg Rectal Daily  . chlorhexidine  15 mL Mouth Rinse BID  . docusate sodium  200 mg Oral Daily  . insulin regular  0-10 Units Intravenous TID WC   . lidocaine  100 mg Intravenous Once  . magnesium sulfate      . pantoprazole (PROTONIX) IV  40 mg Intravenous Q24H  . potassium chloride  10 mEq Intravenous Q1 Hr x 3  . potassium chloride      . potassium chloride      . sodium chloride  3 mL Intravenous Q12H   Infusions:  . sodium chloride 10 mL/hr at 05/26/14 0000  . sodium chloride    . sodium chloride 10 mL/hr at 05/26/14 0000  . amiodarone 30 mg/hr (06/11/2014 2200)  . amiodarone 60 mg/hr (05/26/14 0800)  . dexmedetomidine 0.9 mcg/kg/hr (05/26/14 1200)  . dextrose 5 % Impella 5.0 Purge solution    . DOPamine 3.5 mcg/kg/min (05/26/14 1200)  . furosemide (LASIX) infusion 8 mg/hr (05/26/14 0800)  . impella catheter heparin 50 unit/mL in  dextrose 5%    . heparin 1,000 Units/hr (05/26/14 1510)  . insulin (NOVOLIN-R) infusion 1.6 Units/hr (05/26/14 1500)  . lactated ringers Stopped (05/24/14 0400)  . lactated ringers 20 mL/hr at 05/26/14 0000  . midazolam (VERSED) infusion 3 mg/hr (05/26/14 1130)  . milrinone 0.3 mcg/kg/min (05/26/14 0800)  . nitroGLYCERIN 0 mcg/min (05/18/2014 1700)  . norepinephrine (LEVOPHED) Adult infusion 18 mcg/min (05/26/14 1510)  . 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 He is on heparin through the purge at 7.5 ml/hr (375 units/hr of heparin) and also on systemic heparin at 1000 units/hr (total heparin = 1375 units/hr; ~ 15 units/kg/hr).   -hourly ACT trend: 140>>147>>159.   Goal of Therapy:  ACT= 160-180 Monitor platelets by anticoagulation protocol: Yes   Plan:  -Maintain heparin at 1000 units/hr (anticipate further aPTT trend up) -Hourly ACTs -Will follow patient progress  Hildred Laser, Pharm D 05/26/2014 3:31 PM

## 2014-05-26 NOTE — Progress Notes (Signed)
Discussed rising ACT with Pharmacy.  Current ACT is 159 - will not increase rate per protocol since we are close to therapeutic and the pt has been responding well to the infusion.

## 2014-05-26 NOTE — Progress Notes (Signed)
3 Days Post-Op Procedure(s) (LRB): CORONARY ARTERY BYPASS GRAFTING (CABG) x  four, using left internal mammary artery and right leg greater saphenous vein harvested endoscopically (N/A) PLACEMENT OF IMPELLA 5.0 LEFT VENTRICULAR ASSIST DEVICE (N/A) TRANSESOPHAGEAL ECHOCARDIOGRAM (TEE) (N/A) Subjective:  He remains sedated on the vent. Reportedly woke up and followed commands this am.  Had VT early this am and got shocked, lido increased to 2 mg and given 75 mg bolus with resolution of runs of VT. K+ and Mg2+ were a little low. Lidocaine now off.  He is hemodynamically stable on Milrinone 0.3, dop 5, levophed 10-13 and Impella. Flow is 4.5.  Amiodarone for arrhythmias.  Excellent diuresis yesterday after Tolvaptan and lasix drip increase to 8. Wt down 13 lbs if accurate.  Objective: Vital signs in last 24 hours: Temp:  [97.9 F (36.6 C)-99.7 F (37.6 C)] 98.1 F (36.7 C) (05/15 0730) Pulse Rate:  [29-101] 91 (05/15 0730) Cardiac Rhythm:  [-] A-V Sequential paced (05/15 0400) Resp:  [9-22] 12 (05/15 0730) BP: (94-122)/(39-81) 100/66 mmHg (05/15 0800) SpO2:  [98 %-100 %] 100 % (05/15 0730) Arterial Line BP: (62-186)/(44-94) 145/89 mmHg (05/15 0730) FiO2 (%):  [40 %] 40 % (05/15 0400) Weight:  [99.4 kg (219 lb 2.2 oz)] 99.4 kg (219 lb 2.2 oz) (05/15 0545)  Hemodynamic parameters for last 24 hours: PAP: (15-39)/(7-35) 30/21 mmHg CVP:  [1 mmHg-17 mmHg] 12 mmHg CO:  [4.7 L/min-5.4 L/min] 4.9 L/min CI:  [2.3 L/min/m2-2.6 L/min/m2] 2.4 L/min/m2  Intake/Output from previous day: 05/14 0701 - 05/15 0700 In: 4598 [I.V.:3269; Blood:335; NG/GT:335; IV Piggyback:500] Out: 1025 [Urine:8325; Emesis/NG output:300; Chest Tube:210] Intake/Output this shift: Total I/O In: 7.6 [Other:7.6] Out: 350 [Urine:350]  General appearance: sedated on vent. Heart: regular rate and rhythm, S1, S2 normal, no murmur, click, rub or gallop Lungs: clear to auscultation bilaterally Abdomen: few bowel sounds,  soft, nontender Extremities: anasarca Wound: dressings dry  Lab Results:  Recent Labs  05/25/14 0400  05/26/14 0320 05/26/14 0345  WBC 19.7*  --  19.0*  --   HGB 8.8*  < > 8.9* 9.9*  HCT 25.5*  < > 26.3* 29.0*  PLT 101*  --  91*  --   < > = values in this interval not displayed. BMET:  Recent Labs  05/25/14 0400  05/26/14 0320 05/26/14 0345  NA 121*  < > 126* 128*  K 3.6  < > 3.4* 3.3*  CL 91*  < > 94* 92*  CO2 23  --  24  --   GLUCOSE 115*  < > 93 99  BUN 17  < > 16 18  CREATININE 1.21  < > 1.36* 1.40*  CALCIUM 7.6*  --  7.7*  --   < > = values in this interval not displayed.  PT/INR:  Recent Labs  05/24/14 0346  LABPROT 18.1*  INR 1.49   ABG    Component Value Date/Time   PHART 7.491* 05/26/2014 0333   HCO3 28.1* 05/26/2014 0333   TCO2 23 05/26/2014 0345   ACIDBASEDEF 0.0 05/25/2014 0310   O2SAT 99.0 05/26/2014 0333   CBG (last 3)   Recent Labs  05/26/14 0459 05/26/14 0554 05/26/14 0631  GLUCAP 80 114* 122*   CLINICAL DATA: Heart failure as a complication of care.  EXAM: PORTABLE CHEST - 1 VIEW  COMPARISON: 05/25/2014  FINDINGS: Postoperative changes in the mediastinum. Skin clips in the right axilla. Endotracheal tube, enteric tube, right Swan-Ganz catheter and central venous catheter, left chest tube, and  mediastinal drains are unchanged in position. Cardiac enlargement. Mild pulmonary vascular congestion. Perihilar edema is improved since prior study. No pneumothorax. No significant effusions.  IMPRESSION: Appliances appear in satisfactory and unchanged location. Cardiac enlargement with mild pulmonary vascular congestion. Edema is improving.   Electronically Signed  By: Lucienne Capers M.D.  On: 05/26/2014 04:06       Assessment/Plan: S/P Procedure(s) (LRB): CORONARY ARTERY BYPASS GRAFTING (CABG) x  four, using left internal mammary artery and right leg greater saphenous vein harvested endoscopically  (N/A) PLACEMENT OF IMPELLA 5.0 LEFT VENTRICULAR ASSIST DEVICE (N/A) TRANSESOPHAGEAL ECHOCARDIOGRAM (TEE) (N/A)  1. CV: stable on current drips. Titrate levophed as BP allows. Impella  working well and purge flows better with heparin. Will start peripheral heparin today since chest tube output remains low.  2. Resp: Remains on vent. Not ready to wean until pulmonary edema resolves but CXR looks better and diuresing well.   3. Renal: creat up slightly. Lasix increased to 8 to remove volume and received one dose of Tolvaptan for hypervolemic hyponatremia. Excellent diuresis and Na+ improving. His albumin is only 2.2 which will exacerbate his peripheral edema. Replacing K+ and Mg2+.  4. GI: NPO. Will need to start nutrition soon but will hold off on starting tube feeds to see how he does over the next 24 hrs. He could be ready to wean on vent by tomorrow if he continues to diurese well.  5. Neuro: stable.    LOS: 12 days    Gaye Pollack 05/26/2014

## 2014-05-26 NOTE — Progress Notes (Signed)
Spoke with Dr. Ron Parker and advised him that the pt had been shocked a third time for sustained vtach at 1439.  Will continue to monitor.

## 2014-05-26 NOTE — Consult Note (Addendum)
ELECTROPHYSIOLOGY CONSULT NOTE  Patient ID: Jeffrey [name], MRN: [id number], DOB/AGE: [birth year] 74 y.o. Admit date: 06/06/2014 Date of Consult: 05/26/2014  Primary Physician: Kirk Ruths, MD Primary Cardiologist: DB  Chief Complaint: VT   HPI Jeffrey [name] is a 74 y.o. male   underwent high risk bypass surgery supported by [facility]  for severe three-vessel disease  and significant LV dysfunction (EF 20%) with precending NSTEMI  PA catheter demonstrates cardiogenic shock and he was treated with milrinone complicated by significant increase in ventricular ectopy. He was then treated with dobutamine.    postoperative course has been complicated by frequent ventricular tachycardia with 3 episodes in the last 2 hours requiring cardioversion. Ventricular tachycardia is monomorphic. Initial conversations included the use of lidocaine presumed to be ischemic myocardium. In the wake of revascularization it was thought prudent to discontinue this as his metabolism is slowed in congestive heart failure and monitoring drug levels is a challenge. Last p.m. he really see a bolus of lidocaine with increased. Her single episode of VT requiring shock therapy; follow his discontinuation is noted there've been 3 episodes the last hour or 2.   he remains on significant hemodynamic support including Levophed, dopamine, and milrinone   He has been aggressively diuresed over the last 48 hours ; potassium and magnesium have been relatively stable   Past Medical History  Diagnosis Date  . Type 2 diabetes mellitus   . Hypothyroidism   . Hyperlipidemia   . Essential hypertension   . Insomnia   . Depression   . Atrial fibrillation     Diagnosed May 2016      Surgical History:  Past Surgical History  Procedure Laterality Date  . Appendectomy    . Shoulder surgery    . Colonoscopy  04/30/2011    Procedure: COLONOSCOPY;  Surgeon: West Bali, MD;  Location: [facility] ENDO SUITE;  Service: Endoscopy;   Laterality: N/A;  8:30 AM  . Cardiac catheterization N/A 05/19/2014    Procedure: Right/Left Heart Cath and Coronary Angiography;  Surgeon: Tonny Bollman, MD;  Location: [facility] INVASIVE CV LAB CUPID;  Service: Cardiovascular;  Laterality: N/A;  . Cardiac catheterization N/A 05/13/2014    Procedure: IABP Insertion;  Surgeon: Tonny Bollman, MD;  Location: [facility] INVASIVE CV LAB CUPID;  Service: Cardiovascular;  Laterality: N/A;  . Right heart catheterization N/A 05/31/2014    Procedure: Right Heart Cath Additional;  Surgeon: Dolores Patty, MD;  Location: [facility] INVASIVE CV LAB CUPID;  Service: Cardiovascular;  Laterality: N/A;  . Coronary artery bypass graft N/A 05/16/2014    Procedure: CORONARY ARTERY BYPASS GRAFTING (CABG) x  four, using left internal mammary artery and right leg greater saphenous vein harvested endoscopically;  Surgeon: Kerin Perna, MD;  Location: [facility] OR;  Service: Open Heart Surgery;  Laterality: N/A;  . Placement of impella left ventricular assist device N/A 06/04/2014    Procedure: PLACEMENT OF IMPELLA 5.0 LEFT VENTRICULAR ASSIST DEVICE;  Surgeon: Kerin Perna, MD;  Location: [facility] OR;  Service: Open Heart Surgery;  Laterality: N/A;  . Tee without cardioversion N/A 05/30/2014    Procedure: TRANSESOPHAGEAL ECHOCARDIOGRAM (TEE);  Surgeon: Kerin Perna, MD;  Location: [facility] OR;  Service: Open Heart Surgery;  Laterality: N/A;     Home Meds: Prior to Admission medications   Medication Sig Start Date End Date Taking? Authorizing Provider  aspirin EC 81 MG tablet Take 81 mg by mouth daily.   Yes Historical Provider, MD  atorvastatin (LIPITOR) 20 MG  tablet Take 1 tablet by mouth at bedtime.  04/01/14  Yes Historical Provider, MD  fish oil-omega-3 fatty acids 1000 MG capsule Take 1,200 mg by mouth daily.   Yes Historical Provider, MD  glipiZIDE (GLUCOTROL) 5 MG tablet Take 1 tablet by mouth 2 (two) times daily. 05/02/14  Yes Historical Provider, MD  levothyroxine (SYNTHROID, LEVOTHROID) 50 MCG  tablet Take 1 tablet by mouth daily. 03/01/14  Yes Historical Provider, MD  lisinopril (PRINIVIL,ZESTRIL) 20 MG tablet Take 20 mg by mouth at bedtime.    Yes Historical Provider, MD  metFORMIN (GLUCOPHAGE) 500 MG tablet Take 2 tablets by mouth 2 (two) times daily. 05/03/14  Yes Historical Provider, MD  Multiple Vitamin (MULTIVITAMIN) tablet Take 1 tablet by mouth daily.   Yes Historical Provider, MD  zolpidem (AMBIEN) 10 MG tablet Take 10 mg by mouth at bedtime as needed. For sleep    Yes Historical Provider, MD  levothyroxine (SYNTHROID, LEVOTHROID) 25 MCG tablet Take 50 mcg by mouth daily.     Historical Provider, MD    Inpatient Medications:  . sodium chloride   Intravenous Once  . acetaminophen  1,000 mg Oral 4 times per day   Or  . acetaminophen (TYLENOL) oral liquid 160 mg/5 mL  1,000 mg Per Tube 4 times per day  . antiseptic oral rinse  7 mL Mouth Rinse QID  . aspirin EC  325 mg Oral Daily   Or  . aspirin  324 mg Per Tube Daily  . bisacodyl  10 mg Oral Daily   Or  . bisacodyl  10 mg Rectal Daily  . chlorhexidine  15 mL Mouth Rinse BID  . docusate sodium  200 mg Oral Daily  . insulin regular  0-10 Units Intravenous TID WC  . lidocaine  100 mg Intravenous Once  . magnesium sulfate      . pantoprazole (PROTONIX) IV  40 mg Intravenous Q24H  . potassium chloride  10 mEq Intravenous Q1 Hr x 3  . potassium chloride      . potassium chloride      . sodium chloride  3 mL Intravenous Q12H   thinking so this is his   Allergies:  Allergies  Allergen Reactions  . Ambien [Zolpidem Tartrate] Other (See Comments)    Agitated delirium secondary to Ambien administration on 05/21/14  . Penicillins Rash    History   Social History  . Marital Status: Single    Spouse Name: N/A  . Number of Children: N/A  . Years of Education: N/A   Occupational History  . Not on file.   Social History Main Topics  . Smoking status: Former Smoker -- 0.50 packs/day    Types: Cigarettes    Quit  date: 08/18/1990  . Smokeless tobacco: Never Used  . Alcohol Use: No  . Drug Use: No  . Sexual Activity: No   Other Topics Concern  . Not on file   Social History Narrative     Family History  Problem Relation Age of Onset  . Cancer Sister   . Colon cancer Neg Hx      ROS:  Please see the history of present illness.   Sedated and not applicable  All other systems reviewed and negative.    Physical Exam:   Blood pressure 81/66, pulse 38, temperature 98.1 F (36.7 C), temperature source Core (Comment), resp. rate 12, height $RemoveBe'5\' 9"'WEzkjkKVC$  (1.753 m), weight 219 lb 2.2 oz (99.4 kg), SpO2 100 %. General: Well developed, well nourished  male  intubated and sedated Head: Normocephalic, atraumatic, sclera non-icteric, no xanthomas, nares are without discharge. EENT: normal Back: not examined Neck: Negative for carotid bruits.   Lungs: Clear bilaterally to auscultation without wheezes, rales, or rhonchi. Breathing is unlabored. Heart:  irregularly irregular rhythm with a in palate home  Abdomen: Soft, non-tender, non-distended with normoactive bowel sounds. No hepatomegaly. No rebound/guarding. No obvious abdominal masses. Msk:  Strength and tone appear normal for age. Extremities: No clubbing or cyanosis.1+ edema.  Distal pedal pulses are 2+ and equal bilaterally. Skin: Warm and Dry Neuro: As above      Labs: Cardiac Enzymes No results for input(s): CKTOTAL, CKMB, TROPONINI in the last 72 hours. CBC Lab Results  Component Value Date   WBC 19.0* 05/26/2014   HGB 9.9* 05/26/2014   HCT 29.0* 05/26/2014   MCV 76.5* 05/26/2014   PLT 91* 05/26/2014   PROTIME:  Recent Labs  06/02/2014 1724 05/24/14 0346  LABPROT 19.7* 18.1*  INR 1.65* 1.49   Chemistry  Recent Labs Lab 05/26/14 0320  05/26/14 1400  NA 126*  < > 130*  K 3.4*  < > 3.7  CL 94*  < > 94*  CO2 24  --   --   BUN 16  < > 17  CREATININE 1.36*  < > 1.40*  CALCIUM 7.7*  --   --   PROT 4.6*  --   --   BILITOT 1.3*   --   --   ALKPHOS 75  --   --   ALT 27  --   --   AST 28  --   --   GLUCOSE 93  < > 125*  < > = values in this interval not displayed. Lipids Lab Results  Component Value Date   CHOL 100 05/17/2014   HDL 35* 05/17/2014   LDLCALC 55 05/17/2014   TRIG 50 05/17/2014   BNP No results found for: PROBNP Thyroid Function Tests: No results for input(s): TSH, T4TOTAL, T3FREE, THYROIDAB in the last 72 hours.  Invalid input(s): FREET3    Miscellaneous No results found for: DDIMER  Radiology/Studies:  Dg Chest 2 View  06/03/2014   CLINICAL DATA:  Worsening shortness of breath, nonproductive cough  EXAM: CHEST  2 VIEW  COMPARISON:  None.  FINDINGS: Lungs are clear.  No pleural effusion or pneumothorax.  Cardiomegaly.  Degenerative changes of the visualized thoracolumbar spine.  IMPRESSION: No evidence of acute cardiopulmonary disease.   Electronically Signed   By: Julian Hy M.D.   On: 05/16/2014 16:45   Ct Angio Chest Pe W/cm &/or Wo Cm  05/15/2014   CLINICAL DATA:  Shortness of breath for 4-5 days. Increased fatigue. Atrial fibrillation.  EXAM: CT ANGIOGRAPHY CHEST WITH CONTRAST  TECHNIQUE: Multidetector CT imaging of the chest was performed using the standard protocol during bolus administration of intravenous contrast. Multiplanar CT image reconstructions and MIPs were obtained to evaluate the vascular anatomy.  CONTRAST:  130mL OMNIPAQUE IOHEXOL 350 MG/ML SOLN  COMPARISON:  Chest radiographs 06/10/2014  FINDINGS: No lobar or more central pulmonary embolus is identified. Segmental and subsegmental branches are suboptimally evaluated to varying degrees due to motion artifact and suboptimal contrast opacification.  Subcentimeter mediastinal lymph nodes are likely reactive. Three-vessel coronary artery calcification is present. There is a 2 cm focus of irregular density in the lumen of the left ventricle, with a band of motion artifact extending through this level. There is reflux of contrast  into the IVC and hepatic veins  which could be related to injection technique or a component of right heart failure.  There is a moderate right pleural effusion. Evaluation of the lung parenchyma is limited by respiratory motion artifact. Compressive atelectasis is present in the right lower lobe. Mild perihilar ground-glass opacities versus motion artifact.  A small amount of free fluid is partially visualized in the upper abdomen. There is body wall edema. Bridging osteophytosis is present throughout the thoracic spine.  Review of the MIP images confirms the above findings.  IMPRESSION: 1. No evidence of central pulmonary emboli. Suboptimal evaluation of segmental and subsegmental branches due to motion artifact. 2. Evidence of generalized volume overload with moderate right pleural effusion, small volume upper abdominal ascites, and body wall edema. 3. Suboptimal evaluation of the lung parenchyma due to motion artifact. Very mild pulmonary edema not excluded. 4. Small focus of irregular density in the left ventricular lumen. This may be artifactual given motion through this region or represent a small amount of IV contrast. Correlate with pending echocardiogram to exclude thrombus or other lesion.   Electronically Signed   By: Logan Bores   On: 05/15/2014 11:43   Dg Chest Port 1 View  05/26/2014   CLINICAL DATA:  Heart failure as a complication of care.  EXAM: PORTABLE CHEST - 1 VIEW  COMPARISON:  05/25/2014  FINDINGS: Postoperative changes in the mediastinum. Skin clips in the right axilla. Endotracheal tube, enteric tube, right Swan-Ganz catheter and central venous catheter, left chest tube, and mediastinal drains are unchanged in position. Cardiac enlargement. Mild pulmonary vascular congestion. Perihilar edema is improved since prior study. No pneumothorax. No significant effusions.  IMPRESSION: Appliances appear in satisfactory and unchanged location. Cardiac enlargement with mild pulmonary vascular  congestion. Edema is improving.   Electronically Signed   By: Lucienne Capers M.D.   On: 05/26/2014 04:06   Dg Chest Port 1 View  05/25/2014   CLINICAL DATA:  Postop from CABG surgery.  EXAM: PORTABLE CHEST - 1 VIEW  COMPARISON:  05/24/2014  FINDINGS: Irregular interstitial opacities and hazy airspace opacity in the lungs is similar to the previous day's study. There is additional lung base opacity, right greater than left, likely small effusions. No gross pneumothorax on this supine exam.  No mediastinal widening.  Endotracheal tube, right internal jugular Swan-Ganz catheter, nasogastric tube, right PICC and left chest tube are stable, and well-positioned.  IMPRESSION: 1. No change from the previous day's study. 2. Support apparatus is well positioned. 3. Persistent pulmonary edema and small pleural effusions.   Electronically Signed   By: Lajean Manes M.D.   On: 05/25/2014 08:05   Dg Chest Port 1 View  05/24/2014   CLINICAL DATA:  Post CABG.  LVAD/yesterday.  Bleeding.  EXAM: PORTABLE CHEST - 1 VIEW  COMPARISON:  Radiograph 05/24/2014 at 632 hours  FINDINGS: Endotracheal and NGT and unchanged. Swan-Ganz catheter with tip main pulmonary artery. LVAD device from a right subclavian approach is unchanged. Right PICC line unchanged. Mediastinal drain noted. Left chest tube in place without pneumothorax.  Cardiac silhouette is stable compared to prior. There is mild diffuse airspace disease within the left and right lung which is unchanged. Bilateral pleural effusions are noted. No pneumothorax.  IMPRESSION: 1. Stable support apparatus. 2. Stable mild pulmonary edema. 3. No change in pleural effusions. 4. No pneumothorax.   Electronically Signed   By: Suzy Bouchard M.D.   On: 05/24/2014 14:36   Dg Chest Port 1 View  05/24/2014   CLINICAL DATA:  CABG and placement of left ventricular assist device yesterday.  EXAM: PORTABLE CHEST - 1 VIEW  COMPARISON:  One-view chest 05/29/2014  FINDINGS: Median sternotomy  wires are noted. A left ventricular assist device is stable in position. The endotracheal tube terminates 3 cm above the carina. A Swan-Ganz catheter enters via a right IJ sheath and terminates in the main pulmonary outflow tract. Mediastinal drain and left sided pleural drains are stable. There is no pneumothorax. Bilateral pleural effusions and edema are relatively stable. Lung volumes are slightly improved.  IMPRESSION: 1. Status post CABG and placement of left ventricular assist device. 2. Support apparatus is stable. 3. Persistent edema and bilateral pleural effusions. 4. Overall lung volumes are slightly improved.   Electronically Signed   By: San Morelle M.D.   On: 05/24/2014 07:54   Dg Chest Port 1 View  06/09/2014   CLINICAL DATA:  Coronary artery disease.  Status post CABG.  EXAM: PORTABLE CHEST - 1 VIEW  COMPARISON:  Chest x-ray dated 05/21/2014  FINDINGS: Endotracheal tube, Swan-Ganz catheter, NG tube, chest tubes, and Impella left ventricular assist device are in place.  There is slight bilateral pulmonary edema. Pulmonary vascularity is normal. No pneumothorax.  IMPRESSION: Tubes and lines appear in good position. No pneumothorax. Slight pulmonary edema.   Electronically Signed   By: Lorriane Shire M.D.   On: 05/31/2014 16:55   Dg Chest Port 1 View  05/21/2014   CLINICAL DATA:  Recent aortic balloon pump adjustment  EXAM: PORTABLE CHEST - 1 VIEW  COMPARISON:  05/20/2014  FINDINGS: Right-sided PICC line is again identified and stable. The intra-aortic balloon pump is again identified overlying the left mainstem bronchus. Cardiac shadow remains enlarged. The lungs are hypoinflated without focal confluent infiltrate. Crowding of the vascular markings is noted.  IMPRESSION: Tubes and lines as described.  Poor inspiratory effort with vascular crowding.   Electronically Signed   By: Inez Catalina M.D.   On: 05/21/2014 13:27   Dg Chest Port 1 View  05/20/2014   CLINICAL DATA:  PICC insertion.   Myocardial infarction.  EXAM: PORTABLE CHEST - 1 VIEW 7:18 p.m.  COMPARISON:  05/19/2014 at 9:57 a.m.  FINDINGS: Right PICC is been inserted and the tip is at the level of the carina in good position. Aortic balloon in catheter in place, unchanged. Heart size and pulmonary vascularity are normal. Lungs are clear.  IMPRESSION: PICC tip in good position. Swan-Ganz catheter has been removed. Lungs are now clear.   Electronically Signed   By: Lorriane Shire M.D.   On: 05/20/2014 19:40   Dg Chest Port 1 View  05/19/2014   CLINICAL DATA:  Followup CHF.  EXAM: PORTABLE CHEST - 1 VIEW  COMPARISON:  06/06/2014.  FINDINGS: Enlarged cardiac silhouette with improvement. Mild decrease in prominence of the pulmonary vasculature and interstitial markings. No definite pleural fluid. Right jugular Swan-Ganz catheter tip in a right lower lobe pulmonary artery. Thoracic spine degenerative changes. Bilateral shoulder degenerative changes with superior migration of the humeral heads, compatible with chronic rotator cuff tears.  IMPRESSION: Improving cardiomegaly and changes of congestive heart failure.   Electronically Signed   By: Claudie Revering M.D.   On: 05/19/2014 10:18   Dg Chest Port 1 View  05/28/2014   CLINICAL DATA:  Atrial line placement  EXAM: PORTABLE CHEST - 1 VIEW  COMPARISON:  All chest radiograph 06/03/2014  FINDINGS: Interval placement Swan-Ganz catheter with tip in the right main pulmonary artery. No pneumothorax. Cardiac silhouette is  enlarged. There is mild interstitial edema pattern.  IMPRESSION: 1. Swan-Ganz catheter with tip in the right pulmonary artery. 2. Mild interstitial edema pattern with cardiomegaly. 3. No appreciable pneumothorax .   Electronically Signed   By: Suzy Bouchard M.D.   On: 06/09/2014 19:33   Dg C-arm 1-60 Min-no Report  05/22/2014   CLINICAL DATA: Impella device insertion   C-ARM 1-60 MINUTES  Fluoroscopy was utilized by the requesting physician.  No radiographic  interpretation.      Tel multiple episodes of VT with shocks X 3 this am  Monomorphic Also underlying afib and aberation and RVR   Assessment and Plan:  VT  ICM s/p CABg supported by [facility]  AFib  Carotid stenosis   The patient has had frequent and recurrent problems with ventricular tachycardia. When I discussed this with Dr. Reine Just earlier, it had been my hope that revascularization would have dealt with some of the ventricular arrhythmia that can be associated with ischemia and was to be attenuated by lidocaine. Following revascularization, chronically ischemic myocardium to become arrhythmogenic to be: There could also be persistently ischemic regions and the fact that he has done so much worse off of lidocaine over the last 12 hours suggests that this may be the case. We worry about lidocaine toxicity particularly in a patient with heart failure and hepatic congestion. However, his ventricular arrhythmic status is quite tenuous and so we will resume it gently and work on getting a level tomorrow morning.   I have met with the family for about 30 minutes describing them my understanding of the ventricular tachycardia both in terms of its mechanisms related to ischemic cardiomyopathy, electromechanical interactions and treatment options i.e. changing vasoactive agents and are limited our armamentarium of antiarrhythmic therapy. I suggested that the last 12 hours has been very difficult course that my hope would be that the next 12 hours are much less unstable.   I have been touch with Dr. Haroldine Laws.   We will #1 discontinue dopamine #2 resume lidocaine #3 attempt to get lidocaine level in the morning #4 aggressive re-bolusing of amiodarone #5 discontinue milrinone and begin dobutamine 2.5  I should note also that the family has concerns about futility of care. Apparently their mom died during multisystem failure and drugs being used to counteract the effects of other drugs problems. I have reiterated for  them, but the current course is "indeed like a roller coaster" but that is to be expected. Course may change and the trajectory may become more apparent and this may inform ongoing decisions of aggressiveness and continuation of care  Time spent [place]

## 2014-05-26 NOTE — Progress Notes (Signed)
Patient ID: Jeffrey [name], male   DOB: 29-Apr-1940, 74 y.o.   MRN: [id number]  SICU Evening Rounds:  He has been unstable today with recurrent VT and hypotension requiring multiple defibs. He was seen by Dr. Caryl Comes during these episodes and lidocaine restarted, amio rebolused. Dop and Milrinone stopped and put on dobutamine. With all of these changes levophed is now up to 30 mcg to maintain BP. He continues to have excellent urine output on lasix drip.  Co-ox 77%, K 3.7 and repleated. Creat 1.4 stable. ABG ok with no acidosis.  Impella functioning ok.

## 2014-05-26 NOTE — Progress Notes (Signed)
Advanced Heart Failure Rounding Note   Subjective:    Underwent CABG and Impella 5.0 placement on 5/12  Remains intubated and sedated. 40% FiO2. Vasopressin now off. On dopa, milrinone and levophed.  Flow ~4.5 on Impella. Cardiac output 5.3 L/min on Swan.  PA pressures and outputs look good.  Got tolvaptan yesterday and lasix gtt increased to 8/hr. Weight down 13 pounds! CVP 12-13  Recurrent VT this am and got shocked.  Remains on amio and lido. K and Mag being replaced   Objective:    Vital Signs:   Temp:  [97.7 F (36.5 C)-99.7 F (37.6 C)] 98.1 F (36.7 C) (05/15 0730) Pulse Rate:  [29-101] 91 (05/15 0730) Resp:  [9-22] 12 (05/15 0730) BP: (94-122)/(39-81) 107/58 mmHg (05/14 1500) SpO2:  [98 %-100 %] 100 % (05/15 0730) Arterial Line BP: (62-186)/(44-94) 145/89 mmHg (05/15 0730) FiO2 (%):  [40 %] 40 % (05/15 0400) Weight:  [99.4 kg (219 lb 2.2 oz)] 99.4 kg (219 lb 2.2 oz) (05/15 0545) Last BM Date: 05/22/14  Weight change: Filed Weights   05/24/14 0500 05/25/14 0300 05/26/14 0545  Weight: 103.057 kg (227 lb 3.2 oz) 105.4 kg (232 lb 5.8 oz) 99.4 kg (219 lb 2.2 oz)    Intake/Output:   Intake/Output Summary (Last 24 hours) at 05/26/14 0738 Last data filed at 05/26/14 0730  Gross per 24 hour  Intake   4598 ml  Output   9185 ml  Net  -4587 ml     Physical Exam: General: Intubated sedated HEENT: normal Neck: supple. RIJ swan. No lymphadenopathy or thryomegaly appreciated. Cor: R subclavian Impella sheath. Sternal dressing Regular rhythm, controlled rate. impella hum Lungs: clear Abdomen: soft, nontender, minimally distended. No hepatosplenomegaly. No bruits or masses. hypoactive bowel sounds. Extremities: no cyanosis, clubbing, rash, 1+ edema.  Neuro: intubated sedated  Telemetry: NSR and AV pacing  with frequent polymorphic PVCs . Run of VT this am  Labs: Basic Metabolic Panel:  Recent Labs Lab 05/22/14 1907 06/03/2014 0130  06/04/2014 2221 05/24/14 0346   05/24/14 1600 05/25/14 0400 05/25/14 0500 05/25/14 1017 05/25/14 2056 05/26/14 0320 05/26/14 0326 05/26/14 0345  NA 121* 123*  < >  --  124*  < >  --  121*  --  123* 126* 126*  --  128*  K 4.5 4.5  < >  --  4.7  < >  --  3.6  --  4.1 3.5 3.4*  --  3.3*  CL 88* 91*  < >  --  95*  < >  --  91*  --  89* 90* 94*  --  92*  CO2 23 24  --   --  23  --   --  23  --   --   --  24  --   --   GLUCOSE 299* 150*  < >  --  119*  < >  --  115*  --  129* 109* 93  --  99  BUN 17 16  < >  --  15  < >  --  17  --  19 17 16   --  18  CREATININE 1.20 1.14  < >  --  1.10  < > 1.26* 1.21  --  1.30* 1.40* 1.36*  --  1.40*  CALCIUM 8.1* 8.3*  --   --  7.5*  --   --  7.6*  --   --   --  7.7*  --   --  MG  --  1.8  --  1.7 2.8*  --  2.2  --  1.8  --   --   --  1.7  --   < > = values in this interval not displayed.  Liver Function Tests:  Recent Labs Lab 05/22/14 1907 05/25/14 0400 05/26/14 0320  AST 25 37 28  ALT 23 29 27   ALKPHOS 105 60 75  BILITOT 0.8 1.4* 1.3*  PROT 6.0* 4.3* 4.6*  ALBUMIN 2.7* 2.2* 2.3*   CBC:  Recent Labs Lab 06/03/2014 1724  05/18/2014 2220 05/24/14 0346  05/25/14 0400 05/25/14 1017 05/25/14 2056 05/26/14 0320 05/26/14 0345  WBC 14.6*  --  13.9* 11.9*  --  19.7*  --   --  19.0*  --   HGB 8.4*  < > 6.8* 7.4*  < > 8.8* 10.2* 8.8* 8.9* 9.9*  HCT 25.5*  < > 21.1* 22.1*  < > 25.5* 30.0* 26.0* 26.3* 29.0*  MCV 76.3*  --  75.9* 75.9*  --  75.9*  --   --  76.5*  --   PLT REPEATED TO VERIFY  --  110* 90*  --  101*  --   --  91*  --   < > = values in this interval not displayed.  Cardiac Enzymes: No results for input(s): CKTOTAL, CKMB, CKMBINDEX, TROPONINI in the last 168 hours.  BNP: BNP (last 3 results)  Recent Labs  06/05/2014 1630  BNP 885.0*    ProBNP (last 3 results) No results for input(s): PROBNP in the last 8760 hours.    Other results:  Imaging: Dg Chest Port 1 View  05/26/2014   CLINICAL DATA:  Heart failure as a complication of care.  EXAM: PORTABLE  CHEST - 1 VIEW  COMPARISON:  05/25/2014  FINDINGS: Postoperative changes in the mediastinum. Skin clips in the right axilla. Endotracheal tube, enteric tube, right Swan-Ganz catheter and central venous catheter, left chest tube, and mediastinal drains are unchanged in position. Cardiac enlargement. Mild pulmonary vascular congestion. Perihilar edema is improved since prior study. No pneumothorax. No significant effusions.  IMPRESSION: Appliances appear in satisfactory and unchanged location. Cardiac enlargement with mild pulmonary vascular congestion. Edema is improving.   Electronically Signed   By: Lucienne Capers M.D.   On: 05/26/2014 04:06   Dg Chest Port 1 View  05/25/2014   CLINICAL DATA:  Postop from CABG surgery.  EXAM: PORTABLE CHEST - 1 VIEW  COMPARISON:  05/24/2014  FINDINGS: Irregular interstitial opacities and hazy airspace opacity in the lungs is similar to the previous day's study. There is additional lung base opacity, right greater than left, likely small effusions. No gross pneumothorax on this supine exam.  No mediastinal widening.  Endotracheal tube, right internal jugular Swan-Ganz catheter, nasogastric tube, right PICC and left chest tube are stable, and well-positioned.  IMPRESSION: 1. No change from the previous day's study. 2. Support apparatus is well positioned. 3. Persistent pulmonary edema and small pleural effusions.   Electronically Signed   By: Lajean Manes M.D.   On: 05/25/2014 08:05   Dg Chest Port 1 View  05/24/2014   CLINICAL DATA:  Post CABG.  LVAD/yesterday.  Bleeding.  EXAM: PORTABLE CHEST - 1 VIEW  COMPARISON:  Radiograph 05/24/2014 at 632 hours  FINDINGS: Endotracheal and NGT and unchanged. Swan-Ganz catheter with tip main pulmonary artery. LVAD device from a right subclavian approach is unchanged. Right PICC line unchanged. Mediastinal drain noted. Left chest tube in place without pneumothorax.  Cardiac silhouette  is stable compared to prior. There is mild diffuse  airspace disease within the left and right lung which is unchanged. Bilateral pleural effusions are noted. No pneumothorax.  IMPRESSION: 1. Stable support apparatus. 2. Stable mild pulmonary edema. 3. No change in pleural effusions. 4. No pneumothorax.   Electronically Signed   By: Suzy Bouchard M.D.   On: 05/24/2014 14:36     Medications:     Scheduled Medications: . sodium chloride   Intravenous Once  . acetaminophen  1,000 mg Oral 4 times per day   Or  . acetaminophen (TYLENOL) oral liquid 160 mg/5 mL  1,000 mg Per Tube 4 times per day  . antiseptic oral rinse  7 mL Mouth Rinse QID  . aspirin EC  325 mg Oral Daily   Or  . aspirin  324 mg Per Tube Daily  . bisacodyl  10 mg Oral Daily   Or  . bisacodyl  10 mg Rectal Daily  . chlorhexidine  15 mL Mouth Rinse BID  . docusate sodium  200 mg Oral Daily  . insulin regular  0-10 Units Intravenous TID WC  . lidocaine  100 mg Intravenous Once  . magnesium sulfate      . metoprolol tartrate  12.5 mg Oral BID   Or  . metoprolol tartrate  12.5 mg Per Tube BID  . pantoprazole sodium  40 mg Per Tube Daily  . potassium chloride  10 mEq Intravenous Q1 Hr x 5  . potassium chloride      . sodium chloride  3 mL Intravenous Q12H    Infusions: . sodium chloride 10 mL/hr at 05/26/14 0000  . sodium chloride    . sodium chloride 10 mL/hr at 05/26/14 0000  . amiodarone 30 mg/hr (05/24/2014 2200)  . amiodarone 30 mg/hr (05/26/14 0400)  . dexmedetomidine 0.7 mcg/kg/hr (05/26/14 0400)  . dextrose 5 % Impella 5.0 Purge solution    . DOPamine 5 mcg/kg/min (05/26/14 0523)  . furosemide (LASIX) infusion 8 mg/hr (05/26/14 0515)  . impella catheter heparin 50 unit/mL in dextrose 5%    . insulin (NOVOLIN-R) infusion 0.6 Units/hr (05/26/14 0700)  . lactated ringers Stopped (05/24/14 0400)  . lactated ringers 20 mL/hr at 05/26/14 0000  . lidocaine 2 mg/min (05/26/14 2440)  . midazolam (VERSED) infusion 2 mg/hr (05/26/14 0400)  . milrinone 0.3  mcg/kg/min (05/26/14 0400)  . nitroGLYCERIN 0 mcg/min (05/18/2014 1700)  . norepinephrine (LEVOPHED) Adult infusion 7 mcg/min (05/26/14 0644)  . phenylephrine (NEO-SYNEPHRINE) Adult infusion 0 mcg/min (06/03/2014 1700)  . vasopressin (PITRESSIN) infusion - *FOR SHOCK* Stopped (05/25/14 0415)    PRN Medications: sodium chloride, levalbuterol, metoprolol, morphine injection, ondansetron (ZOFRAN) IV, oxyCODONE, sodium chloride, traMADol   Assessment:   1. Cardiogenic shock 2. Ventricular fibrillation arrest 5/6 in setting of milrinone use 3. 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 3v CAD/NSTEMI: Occluded RCA, subtotally occluded prox LAD, 90% LCx 5. DM2 6. Atrial fibrillation, newly diagnosed on admission -> NSR due to defib 5/10 7. Microcytic anemia/thrombocytopenia  8. Acute kidney injury, improving 9. 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 Thrombocytopenia 11. Acute delirium 12. Hyponatremia  Plan/Discussion:    S/p  CABG with Impella 5.0 support 05/17/2014  Impella waveforms look good. Flow on pump 4.5  Hemodynamically stable but still with significant ventricular ectopy. I discussed with EP. Suspect scar-mediated VT. Stop lido due to risk of toxicity. Increase amio. May need ablation  at some point, if possible. . Can consider ranexa when taking pos. Continue to supp K and Mag.   Diuresed briskly after tolvaptan and increasing lasix gtt. Continue gtt as still volume overloaded. Wean vent and pressors as tolerated.   The patient is critically ill with multiple organ systems failure and requires high complexity decision making for assessment and support, frequent evaluation and titration of therapies, application of advanced monitoring technologies and extensive interpretation of multiple databases.   Critical Care Time devoted to patient care services described in this note is 35  Minutes.   Retaj Hilbun,MD 7:38 AM

## 2014-05-26 NOTE — Progress Notes (Signed)
Pt was shocked once for sustained vatch.  This was followed by an amiodarone bolus.

## 2014-05-26 NOTE — Progress Notes (Signed)
Dr,. Ron Parker had called back and advised to administer 150 mg bolus of amio for last episode of vtach at 1439.  He would like to be notified next time pt requires a shock.

## 2014-05-26 NOTE — Progress Notes (Signed)
RN Cyril Mourning gave chaplain brief summary of patient's status and asked chaplain to check in on family following a very difficult day and difficult several days for the patient.  Chaplain made initial contact with several family members in waiting room, who were fairly closed/defensive. Chaplain explained our role to check in on families/patients to assess needs. Oriented family to support services. They stated they did not need anything.  They also said that the staff was being very supportive. I met another brother outside the patient's room who was somewhat open.  Family may be more responsive to the floor chaplain who is doing routine assessments and they don't feel that the chaplain was sent specifically to them.  Chaplain appreciated RN Kristen's care and concern of family/patient. [name] [phone]    05/26/14 2100  Clinical Encounter Type  Visited With Family  Visit Type Initial  Referral From Nurse  Consult/Referral To Chaplain  Stress Factors  Family Stress Factors Lack of knowledge;Major life changes;Exhausted

## 2014-05-27 ENCOUNTER — Inpatient Hospital Stay (HOSPITAL_COMMUNITY): Payer: Commercial Managed Care - HMO

## 2014-05-27 DIAGNOSIS — I480 Paroxysmal atrial fibrillation: Secondary | ICD-10-CM

## 2014-05-27 LAB — CARBOXYHEMOGLOBIN
Carboxyhemoglobin: 1 % (ref 0.5–1.5)
Methemoglobin: 1.2 % (ref 0.0–1.5)
O2 Saturation: 65.2 %
Total hemoglobin: 10 g/dL — ABNORMAL LOW (ref 13.5–18.0)

## 2014-05-27 LAB — GLUCOSE, CAPILLARY
GLUCOSE-CAPILLARY: 101 mg/dL — AB (ref 65–99)
GLUCOSE-CAPILLARY: 109 mg/dL — AB (ref 65–99)
GLUCOSE-CAPILLARY: 111 mg/dL — AB (ref 65–99)
GLUCOSE-CAPILLARY: 112 mg/dL — AB (ref 65–99)
GLUCOSE-CAPILLARY: 115 mg/dL — AB (ref 65–99)
GLUCOSE-CAPILLARY: 123 mg/dL — AB (ref 65–99)
GLUCOSE-CAPILLARY: 126 mg/dL — AB (ref 65–99)
GLUCOSE-CAPILLARY: 150 mg/dL — AB (ref 65–99)
GLUCOSE-CAPILLARY: 171 mg/dL — AB (ref 65–99)
GLUCOSE-CAPILLARY: 98 mg/dL (ref 65–99)
Glucose-Capillary: 105 mg/dL — ABNORMAL HIGH (ref 65–99)
Glucose-Capillary: 105 mg/dL — ABNORMAL HIGH (ref 65–99)
Glucose-Capillary: 125 mg/dL — ABNORMAL HIGH (ref 65–99)
Glucose-Capillary: 136 mg/dL — ABNORMAL HIGH (ref 65–99)
Glucose-Capillary: 140 mg/dL — ABNORMAL HIGH (ref 65–99)
Glucose-Capillary: 143 mg/dL — ABNORMAL HIGH (ref 65–99)
Glucose-Capillary: 168 mg/dL — ABNORMAL HIGH (ref 65–99)
Glucose-Capillary: 173 mg/dL — ABNORMAL HIGH (ref 65–99)
Glucose-Capillary: 91 mg/dL (ref 65–99)

## 2014-05-27 LAB — COMPREHENSIVE METABOLIC PANEL
ALT: 23 U/L (ref 17–63)
AST: 21 U/L (ref 15–41)
Albumin: 2.1 g/dL — ABNORMAL LOW (ref 3.5–5.0)
Alkaline Phosphatase: 83 U/L (ref 38–126)
Anion gap: 7 (ref 5–15)
BUN: 15 mg/dL (ref 6–20)
CO2: 26 mmol/L (ref 22–32)
Calcium: 7.5 mg/dL — ABNORMAL LOW (ref 8.9–10.3)
Chloride: 96 mmol/L — ABNORMAL LOW (ref 101–111)
Creatinine, Ser: 1.3 mg/dL — ABNORMAL HIGH (ref 0.61–1.24)
GFR calc Af Amer: >60 mL/min (ref >60)
GFR calc non Af Amer: 54 mL/min — ABNORMAL LOW (ref >60)
Glucose, Bld: 106 mg/dL — ABNORMAL HIGH (ref 65–99)
Potassium: 3.6 mmol/L (ref 3.5–5.1)
Sodium: 129 mmol/L — ABNORMAL LOW (ref 135–145)
Total Bilirubin: 1.1 mg/dL (ref 0.3–1.2)
Total Protein: 4.4 g/dL — ABNORMAL LOW (ref 6.5–8.1)

## 2014-05-27 LAB — POCT I-STAT, CHEM 8
BUN: 16 mg/dL (ref 6–20)
CREATININE: 1.3 mg/dL — AB (ref 0.61–1.24)
Calcium, Ion: 1.12 mmol/L — ABNORMAL LOW (ref 1.13–1.30)
Chloride: 93 mmol/L — ABNORMAL LOW (ref 101–111)
Glucose, Bld: 177 mg/dL — ABNORMAL HIGH (ref 65–99)
HEMATOCRIT: 29 % — AB (ref 39.0–52.0)
Hemoglobin: 9.9 g/dL — ABNORMAL LOW (ref 13.0–17.0)
Potassium: 3.9 mmol/L (ref 3.5–5.1)
SODIUM: 129 mmol/L — AB (ref 135–145)
TCO2: 22 mmol/L (ref 0–100)

## 2014-05-27 LAB — POCT ACTIVATED CLOTTING TIME
ACTIVATED CLOTTING TIME: 128 s
ACTIVATED CLOTTING TIME: 134 s
ACTIVATED CLOTTING TIME: 159 s
ACTIVATED CLOTTING TIME: 165 s
ACTIVATED CLOTTING TIME: 171 s
ACTIVATED CLOTTING TIME: 171 s
ACTIVATED CLOTTING TIME: 177 s
ACTIVATED CLOTTING TIME: 177 s
Activated Clotting Time: 122 seconds
Activated Clotting Time: 128 seconds
Activated Clotting Time: 134 seconds
Activated Clotting Time: 147 seconds
Activated Clotting Time: 159 seconds
Activated Clotting Time: 165 seconds
Activated Clotting Time: 171 seconds
Activated Clotting Time: 177 seconds
Activated Clotting Time: 177 seconds
Activated Clotting Time: 177 seconds
Activated Clotting Time: 177 seconds
Activated Clotting Time: 177 seconds
Activated Clotting Time: 177 seconds
Activated Clotting Time: 177 seconds
Activated Clotting Time: 178 seconds
Activated Clotting Time: 183 seconds

## 2014-05-27 LAB — POCT I-STAT 3, ART BLOOD GAS (G3+)
Acid-Base Excess: 4 mmol/L — ABNORMAL HIGH (ref 0.0–2.0)
Bicarbonate: 27.6 mEq/L — ABNORMAL HIGH (ref 20.0–24.0)
O2 SAT: 100 %
TCO2: 29 mmol/L (ref 0–100)
pCO2 arterial: 35.7 mmHg (ref 35.0–45.0)
pH, Arterial: 7.496 — ABNORMAL HIGH (ref 7.350–7.450)
pO2, Arterial: 166 mmHg — ABNORMAL HIGH (ref 80.0–100.0)

## 2014-05-27 LAB — CBC
HCT: 29.7 % — ABNORMAL LOW (ref 39.0–52.0)
Hemoglobin: 10 g/dL — ABNORMAL LOW (ref 13.0–17.0)
MCH: 26 pg (ref 26.0–34.0)
MCHC: 33.7 g/dL (ref 30.0–36.0)
MCV: 77.3 fL — ABNORMAL LOW (ref 78.0–100.0)
Platelets: 77 10*3/uL — ABNORMAL LOW (ref 150–400)
RBC: 3.84 MIL/uL — ABNORMAL LOW (ref 4.22–5.81)
RDW: 17.1 % — ABNORMAL HIGH (ref 11.5–15.5)
WBC: 18.2 10*3/uL — ABNORMAL HIGH (ref 4.0–10.5)

## 2014-05-27 LAB — LACTATE DEHYDROGENASE: LDH: 356 U/L — ABNORMAL HIGH (ref 98–192)

## 2014-05-27 LAB — APTT: aPTT: 183 seconds — ABNORMAL HIGH (ref 24–37)

## 2014-05-27 LAB — MAGNESIUM: MAGNESIUM: 1.7 mg/dL (ref 1.7–2.4)

## 2014-05-27 MED ORDER — MAGNESIUM SULFATE 4 GM/100ML IV SOLN
4.0000 g | Freq: Once | INTRAVENOUS | Status: AC
  Administered 2014-05-27: 4 g via INTRAVENOUS
  Filled 2014-05-27: qty 100

## 2014-05-27 MED ORDER — FUROSEMIDE 10 MG/ML IJ SOLN
5.0000 mg/h | INTRAVENOUS | Status: DC
  Filled 2014-05-27: qty 25

## 2014-05-27 MED ORDER — INSULIN ASPART 100 UNIT/ML ~~LOC~~ SOLN
0.0000 [IU] | SUBCUTANEOUS | Status: DC
  Administered 2014-05-27 (×2): 4 [IU] via SUBCUTANEOUS
  Administered 2014-05-27: 2 [IU] via SUBCUTANEOUS
  Administered 2014-05-28: 4 [IU] via SUBCUTANEOUS
  Administered 2014-05-28: 2 [IU] via SUBCUTANEOUS
  Administered 2014-05-28: 8 [IU] via SUBCUTANEOUS
  Administered 2014-05-28: 4 [IU] via SUBCUTANEOUS

## 2014-05-27 MED ORDER — INSULIN DETEMIR 100 UNIT/ML ~~LOC~~ SOLN
10.0000 [IU] | Freq: Two times a day (BID) | SUBCUTANEOUS | Status: DC
  Administered 2014-05-27 – 2014-05-28 (×3): 10 [IU] via SUBCUTANEOUS
  Filled 2014-05-27 (×4): qty 0.1

## 2014-05-27 MED ORDER — ALBUMIN HUMAN 5 % IV SOLN
12.5000 g | Freq: Once | INTRAVENOUS | Status: AC
  Administered 2014-05-27: 12.5 g via INTRAVENOUS
  Filled 2014-05-27: qty 250

## 2014-05-27 MED ORDER — DEXTROSE 5 % IV SOLN
1.0000 g | Freq: Three times a day (TID) | INTRAVENOUS | Status: DC
  Administered 2014-05-27 – 2014-05-29 (×8): 1 g via INTRAVENOUS
  Filled 2014-05-27 (×10): qty 1

## 2014-05-27 MED ORDER — INSULIN ASPART 100 UNIT/ML ~~LOC~~ SOLN: 0.0000 [IU] | SUBCUTANEOUS | Status: DC

## 2014-05-27 MED ORDER — CEFTAZIDIME 1 G IJ SOLR
1.0000 g | Freq: Three times a day (TID) | INTRAMUSCULAR | Status: DC
  Filled 2014-05-27 (×4): qty 1

## 2014-05-27 MED ORDER — AMIODARONE LOAD VIA INFUSION
150.0000 mg | Freq: Once | INTRAVENOUS | Status: AC
  Administered 2014-05-27: 150 mg via INTRAVENOUS
  Filled 2014-05-27: qty 83.34

## 2014-05-27 MED ORDER — POTASSIUM CHLORIDE 10 MEQ/50ML IV SOLN
10.0000 meq | INTRAVENOUS | Status: AC
  Administered 2014-05-27 (×4): 10 meq via INTRAVENOUS
  Filled 2014-05-27 (×4): qty 50

## 2014-05-27 MED ORDER — VITAL 1.5 CAL PO LIQD
1000.0000 mL | ORAL | Status: DC
  Administered 2014-05-27: 1000 mL
  Filled 2014-05-27 (×2): qty 1000

## 2014-05-27 NOTE — Progress Notes (Signed)
Patient Name: Jeffrey [name]      SUBJECTIVE:intbuated and nonresponsive  Past Medical History  Diagnosis Date  . Type 2 diabetes mellitus   . Hypothyroidism   . Hyperlipidemia   . Essential hypertension   . Insomnia   . Depression   . Atrial fibrillation     Diagnosed May 2016    Scheduled Meds:  Scheduled Meds: . sodium chloride   Intravenous Once  . acetaminophen  1,000 mg Oral 4 times per day   Or  . acetaminophen (TYLENOL) oral liquid 160 mg/5 mL  1,000 mg Per Tube 4 times per day  . antiseptic oral rinse  7 mL Mouth Rinse QID  . aspirin EC  325 mg Oral Daily   Or  . aspirin  324 mg Per Tube Daily  . bisacodyl  10 mg Oral Daily   Or  . bisacodyl  10 mg Rectal Daily  . chlorhexidine  15 mL Mouth Rinse BID  . docusate sodium  200 mg Oral Daily  . insulin regular  0-10 Units Intravenous TID WC  . pantoprazole (PROTONIX) IV  40 mg Intravenous Q24H  . sodium chloride  3 mL Intravenous Q12H   Continuous Infusions: . sodium chloride Stopped (05/27/14 0120)  . sodium chloride    . sodium chloride 20 mL/hr at 05/27/14 0400  . amiodarone 30 mg/hr (06/06/2014 2200)  . amiodarone 60 mg/hr (05/27/14 0400)  . dexmedetomidine 1 mcg/kg/hr (05/27/14 0434)  . dextrose 5 % Impella 5.0 Purge solution    . DOBUTamine 2.5 mcg/kg/min (05/27/14 0400)  . furosemide (LASIX) infusion 8 mg/hr (05/27/14 0400)  . impella catheter heparin 50 unit/mL in dextrose 5%    . heparin 1,200 Units/hr (05/27/14 0655)  . insulin (NOVOLIN-R) infusion 0.4 Units/hr (05/27/14 0700)  . lactated ringers Stopped (05/24/14 0400)  . lactated ringers 10 mL/hr at 05/27/14 0400  . lidocaine 1 mg/min (05/27/14 0400)  . midazolam (VERSED) infusion 4 mg/hr (05/27/14 0432)  . milrinone Stopped (05/26/14 1900)  . nitroGLYCERIN 0 mcg/min (06/08/2014 1700)  . norepinephrine (LEVOPHED) Adult infusion 17 mcg/min (05/27/14 0415)  . phenylephrine (NEO-SYNEPHRINE) Adult infusion 0 mcg/min (05/20/2014 1700)    sodium chloride, levalbuterol, morphine injection, ondansetron (ZOFRAN) IV, oxyCODONE, sodium chloride, traMADol    PHYSICAL EXAM Filed Vitals:   05/27/14 0630 05/27/14 0645 05/27/14 0700 05/27/14 0715  BP:      Pulse:      Temp: 98.4 F (36.9 C) 98.4 F (36.9 C) 98.4 F (36.9 C) 98.6 F (37 C)  TempSrc:      Resp: 15 14 13 15   Height:      Weight:      SpO2:       Well developed and nourished intubated HENT normal Neck supple   Clear lat Regular rate and rhythm, impella hum Abd-soft with active BS No Clubbing cyanosis tr edema Skin-warm and dry     TELEMETRY: Reviewed telemetry pt in no sustained VT overnight   Intake/Output Summary (Last 24 hours) at 05/27/14 0809 Last data filed at 05/27/14 0700  Gross per 24 hour  Intake 4937.75 ml  Output   7170 ml  Net -2232.25 ml    LABS: Basic Metabolic Panel:  Recent Labs Lab 05/22/14 0427 05/22/14 1907 05/31/2014 0130  05/24/14 0346  05/25/14 0400  05/25/14 1017 05/25/14 2056 05/26/14 0320  05/26/14 0345 05/26/14 1330 05/26/14 1400 05/26/14 2013 05/27/14 0252  NA 129* 121* 123*  < > 124*  < >  121*  --  123* 126* 126*  --  128*  --  130* 131* 129*  K 4.6 4.5 4.5  < > 4.7  < > 3.6  --  4.1 3.5 3.4*  --  3.3* 3.7 3.7 3.7 3.6  CL 97* 88* 91*  < > 95*  < > 91*  --  89* 90* 94*  --  92*  --  94* 94* 96*  CO2 24 23 24   --  23  --  23  --   --   --  24  --   --   --   --   --  26  GLUCOSE 145* 299* 150*  < > 119*  < > 115*  --  129* 109* 93  --  99  --  125* 135* 106*  BUN 15 17 16   < > 15  < > 17  --  19 17 16   --  18  --  17 16 15   CREATININE 1.11 1.20 1.14  < > 1.10  < > 1.21  --  1.30* 1.40* 1.36*  --  1.40*  --  1.40* 1.30* 1.30*  CALCIUM 8.6* 8.1* 8.3*  --  7.5*  --  7.6*  --   --   --  7.7*  --   --   --   --   --  7.5*  MG  --   --  1.8  < > 2.8*  < >  --   < >  --   --   --   < >  --  2.2  --   --  1.7  < > = values in this interval not displayed. Cardiac Enzymes: No results for input(s): CKTOTAL,  CKMB, CKMBINDEX, TROPONINI in the last 72 hours. CBC:  Recent Labs Lab 05/12/2014 0430  06/07/2014 1348  05/26/2014 1724  06/07/2014 2220 05/24/14 0346  05/25/14 0400 05/25/14 1017 05/25/14 2056 05/26/14 0320 05/26/14 0345 05/26/14 1400 05/26/14 2013 05/27/14 0252  WBC 11.5*  --   --   --  14.6*  --  13.9* 11.9*  --  19.7*  --   --  19.0*  --   --   --  18.2*  HGB 10.9*  < > 7.4*  < > 8.4*  < > 6.8* 7.4*  < > 8.8* 10.2* 8.8* 8.9* 9.9* 9.9* 10.9* 10.0*  HCT 33.4*  < > 23.5*  < > 25.5*  < > 21.1* 22.1*  < > 25.5* 30.0* 26.0* 26.3* 29.0* 29.0* 32.0* 29.7*  MCV 74.2*  --   --   --  76.3*  --  75.9* 75.9*  --  75.9*  --   --  76.5*  --   --   --  77.3*  PLT 88*  --  60*  --  REPEATED TO VERIFY  --  110* 90*  --  101*  --   --  91*  --   --   --  77*  < > = values in this interval not displayed. PROTIME: No results for input(s): LABPROT, INR in the last 72 hours. Liver Function Tests:  Recent Labs  05/26/14 0320 05/27/14 0252  AST 28 21  ALT 27 23  ALKPHOS 75 83  BILITOT 1.3* 1.1  PROT 4.6* 4.4*  ALBUMIN 2.3* 2.1*   No results for input(s): LIPASE, AMYLASE in the last 72 hours. BNP: BNP (last 3 results)  Recent Labs  05/15/2014 1630  BNP 885.0*  ProBNP (last 3 results)    ASSESSMENT AND PLAN:  Ventricular Tachy Ischemic CM Impella   Pts rhythm much quieter overnight, not sure why but certainly grateful, perhaps indeed, the vasoactive agents less proarrhtyhmic Will need lido level today    Signed, Virl Axe MD  05/27/2014

## 2014-05-27 NOTE — Progress Notes (Signed)
ANTICOAGULATION CONSULT NOTE - Initial Consult  Pharmacy Consult for heparin Indication: Impella 5.0  Allergies  Allergen Reactions  . Ambien [Zolpidem Tartrate] Other (See Comments)    Agitated delirium secondary to Ambien administration on 05/21/14  . Penicillins Rash    Patient Measurements: Height: 5\' 9"  (175.3 cm) Weight: 216 lb 7.9 oz (98.2 kg) IBW/kg (Calculated) : 70.7 Heparin Dosing Weight: 88kg  Vital Signs: Temp: 99.7 F (37.6 C) (05/16 1330) Temp Source: Core (Comment) (05/16 1330) BP: 73/62 mmHg (05/16 0757) Pulse Rate: 74 (05/16 1330)  Labs:  Recent Labs  05/25/14 0400  05/26/14 0320  05/26/14 1400 05/26/14 2013 05/27/14 0252  HGB 8.8*  < > 8.9*  < > 9.9* 10.9* 10.0*  HCT 25.5*  < > 26.3*  < > 29.0* 32.0* 29.7*  PLT 101*  --  91*  --   --   --  77*  APTT 32  --  44*  --   --   --  183*  CREATININE 1.21  < > 1.36*  < > 1.40* 1.30* 1.30*  < > = values in this interval not displayed.  Estimated Creatinine Clearance: 60.2 mL/min (by C-G formula based on Cr of 1.3).   Medical History: Past Medical History  Diagnosis Date  . Type 2 diabetes mellitus   . Hypothyroidism   . Hyperlipidemia   . Essential hypertension   . Insomnia   . Depression   . Atrial fibrillation     Diagnosed May 2016    Assessment: 74 yo male s/p CABG on 5/12 and on Impella 5.0. He is on heparin through the purge at 9.5 ml/hr (475 units/hr of heparin) and also on systemic heparin at 1200 units/hr (total heparin = 1675 units/hr)   -hourly ACT trend:171>>177  No bleeding complications noted per nursing. Hgb down slightly to 10, pltc low at 77.  Goal of Therapy:  ACT= 160-180 Monitor platelets by anticoagulation protocol: Yes   Plan:  -Maintain heparin at 1200 units/hr (Heparin rate range of 1000-1500 units/hr seems appropriate) -Hourly ACTs -Will follow patient progress  Erin Hearing PharmD., BCPS Clinical Pharmacist Pager [phone] 05/27/2014 2:54 PM

## 2014-05-27 NOTE — Progress Notes (Signed)
Pt having runs of A Fib. VSS.  MD paged. Order received for 150 mg Amio Bolus IV. MD requests RN to repage if bursts continue. Will carry out orders and continue to monitor pt closely.

## 2014-05-27 NOTE — Progress Notes (Signed)
Utilization review completed.  

## 2014-05-27 NOTE — Progress Notes (Signed)
4 Days Post-Op Procedure(s) (LRB): CORONARY ARTERY BYPASS GRAFTING (CABG) x  four, using left internal mammary artery and right leg greater saphenous vein harvested endoscopically (N/A) PLACEMENT OF IMPELLA 5.0 LEFT VENTRICULAR ASSIST DEVICE (N/A) TRANSESOPHAGEAL ECHOCARDIOGRAM (TEE) (N/A) Subjective: Patient had a stable night without significant VT requiring shock Chest x-ray significant with improved after aggressive diuresis hemodynamics stable on norepinephrine and dobutamine Continuing IV lidocaine and amiodarone for arrhythmias Currently DDD pacing Patient with agitation and requires restraint-will not wean sedatives and try to extubate because of patient's underlying agitation. He does respond to command and moves all extremities. Patient required nutrition so we'll place Panda tube and start vital tube feedings Continue heparin sliding scale for temporary LVAD Plan on weaning LVAD in 48 hours and removing in 72 hours  Objective: Vital signs in last 24 hours: Temp:  [95.5 F (35.3 C)-99 F (37.2 C)] 99 F (37.2 C) (05/16 0800) Pulse Rate:  [30-105] 90 (05/16 0757) Cardiac Rhythm:  [-] A-V Sequential paced (05/16 0800) Resp:  [12-25] 25 (05/16 0800) BP: (62-100)/(30-69) 73/62 mmHg (05/16 0757) SpO2:  [97 %-100 %] 100 % (05/16 0800) Arterial Line BP: (61-125)/(48-86) 64/58 mmHg (05/16 0800) FiO2 (%):  [40 %] 40 % (05/16 0800) Weight:  [216 lb 7.9 oz (98.2 kg)] 216 lb 7.9 oz (98.2 kg) (05/16 0400)  Hemodynamic parameters for last 24 hours: PAP: (14-32)/(11-25) 19/16 mmHg CVP:  [4 mmHg-12 mmHg] 8 mmHg CO:  [3 L/min-4.2 L/min] 3 L/min CI:  [1.5 L/min/m2-2 L/min/m2] 1.5 L/min/m2  Intake/Output from previous day: 05/15 0701 - 05/16 0700 In: 5243.3 [I.V.:3975.5; NG/GT:120; IV Piggyback:950] Out: 7740 [Urine:7420; Emesis/NG output:150; Chest Tube:170] Intake/Output this shift:    Lungs clear Extremities warm Minimal edema  Lab Results:  Recent Labs  05/26/14 0320   05/26/14 2013 05/27/14 0252  WBC 19.0*  --   --  18.2*  HGB 8.9*  < > 10.9* 10.0*  HCT 26.3*  < > 32.0* 29.7*  PLT 91*  --   --  77*  < > = values in this interval not displayed. BMET:  Recent Labs  05/26/14 0320  05/26/14 2013 05/27/14 0252  NA 126*  < > 131* 129*  K 3.4*  < > 3.7 3.6  CL 94*  < > 94* 96*  CO2 24  --   --  26  GLUCOSE 93  < > 135* 106*  BUN 16  < > 16 15  CREATININE 1.36*  < > 1.30* 1.30*  CALCIUM 7.7*  --   --  7.5*  < > = values in this interval not displayed.  PT/INR: No results for input(s): LABPROT, INR in the last 72 hours. ABG    Component Value Date/Time   PHART 7.496* 05/27/2014 0254   HCO3 27.6* 05/27/2014 0254   TCO2 29 05/27/2014 0254   ACIDBASEDEF 0.0 05/25/2014 0310   O2SAT 100.0 05/27/2014 0254   CBG (last 3)   Recent Labs  05/27/14 0147 05/27/14 0246 05/27/14 0452  GLUCAP 109* 101* 115*    Assessment/Plan: S/P Procedure(s) (LRB): CORONARY ARTERY BYPASS GRAFTING (CABG) x  four, using left internal mammary artery and right leg greater saphenous vein harvested endoscopically (N/A) PLACEMENT OF IMPELLA 5.0 LEFT VENTRICULAR ASSIST DEVICE (N/A) TRANSESOPHAGEAL ECHOCARDIOGRAM (TEE) (N/A) Continue sedation and ventilation Start tube feedings Continue IV lidocaine and amiodarone Continue antibiotics for possible aspiration pneumonia   LOS: 13 days    Tharon Aquas Trigt III 05/27/2014

## 2014-05-27 NOTE — Progress Notes (Signed)
Pt observed to have nosebleed draining into pt's mouth and onto the pillow. Pt's mouth suctioned and bright red blood with clots were removed. Left radial Art Line also noted to be bleeding at this time after its removal this morning.  MD made aware of new findings. Orders received to hold Heparin drip for 4 hours and resume at that time if bleeding has stopped.  Will continue to monitor pt closely.

## 2014-05-27 NOTE — Progress Notes (Signed)
      [address]       [place] [address]             [phone]      Intubated, sedated  BP 103/64 mmHg  Pulse 28  Temp(Src) 100.4 F (38 C) (Core (Comment))  Resp 18  Ht 5\' 9"  (1.753 m)  Wt 216 lb 7.9 oz (98.2 kg)  BMI 31.96 kg/m2  SpO2 100%   Intake/Output Summary (Last 24 hours) at 05/27/14 1817 Last data filed at 05/27/14 1700  Gross per 24 hour  Intake 4838.37 ml  Output   4850 ml  Net -11.63 ml    Has had a fib- amiodarone bolus, dobutamine decreased  No VT today  Nose bleed since about 3 PM, heparin on hold  Jarmal Lewelling C. Roxan Hockey, MD [facility] [phone]

## 2014-05-27 NOTE — Progress Notes (Signed)
Advanced Heart Failure Rounding Note   Subjective:    Underwent CABG and Impella 5.0 placement on 5/12  Remains intubated and sedated. 40% FiO2. Vasopressin now off. On dopa, milrinone and levophed.  Flow ~4.5 on Impella. Cardiac output 5.3 L/min on Swan.  PA pressures and outputs look good.  Yesterday in VT with 5 shocks. Overnight had NSVT. Re-started on lidocaine drip and continued on amiodarone drip 60 mg. Remains on norepi 17 mcg , dobutamine 2.5 mcg, and lasix drip 5 mg per hour. Brisk diuresis Plans to return to OR on Jens Som Numbers this am PAP 19/16 CVP 4 CO/CI 3/ 1.5   Act 177 CO-OX 65%   Objective:    Vital Signs:   Temp:  [95.5 F (35.3 C)-99.5 F (37.5 C)] 99.5 F (37.5 C) (05/16 0930) Pulse Rate:  [30-105] 90 (05/16 0757) Resp:  [12-25] 15 (05/16 0930) BP: (62-94)/(30-69) 73/62 mmHg (05/16 0757) SpO2:  [97 %-100 %] 100 % (05/16 0800) Arterial Line BP: (58-125)/(48-86) 69/62 mmHg (05/16 0930) FiO2 (%):  [40 %] 40 % (05/16 0930) Weight:  [216 lb 7.9 oz (98.2 kg)] 216 lb 7.9 oz (98.2 kg) (05/16 0400) Last BM Date: 05/22/14  Weight change: Filed Weights   05/25/14 0300 05/26/14 0545 05/27/14 0400  Weight: 232 lb 5.8 oz (105.4 kg) 219 lb 2.2 oz (99.4 kg) 216 lb 7.9 oz (98.2 kg)    Intake/Output:   Intake/Output Summary (Last 24 hours) at 05/27/14 0941 Last data filed at 05/27/14 0800  Gross per 24 hour  Intake 4850.5 ml  Output   6855 ml  Net -2004.5 ml     Physical Exam: General: Intubated sedated HEENT: normal Neck: supple. RIJ swan. No lymphadenopathy or thryomegaly appreciated. Cor: R subclavian Impella sheath. Sternal dressing Regular rhythm, controlled rate. impella hum Lungs: clear Abdomen: soft, nontender, minimally distended. No hepatosplenomegaly. No bruits or masses. hypoactive bowel sounds. Extremities: no cyanosis, clubbing, rash,  R and LLE SCDs. 1+ edema.  Neuro: intubated sedated  Telemetry: NSR and AV pacing  with frequent  polymorphic PVCs .   Labs: Basic Metabolic Panel:  Recent Labs Lab 06/07/2014 0130  05/24/14 0346  05/24/14 1600 05/25/14 0400 05/25/14 0500  05/26/14 0320 05/26/14 0326 05/26/14 0345 05/26/14 1330 05/26/14 1400 05/26/14 2013 05/27/14 0252  NA 123*  < > 124*  < >  --  121*  --   < > 126*  --  128*  --  130* 131* 129*  K 4.5  < > 4.7  < >  --  3.6  --   < > 3.4*  --  3.3* 3.7 3.7 3.7 3.6  CL 91*  < > 95*  < >  --  91*  --   < > 94*  --  92*  --  94* 94* 96*  CO2 24  --  23  --   --  23  --   --  24  --   --   --   --   --  26  GLUCOSE 150*  < > 119*  < >  --  115*  --   < > 93  --  99  --  125* 135* 106*  BUN 16  < > 15  < >  --  17  --   < > 16  --  18  --  17 16 15   CREATININE 1.14  < > 1.10  < > 1.26* 1.21  --   < >  1.36*  --  1.40*  --  1.40* 1.30* 1.30*  CALCIUM 8.3*  --  7.5*  --   --  7.6*  --   --  7.7*  --   --   --   --   --  7.5*  MG 1.8  < > 2.8*  --  2.2  --  1.8  --   --  1.7  --  2.2  --   --  1.7  < > = values in this interval not displayed.  Liver Function Tests:  Recent Labs Lab 05/22/14 1907 05/25/14 0400 05/26/14 0320 05/27/14 0252  AST 25 37 28 21  ALT 23 29 27 23   ALKPHOS 105 60 75 83  BILITOT 0.8 1.4* 1.3* 1.1  PROT 6.0* 4.3* 4.6* 4.4*  ALBUMIN 2.7* 2.2* 2.3* 2.1*   CBC:  Recent Labs Lab 05/24/2014 2220 05/24/14 0346  05/25/14 0400  05/26/14 0320 05/26/14 0345 05/26/14 1400 05/26/14 2013 05/27/14 0252  WBC 13.9* 11.9*  --  19.7*  --  19.0*  --   --   --  18.2*  HGB 6.8* 7.4*  < > 8.8*  < > 8.9* 9.9* 9.9* 10.9* 10.0*  HCT 21.1* 22.1*  < > 25.5*  < > 26.3* 29.0* 29.0* 32.0* 29.7*  MCV 75.9* 75.9*  --  75.9*  --  76.5*  --   --   --  77.3*  PLT 110* 90*  --  101*  --  91*  --   --   --  77*  < > = values in this interval not displayed.  Cardiac Enzymes: No results for input(s): CKTOTAL, CKMB, CKMBINDEX, TROPONINI in the last 168 hours.  BNP: BNP (last 3 results)  Recent Labs  06/11/2014 1630  BNP 885.0*    ProBNP (last 3  results) No results for input(s): PROBNP in the last 8760 hours.    Other results:  Imaging: Dg Chest Port 1 View  05/27/2014   CLINICAL DATA:  Cardiogenic shock. Intubated patient with a chest tube in place.  EXAM: PORTABLE CHEST - 1 VIEW  COMPARISON:  Single view of the chest 05/26/2014 and 05/25/2014.  FINDINGS: Defibrillator pads have been removed. Support apparatus is otherwise unchanged. No pneumothorax is identified with a left chest tube in place. The patient has small to moderate bilateral pleural effusions. Mild interstitial edema continues to improve. Basilar atelectasis is noted. There is cardiomegaly.  IMPRESSION: Support apparatus projects in good position. Negative for pneumothorax with a left chest tube in place.  Continued improvement in mild interstitial edema.  Small to moderate bilateral pleural effusions and basilar atelectasis.   Electronically Signed   By: Inge Rise M.D.   On: 05/27/2014 07:31   Dg Chest Port 1 View  05/26/2014   CLINICAL DATA:  Heart failure as a complication of care.  EXAM: PORTABLE CHEST - 1 VIEW  COMPARISON:  05/25/2014  FINDINGS: Postoperative changes in the mediastinum. Skin clips in the right axilla. Endotracheal tube, enteric tube, right Swan-Ganz catheter and central venous catheter, left chest tube, and mediastinal drains are unchanged in position. Cardiac enlargement. Mild pulmonary vascular congestion. Perihilar edema is improved since prior study. No pneumothorax. No significant effusions.  IMPRESSION: Appliances appear in satisfactory and unchanged location. Cardiac enlargement with mild pulmonary vascular congestion. Edema is improving.   Electronically Signed   By: Lucienne Capers M.D.   On: 05/26/2014 04:06     Medications:     Scheduled Medications: . sodium  chloride   Intravenous Once  . acetaminophen  1,000 mg Oral 4 times per day   Or  . acetaminophen (TYLENOL) oral liquid 160 mg/5 mL  1,000 mg Per Tube 4 times per day  .  albumin human  12.5 g Intravenous Once  . antiseptic oral rinse  7 mL Mouth Rinse QID  . aspirin EC  325 mg Oral Daily   Or  . aspirin  324 mg Per Tube Daily  . bisacodyl  10 mg Oral Daily   Or  . bisacodyl  10 mg Rectal Daily  . cefTAZidime (FORTAZ)  IV  1 g Intravenous Q8H  . chlorhexidine  15 mL Mouth Rinse BID  . docusate sodium  200 mg Oral Daily  . insulin aspart  0-24 Units Subcutaneous 6 times per day  . insulin detemir  10 Units Subcutaneous BID  . insulin regular  0-10 Units Intravenous TID WC  . pantoprazole (PROTONIX) IV  40 mg Intravenous Q24H  . sodium chloride  3 mL Intravenous Q12H    Infusions: . sodium chloride Stopped (05/27/14 0120)  . sodium chloride    . sodium chloride 20 mL/hr at 05/27/14 0400  . amiodarone 30 mg/hr (05/14/2014 2200)  . amiodarone 60 mg/hr (05/27/14 0938)  . dexmedetomidine 1 mcg/kg/hr (05/27/14 0900)  . dextrose 5 % Impella 5.0 Purge solution    . DOBUTamine 2.5 mcg/kg/min (05/27/14 0900)  . feeding supplement (VITAL 1.5 CAL)    . furosemide (LASIX) infusion 5 mg/hr (05/27/14 0900)  . impella catheter heparin 50 unit/mL in dextrose 5%    . heparin 1,200 Units/hr (05/27/14 0900)  . insulin (NOVOLIN-R) infusion 0.6 Units/hr (05/27/14 0800)  . lactated ringers Stopped (05/24/14 0400)  . lactated ringers 10 mL/hr at 05/27/14 0400  . lidocaine 1 mg/min (05/27/14 0800)  . midazolam (VERSED) infusion 4 mg/hr (05/27/14 0800)  . milrinone Stopped (05/26/14 1900)  . norepinephrine (LEVOPHED) Adult infusion 17 mcg/min (05/27/14 0800)  . phenylephrine (NEO-SYNEPHRINE) Adult infusion 0 mcg/min (06/03/2014 1700)    PRN Medications: sodium chloride, levalbuterol, morphine injection, ondansetron (ZOFRAN) IV, oxyCODONE, sodium chloride, traMADol   Assessment:   1. Cardiogenic shock 2. Ventricular fibrillation arrest 5/6 in setting of milrinone use 3. Acute systolic HF with severe biventricular dysfunction EF 20-25% (no LV thrombus) - s/p IABP  placement 5/6 - dobutamine 5/6 4. 3v CAD/NSTEMI: Occluded RCA, subtotally occluded prox LAD, 90% LCx 5. DM2 6. Atrial fibrillation, newly diagnosed on admission -> NSR due to defib 5/10 7. Microcytic anemia/thrombocytopenia  8. Acute kidney injury, improving 9. 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 Thrombocytopenia 11. Acute delirium 12. Hyponatremia 13. NSVT  Plan/Discussion:    S/p  CABG with Impella 5.0 support 06/08/2014  Impella waveforms look good. Flow on pump 4.5.  Remains at P-8 support.    Per EP -Suspect scar-mediated VT. Continue lidocaine for now + amio 60 mg per hour.  K and mag supplemented.   May need ablation at some point, if possible.  Will need eventual wean off lidocaine onto mexiletine or ranolazine.    Brisk diuresis. CVP down to 4. Stop lasix drip. SBP remains low. Continue norepi and dobutamine.    The patient is critically ill with multiple organ systems failure and requires high complexity decision making for assessment and support, frequent evaluation and titration of therapies, application of advanced monitoring technologies and extensive interpretation of multiple databases.   CLEGG,AMY, NP-C  9:41 AM  Patient seen with NP, agree with the above note.  CVP currently 3-4, CI 1.5.  MAP in upper 60s. 2 short runs NSVT overnight, none this morning.  - Stop Lasix gtt with low CVP, will need to resume Lasix eventually, can given IV boluses as needed.  - Would increase dobutamine carefully to 3.5 mcg/kg/min with poor hemodynamics.  - Continues to require norepinephrine. - No weaning of Impella today, continue P-8 support.   Covering with antibiotics for possible aspiration PNA.   Continue amiodarone gtt and lidocaine gtt today, likely wean lido to mexiletine versus Ranexa eventually.   Critical Care Time devoted to patient care services described in this note is 35 Minutes.  Loralie Champagne 05/27/2014 10:40 AM

## 2014-05-27 NOTE — Procedures (Signed)
Asked to place a femoral arterial line due to concerns re function of left radial arterial line. The patient is in cardiogenic shock on vasopressors with mechanical circulatory support and so line was placed emergently.  Given absence of pulsatile flow and current anticoagulated status, I performed a left femoral arterial line insertion with ultrasound guidance.  Anatomic bony landmarks palpated and CFA identified with ultrasound guidance. Area was scrubbed with chorhex solution and sterile drapes placed. Area anesthetized with 1% lidocaine (5cc). Under continuous ultrasound guidance and using the modified seldinger technique, an arterial line was placed with a single anterior wall puncture of the L CFA.   EBL: 5cc  Complications: none  Of note, the femoral A line, like the L radial A line, demonstrated essentially no pulsatility.

## 2014-05-27 NOTE — Progress Notes (Signed)
Pt continues to have bursts of A Fib despite Amio bolus. MD paged. Orders received to reduce Dobutamine to previous dose of 2.36mcg/kg/min.  Order carried out. Will continue to monitor pt closely.

## 2014-05-28 ENCOUNTER — Encounter (HOSPITAL_COMMUNITY): Payer: Self-pay | Admitting: Certified Registered Nurse Anesthetist

## 2014-05-28 ENCOUNTER — Inpatient Hospital Stay (HOSPITAL_COMMUNITY): Payer: Commercial Managed Care - HMO

## 2014-05-28 LAB — COMPREHENSIVE METABOLIC PANEL
ALT: 19 U/L (ref 17–63)
AST: 24 U/L (ref 15–41)
Albumin: 1.7 g/dL — ABNORMAL LOW (ref 3.5–5.0)
Alkaline Phosphatase: 85 U/L (ref 38–126)
Anion gap: 6 (ref 5–15)
BUN: 14 mg/dL (ref 6–20)
CO2: 22 mmol/L (ref 22–32)
Calcium: 6.5 mg/dL — ABNORMAL LOW (ref 8.9–10.3)
Chloride: 104 mmol/L (ref 101–111)
Creatinine, Ser: 1.08 mg/dL (ref 0.61–1.24)
GFR calc Af Amer: >60 mL/min (ref >60)
GFR calc non Af Amer: >60 mL/min (ref >60)
Glucose, Bld: 150 mg/dL — ABNORMAL HIGH (ref 65–99)
Potassium: 3.4 mmol/L — ABNORMAL LOW (ref 3.5–5.1)
Sodium: 132 mmol/L — ABNORMAL LOW (ref 135–145)
Total Bilirubin: 0.7 mg/dL (ref 0.3–1.2)
Total Protein: 3.7 g/dL — ABNORMAL LOW (ref 6.5–8.1)

## 2014-05-28 LAB — POCT I-STAT, CHEM 8
BUN: 16 mg/dL (ref 6–20)
BUN: 17 mg/dL (ref 6–20)
BUN: 18 mg/dL (ref 6–20)
CALCIUM ION: 1.17 mmol/L (ref 1.13–1.30)
CREATININE: 1.3 mg/dL — AB (ref 0.61–1.24)
CREATININE: 1.3 mg/dL — AB (ref 0.61–1.24)
Calcium, Ion: 1.13 mmol/L (ref 1.13–1.30)
Calcium, Ion: 1.17 mmol/L (ref 1.13–1.30)
Chloride: 93 mmol/L — ABNORMAL LOW (ref 101–111)
Chloride: 93 mmol/L — ABNORMAL LOW (ref 101–111)
Chloride: 95 mmol/L — ABNORMAL LOW (ref 101–111)
Creatinine, Ser: 1.3 mg/dL — ABNORMAL HIGH (ref 0.61–1.24)
GLUCOSE: 176 mg/dL — AB (ref 65–99)
Glucose, Bld: 210 mg/dL — ABNORMAL HIGH (ref 65–99)
Glucose, Bld: 193 mg/dL — ABNORMAL HIGH (ref 65–99)
HCT: 28 % — ABNORMAL LOW (ref 39.0–52.0)
HCT: 35 % — ABNORMAL LOW (ref 39.0–52.0)
HCT: 27 % — ABNORMAL LOW (ref 39.0–52.0)
HEMOGLOBIN: 11.9 g/dL — AB (ref 13.0–17.0)
Hemoglobin: 9.5 g/dL — ABNORMAL LOW (ref 13.0–17.0)
Hemoglobin: 9.2 g/dL — ABNORMAL LOW (ref 13.0–17.0)
POTASSIUM: 4.2 mmol/L (ref 3.5–5.1)
Potassium: 3.6 mmol/L (ref 3.5–5.1)
Potassium: 4 mmol/L (ref 3.5–5.1)
SODIUM: 127 mmol/L — AB (ref 135–145)
SODIUM: 127 mmol/L — AB (ref 135–145)
Sodium: 130 mmol/L — ABNORMAL LOW (ref 135–145)
TCO2: 21 mmol/L (ref 0–100)
TCO2: 21 mmol/L (ref 0–100)
TCO2: 22 mmol/L (ref 0–100)

## 2014-05-28 LAB — CBC
HCT: 18.5 % — ABNORMAL LOW (ref 39.0–52.0)
HCT: 25.6 % — ABNORMAL LOW (ref 39.0–52.0)
Hemoglobin: 6.2 g/dL — CL (ref 13.0–17.0)
Hemoglobin: 8.6 g/dL — ABNORMAL LOW (ref 13.0–17.0)
MCH: 26.6 pg (ref 26.0–34.0)
MCH: 26.5 pg (ref 26.0–34.0)
MCHC: 33.5 g/dL (ref 30.0–36.0)
MCHC: 33.6 g/dL (ref 30.0–36.0)
MCV: 79.4 fL (ref 78.0–100.0)
MCV: 78.8 fL (ref 78.0–100.0)
Platelets: 59 10*3/uL — ABNORMAL LOW (ref 150–400)
Platelets: 84 10*3/uL — ABNORMAL LOW (ref 150–400)
RBC: 2.33 MIL/uL — ABNORMAL LOW (ref 4.22–5.81)
RBC: 3.25 MIL/uL — ABNORMAL LOW (ref 4.22–5.81)
RDW: 18.1 % — ABNORMAL HIGH (ref 11.5–15.5)
RDW: 18.4 % — ABNORMAL HIGH (ref 11.5–15.5)
WBC: 12.4 10*3/uL — ABNORMAL HIGH (ref 4.0–10.5)
WBC: 19.2 10*3/uL — ABNORMAL HIGH (ref 4.0–10.5)

## 2014-05-28 LAB — POCT ACTIVATED CLOTTING TIME
ACTIVATED CLOTTING TIME: 165 s
ACTIVATED CLOTTING TIME: 171 s
ACTIVATED CLOTTING TIME: 171 s
Activated Clotting Time: 0 seconds
Activated Clotting Time: 141 seconds
Activated Clotting Time: 159 seconds
Activated Clotting Time: 171 seconds
Activated Clotting Time: 0 seconds
Activated Clotting Time: 171 seconds
Activated Clotting Time: 171 seconds
Activated Clotting Time: 177 seconds

## 2014-05-28 LAB — CARBOXYHEMOGLOBIN
Carboxyhemoglobin: 1.5 % (ref 0.5–1.5)
Methemoglobin: 1.1 % (ref 0.0–1.5)
O2 Saturation: 67.1 %
Total hemoglobin: 8.5 g/dL — ABNORMAL LOW (ref 13.5–18.0)

## 2014-05-28 LAB — GLUCOSE, CAPILLARY
GLUCOSE-CAPILLARY: 138 mg/dL — AB (ref 65–99)
Glucose-Capillary: 202 mg/dL — ABNORMAL HIGH (ref 65–99)
Glucose-Capillary: 216 mg/dL — ABNORMAL HIGH (ref 65–99)
Glucose-Capillary: 184 mg/dL — ABNORMAL HIGH (ref 65–99)
Glucose-Capillary: 181 mg/dL — ABNORMAL HIGH (ref 65–99)
Glucose-Capillary: 211 mg/dL — ABNORMAL HIGH (ref 65–99)

## 2014-05-28 LAB — POCT I-STAT 3, ART BLOOD GAS (G3+)
ACID-BASE EXCESS: 1 mmol/L (ref 0.0–2.0)
Bicarbonate: 24.3 mEq/L — ABNORMAL HIGH (ref 20.0–24.0)
O2 SAT: 100 %
PCO2 ART: 35.1 mmHg (ref 35.0–45.0)
Patient temperature: 37.4
TCO2: 25 mmol/L (ref 0–100)
pH, Arterial: 7.451 — ABNORMAL HIGH (ref 7.350–7.450)
pO2, Arterial: 169 mmHg — ABNORMAL HIGH (ref 80.0–100.0)

## 2014-05-28 LAB — LACTATE DEHYDROGENASE: LDH: 306 U/L — ABNORMAL HIGH (ref 98–192)

## 2014-05-28 LAB — HEMOGLOBIN AND HEMATOCRIT, BLOOD
HCT: 26.2 % — ABNORMAL LOW (ref 39.0–52.0)
Hemoglobin: 8.9 g/dL — ABNORMAL LOW (ref 13.0–17.0)

## 2014-05-28 LAB — APTT: aPTT: >200 seconds (ref 24–37)

## 2014-05-28 LAB — HEPARIN LEVEL (UNFRACTIONATED): Heparin Unfractionated: 0.11 IU/mL — ABNORMAL LOW (ref 0.30–0.70)

## 2014-05-28 MED ORDER — FUROSEMIDE 10 MG/ML IJ SOLN
40.0000 mg | Freq: Once | INTRAMUSCULAR | Status: AC
  Administered 2014-05-28: 40 mg via INTRAVENOUS

## 2014-05-28 MED ORDER — INSULIN ASPART 100 UNIT/ML ~~LOC~~ SOLN
0.0000 [IU] | SUBCUTANEOUS | Status: DC
  Administered 2014-05-28: 4 [IU] via SUBCUTANEOUS
  Administered 2014-05-28: 8 [IU] via SUBCUTANEOUS
  Administered 2014-05-29: 2 [IU] via SUBCUTANEOUS
  Administered 2014-05-29: 8 [IU] via SUBCUTANEOUS
  Administered 2014-05-29 (×3): 4 [IU] via SUBCUTANEOUS
  Administered 2014-05-29: 8 [IU] via SUBCUTANEOUS
  Administered 2014-05-30: 4 [IU] via SUBCUTANEOUS
  Administered 2014-05-30: 12 [IU] via SUBCUTANEOUS
  Administered 2014-05-30 (×2): 2 [IU] via SUBCUTANEOUS
  Administered 2014-05-31: 8 [IU] via SUBCUTANEOUS
  Administered 2014-05-31 – 2014-06-01 (×2): 2 [IU] via SUBCUTANEOUS
  Administered 2014-06-01: 8 [IU] via SUBCUTANEOUS
  Administered 2014-06-01: 4 [IU] via SUBCUTANEOUS
  Administered 2014-06-01: 8 [IU] via SUBCUTANEOUS

## 2014-05-28 MED ORDER — SODIUM CHLORIDE 0.9 % IV SOLN
INTRAVENOUS | Status: DC
  Filled 2014-05-28 (×2): qty 2.5

## 2014-05-28 MED ORDER — INSULIN DETEMIR 100 UNIT/ML ~~LOC~~ SOLN
30.0000 [IU] | Freq: Two times a day (BID) | SUBCUTANEOUS | Status: DC
  Administered 2014-05-28 – 2014-05-29 (×2): 30 [IU] via SUBCUTANEOUS
  Filled 2014-05-28 (×3): qty 0.3

## 2014-05-28 MED ORDER — POTASSIUM CHLORIDE 10 MEQ/50ML IV SOLN
10.0000 meq | INTRAVENOUS | Status: AC
  Administered 2014-05-28 (×3): 10 meq via INTRAVENOUS

## 2014-05-28 MED ORDER — POTASSIUM CHLORIDE 10 MEQ/50ML IV SOLN
10.0000 meq | INTRAVENOUS | Status: AC
  Administered 2014-05-28 (×3): 10 meq via INTRAVENOUS
  Filled 2014-05-28: qty 50

## 2014-05-28 MED ORDER — POTASSIUM CHLORIDE 10 MEQ/50ML IV SOLN
INTRAVENOUS | Status: AC
  Filled 2014-05-28: qty 150

## 2014-05-28 MED ORDER — VITAL 1.5 CAL PO LIQD
1000.0000 mL | ORAL | Status: DC
  Administered 2014-05-28: 1000 mL
  Filled 2014-05-28 (×4): qty 1000

## 2014-05-28 NOTE — Progress Notes (Signed)
SUBJECTIVE: The patient is intubated and sedated.  Significantly decreased ventricular ectopy.  CURRENT MEDICATIONS: . sodium chloride   Intravenous Once  . acetaminophen  1,000 mg Oral 4 times per day   Or  . acetaminophen (TYLENOL) oral liquid 160 mg/5 mL  1,000 mg Per Tube 4 times per day  . antiseptic oral rinse  7 mL Mouth Rinse QID  . aspirin EC  325 mg Oral Daily   Or  . aspirin  324 mg Per Tube Daily  . bisacodyl  10 mg Oral Daily   Or  . bisacodyl  10 mg Rectal Daily  . cefTAZidime (FORTAZ)  IV  1 g Intravenous Q8H  . chlorhexidine  15 mL Mouth Rinse BID  . docusate sodium  200 mg Oral Daily  . furosemide  40 mg Intravenous Once  . insulin aspart  0-24 Units Subcutaneous 6 times per day  . insulin detemir  10 Units Subcutaneous BID  . insulin regular  0-10 Units Intravenous TID WC  . pantoprazole (PROTONIX) IV  40 mg Intravenous Q24H  . sodium chloride  3 mL Intravenous Q12H   . sodium chloride Stopped (05/27/14 0120)  . sodium chloride    . sodium chloride 20 mL (05/27/14 2203)  . amiodarone 30 mg/hr (05/15/2014 2200)  . amiodarone 60 mg/hr (05/28/14 1100)  . dexmedetomidine 1 mcg/kg/hr (05/28/14 1100)  . dextrose 5 % Impella 5.0 Purge solution    . DOBUTamine 2.5 mcg/kg/min (05/28/14 1100)  . feeding supplement (VITAL 1.5 CAL) 1,000 mL (05/28/14 1100)  . impella catheter heparin 50 unit/mL in dextrose 5%    . heparin 1,300 Units/hr (05/28/14 0900)  . insulin (NOVOLIN-R) infusion Stopped (05/27/14 1200)  . lactated ringers Stopped (05/24/14 0400)  . lactated ringers 10 mL/hr at 05/27/14 0400  . lidocaine 1 mg/min (05/28/14 1100)  . midazolam (VERSED) infusion 1 mg/hr (05/28/14 1100)  . milrinone Stopped (05/26/14 1900)  . norepinephrine (LEVOPHED) Adult infusion 19 mcg/min (05/28/14 1100)  . phenylephrine (NEO-SYNEPHRINE) Adult infusion 0 mcg/min (06/10/2014 1700)    OBJECTIVE: Physical Exam: Filed Vitals:   05/28/14 1030 05/28/14 1045 05/28/14 1100  05/28/14 1115  BP:      Pulse:      Temp: 99.7 F (37.6 C) 99.7 F (37.6 C) 99.7 F (37.6 C) 99.7 F (37.6 C)  TempSrc:   Core (Comment)   Resp: 21 20 19 19   Height:      Weight:      SpO2:        Intake/Output Summary (Last 24 hours) at 05/28/14 1131 Last data filed at 05/28/14 1100  Gross per 24 hour  Intake 4570.32 ml  Output   1815 ml  Net 2755.32 ml    Telemetry reveals sinus rhythm with runs of atrial ectopy and frequent PAC's  GEN- The patient is intubated and sedated, ill appearing Head- normocephalic, atraumatic Eyes-  Sclera clear, conjunctiva pink Ears- hearing intact Oropharynx- intubated Neck- supple, R subclavian Impella sheath Lymph- no cervical lymphadenopathy Lungs- Clear to ausculation bilaterally, normal work of breathing Heart- Regular rate and rhythm, no murmurs, rubs or gallops  GI- soft, NT, ND, + BS Extremities- no clubbing, cyanosis, + dependant edema Skin- no rash or lesion   LABS: Basic Metabolic Panel:  Recent Labs  05/26/14 1330  05/27/14 0252  05/28/14 0408 05/28/14 0816  NA  --   < > 129*  < > 132* 127*  K 3.7  < > 3.6  < > 3.4* 4.2  CL  --   < > 96*  < > 104 93*  CO2  --   --  26  --  22  --   GLUCOSE  --   < > 106*  < > 150* 210*  BUN  --   < > 15  < > 14 17  CREATININE  --   < > 1.30*  < > 1.08 1.30*  CALCIUM  --   --  7.5*  --  6.5*  --   MG 2.2  --  1.7  --   --   --   < > = values in this interval not displayed. Liver Function Tests:  Recent Labs  05/27/14 0252 05/28/14 0408  AST 21 24  ALT 23 19  ALKPHOS 83 85  BILITOT 1.1 0.7  PROT 4.4* 3.7*  ALBUMIN 2.1* 1.7*    CBC:  Recent Labs  05/27/14 0252  05/28/14 0408 05/28/14 0518 05/28/14 0816  WBC 18.2*  --  12.4*  --   --   HGB 10.0*  < > 6.2* 8.9* 11.9*  HCT 29.7*  < > 18.5* 26.2* 35.0*  MCV 77.3*  --  79.4  --   --   PLT 77*  --  59*  --   --   < > = values in this interval not displayed.  RADIOLOGY: Dg Chest Port 1 View 05/28/2014   CLINICAL  DATA:  CABG.  EXAM: PORTABLE CHEST - 1 VIEW  COMPARISON:  05/27/2014 .  FINDINGS: Surgical staples right chest. Endotracheal to, Swan-Ganz catheter, mediastinal drainage tube, right PICC line left chest tube in stable position. Prior CABG. Stable cardiomegaly. Pulmonary interstitial edema has continued to clear. Mild basilar atelectasis again noted. No pneumothorax.  IMPRESSION: 1. Lines and tubes in stable position. Left chest tube noted. No pneumothorax. 2. Prior CABG. Continued improvement of bilateral pulmonary interstitial edema. Basilar subsegmental atelectasis .   Electronically Signed   By: Marcello Moores  Register   On: 05/28/2014 07:32   ASSESSMENT AND PLAN:  Active Problems:   NSTEMI (non-ST elevated myocardial infarction)   Atrial fibrillation   Diabetes   Hypertension   Acute systolic CHF (congestive heart failure)   Paroxysmal VT   Hypothyroidism   Microcytic anemia   Hypocalcemia   Atrial fibrillation, unspecified   VF (ventricular fibrillation)   S/P CABG x 4  1.  Ventricular tachycardia Continue Lidocaine and Amiodarone for now Transition to Mexiletine when able Lidocaine level not yet back  2.  Acute on chronic systolic heart failure Per AHF team  3.  CAD s/p CABG Per TCTS   Chanetta Marshall, NP 05/28/2014 11:40 AM  I have seen, examined the patient, and reviewed the above assessment and plan. Very ill appearing. Changes to above are made where necessary.   Ventricular ectopy is stable.  Would continue lidocaine and amiodarone today.  Transition to oral amiodarone and mexiletine when taking POs.  VT likely due to acute medical illness/ ischemia/ cardiogenic shock and is improving with improvement in medical condition.  Would avoid ablation at this time.  Co Sign: Thompson Grayer, MD 05/28/2014 11:43 AM

## 2014-05-28 NOTE — Progress Notes (Addendum)
CRITICAL VALUE ALERT  Critical value received:  PTT > 200   Date of notification:  05/28/14  Time of notification:  0529  Critical value read back: yes  Nurse who received alert:  A. Darcel Smalling RN   MD notified (1st page):  Dr. Roxan Hockey  Time of first page:  0604  Updated Dr. Roxan Hockey on critical lab, gums bleeding, and bright red blood via OGT. At this time will continue to closely monitor. Richarda Blade RN

## 2014-05-28 NOTE — Progress Notes (Addendum)
CRITICAL VALUE ALERT  Critical value received:  Hgb 6.2    Repeat value 8.9   Date of notification:  05/28/14  Time of notification:  0512  Critical value read back: yes   Nurse who received alert:  A. Roderic Lammert RN   Will do stat H&H to confirm lab value.

## 2014-05-28 NOTE — Progress Notes (Signed)
[facility] for heparin Indication: Impella 5.0  Allergies  Allergen Reactions  . Ambien [Zolpidem Tartrate] Other (See Comments)    Agitated delirium secondary to Ambien administration on 05/21/14  . Penicillins Rash    Patient Measurements: Height: 5\' 9"  (175.3 cm) Weight: 218 lb 11.1 oz (99.2 kg) IBW/kg (Calculated) : 70.7 Heparin Dosing Weight: 88kg  Vital Signs: Temp: 99.5 F (37.5 C) (05/17 1415) Temp Source: Core (Comment) (05/17 1400) BP: 79/73 mmHg (05/17 1214) Pulse Rate: 74 (05/17 1214)  Labs:  Recent Labs  05/26/14 0320  05/27/14 0252  05/28/14 0021 05/28/14 0408 05/28/14 0518 05/28/14 0816  HGB 8.9*  < > 10.0*  < > 9.5* 6.2* 8.9* 11.9*  HCT 26.3*  < > 29.7*  < > 28.0* 18.5* 26.2* 35.0*  PLT 91*  --  77*  --   --  59*  --   --   APTT 44*  --  183*  --   --  >200*  --   --   HEPARINUNFRC  --   --   --   --   --  0.11*  --   --   CREATININE 1.36*  < > 1.30*  < > 1.30* 1.08  --  1.30*  < > = values in this interval not displayed.  Estimated Creatinine Clearance: 60.5 mL/min (by C-G formula based on Cr of 1.3).   Medical History: Past Medical History  Diagnosis Date  . Type 2 diabetes mellitus   . Hypothyroidism   . Hyperlipidemia   . Essential hypertension   . Insomnia   . Depression   . Atrial fibrillation     Diagnosed May 2016    Assessment: 74 yo male s/p CABG on 5/12 and on Impella 5.0. He is on heparin through the purge at 9.5 ml/hr (475 units/hr of heparin) and also on systemic heparin at 1200 units/hr (total heparin = 1675 units/hr)   -hourly ACT trend:171, systemic heparin at 1300 units/hr, purge 10.8=540 units/hr = total 1840 units/hr  Epistaxis and Jacqulin Brandenburger bleeding from OG tube noted this am, heparin to continue for now. Hgb was low at 6 but repeats are now back up 8.9>>11.9. Platelet count is down to 59, HIT panel prior to impella negative.  Goal of Therapy:  ACT= 160-180 Monitor platelets by  anticoagulation protocol: Yes   Plan:  -systemic heparin currently at 1300 units/hr (Heparin rate range of 1000-1500 units/hr seems appropriate) -Hourly ACTs -Will follow patient progress/pltc in am  Erin Hearing PharmD., BCPS Clinical Pharmacist Pager [phone] 05/28/2014 2:32 PM

## 2014-05-28 NOTE — Progress Notes (Signed)
Nutrition Follow-up  DOCUMENTATION CODES:  Obesity unspecified  INTERVENTION:  Tube feeding, Prostat   Recommend Vital High Protein formula at goal rate of 40 ml/hr with Prostat liquid protein 30 ml 5 times daily   Total TF regimen to provide 1460 kcals, 159 gm protein, 903 ml of free water  NUTRITION DIAGNOSIS:  Inadequate oral intake related to inability to eat as evidenced by NPO status, ongoing  GOAL:  Provide needs based on ASPEN/SCCM guidelines, currently unmet  MONITOR:  Vent status, Labs, Weight trends, I & O's, Skin  ASSESSMENT: 74 y.o. Male with HTN, DM, HLD admitted with NSTEMI and new AF; s/p cardiac cath 5/6 with severe3-v CAD; had VF arrest requiring DC-CV.   Patient s/p procedure 5/12: CORONARY ARTERY BYPASS GRAFTING (CABG) x 4 PLACEMENT OF IMPELLA 5.0 LEFT VENTRICULAR ASSIST DEVICE  Patient is currently intubated on ventilator support -- OGT in place MV: 10.5 L/min Temp (24hrs), Avg:99.7 F (37.6 C), Min:99 F (37.2 C), Max:100.4 F (38 C)   Vital 1.5 formula initiated today -- currently infusing via small bore feeding tube at 40 ml/hr providing 1440 kcals, 65 gm protein, 733 ml of free water.  Height:  Ht Readings from Last 1 Encounters:  05/18/2014 5\' 9"  (1.753 m)    Weight:  Wt Readings from Last 1 Encounters:  05/28/14 218 lb 11.1 oz (99.2 kg)    Ideal Body Weight:  73 kg  Wt Readings from Last 10 Encounters:  05/28/14 218 lb 11.1 oz (99.2 kg)  04/30/11 192 lb (87.091 kg)  08/18/10 199 lb (90.266 kg)    BMI:  Body mass index is 32.28 kg/(m^2).  Estimated Nutritional Needs:  Kcal:  9826-4158  Protein:  150-160 gm  Fluid:  per MD  Skin:  Wound (see comment) (Stage I to sacrum)  Diet Order:  Diet NPO time specified  EDUCATION NEEDS:  No education needs identified at this time   Intake/Output Summary (Last 24 hours) at 05/28/14 1549 Last data filed at 05/28/14 1500  Gross per 24 hour  Intake 4886.47 ml  Output   2315  ml  Net 2571.47 ml    Last BM:  Unknown   Arthur Holms, RD, LDN Pager #: [phone] After-Hours Pager #: [phone]

## 2014-05-28 NOTE — Progress Notes (Signed)
Patient ID: Jeffrey [name], male   DOB: Jan 23, 1940, 74 y.o.   MRN: [id number]  SICU Evening Rounds:  Impella position adjusted by Dr. Haroldine Laws with TEE guidance today and waveform looks much better. Arterial waveform is now pulsatile. Remains on dobutamine, levophed, amio and lidocaine. No VT today.  Urine output ok with lasix this am.  Still have some dark bloody drainage from subglottic suction. Hgb 8.6, stable from this am.  plts increased a little to 84K.  ACT 170's on heparin.  BMET    Component Value Date/Time   NA 127* 05/28/2014 1547   K 4.0 05/28/2014 1547   CL 95* 05/28/2014 1547   CO2 22 05/28/2014 0408   GLUCOSE 193* 05/28/2014 1547   BUN 18 05/28/2014 1547   CREATININE 1.30* 05/28/2014 1547   CALCIUM 6.5* 05/28/2014 0408   GFRNONAA >60 05/28/2014 0408   GFRAA >60 05/28/2014 0408    I updated family and answered their questions.

## 2014-05-28 NOTE — Progress Notes (Signed)
   Called to see patient over flattened waveform on impella.  Echo guidance used.  Impella catheter was posterior and up against posterior wall coursing over back of anterior mitral valve leaflet. Pump pulled back and the readvanced with turning counterclockwise. I did this several times but was unable to get catheter into midventricle. Although the catheter was still posterior, I was able to find a position where flows looked good and MV moved freely. We rezeroed the waveforms on Impella console.   Time spent: 35 mins  Jamecia Lerman,MD 3:21 PM

## 2014-05-28 NOTE — Progress Notes (Signed)
Advanced Heart Failure Rounding Note   Subjective:    Underwent CABG and Impella 5.0 placement on 5/12  Remains intubated and sedated. 40% FiO2. Dobutamine at 2.5. Levophed at 18. Unable to wean. Had more ectopy when dobutamine turned up to 5. Ectopy now quieter. On lido 1 and amio 60.    PA pressures look good. CVP 8-10. Off lasix.   Impella flow 4.5.  Co-ox 67% Thermodilution numbers inaccurate (we had this problem in 2H as well)  Swan Numbers this am PAP 120/13 CVP 8 CO/CI 3/ 1.5    CO-OX 67%   Objective:    Vital Signs:   Temp:  [99 F (37.2 C)-100.4 F (38 C)] 99.5 F (37.5 C) (05/17 0930) Pulse Rate:  [26-80] 80 (05/17 0724) Resp:  [12-27] 26 (05/17 0930) BP: (75-103)/(64-71) 75/71 mmHg (05/17 0724) SpO2:  [100 %] 100 % (05/17 0900) FiO2 (%):  [40 %] 40 % (05/17 0724) Weight:  [99.2 kg (218 lb 11.1 oz)] 99.2 kg (218 lb 11.1 oz) (05/17 0233) Last BM Date: 05/22/14  Weight change: Filed Weights   05/26/14 0545 05/27/14 0400 05/28/14 0233  Weight: 99.4 kg (219 lb 2.2 oz) 98.2 kg (216 lb 7.9 oz) 99.2 kg (218 lb 11.1 oz)    Intake/Output:   Intake/Output Summary (Last 24 hours) at 05/28/14 1002 Last data filed at 05/28/14 0900  Gross per 24 hour  Intake 4794.74 ml  Output   1960 ml  Net 2834.74 ml     Physical Exam: General: Intubated sedated HEENT: normal Neck: supple. RIJ swan. No lymphadenopathy or thryomegaly appreciated. Cor: R subclavian Impella sheath. Sternal dressing Regular rhythm, controlled rate. impella hum Lungs: clear Abdomen: soft, nontender, minimally distended. No hepatosplenomegaly. No bruits or masses. hypoactive bowel sounds. Extremities: no cyanosis, clubbing, rash,  R and LLE SCDs. 1-2+ edema.  Neuro: intubated sedated  Telemetry: NSR and AV pacing   Labs: Basic Metabolic Panel:  Recent Labs Lab 05/24/14 0346  05/24/14 1600 05/25/14 0400 05/25/14 0500  05/26/14 0320 05/26/14 0326  05/26/14 1330  05/27/14 0252  05/27/14 1658 05/28/14 0021 05/28/14 0408 05/28/14 0816  NA 124*  < >  --  121*  --   < > 126*  --   < >  --   < > 129* 129* 130* 132* 127*  K 4.7  < >  --  3.6  --   < > 3.4*  --   < > 3.7  < > 3.6 3.9 3.6 3.4* 4.2  CL 95*  < >  --  91*  --   < > 94*  --   < >  --   < > 96* 93* 93* 104 93*  CO2 23  --   --  23  --   --  24  --   --   --   --  26  --   --  22  --   GLUCOSE 119*  < >  --  115*  --   < > 93  --   < >  --   < > 106* 177* 176* 150* 210*  BUN 15  < >  --  17  --   < > 16  --   < >  --   < > 15 16 16 14 17   CREATININE 1.10  < > 1.26* 1.21  --   < > 1.36*  --   < >  --   < > 1.30* 1.30*  1.30* 1.08 1.30*  CALCIUM 7.5*  --   --  7.6*  --   --  7.7*  --   --   --   --  7.5*  --   --  6.5*  --   MG 2.8*  --  2.2  --  1.8  --   --  1.7  --  2.2  --  1.7  --   --   --   --   < > = values in this interval not displayed.  Liver Function Tests:  Recent Labs Lab 05/22/14 1907 05/25/14 0400 05/26/14 0320 05/27/14 0252 05/28/14 0408  AST 25 37 28 21 24   ALT 23 29 27 23 19   ALKPHOS 105 60 75 83 85  BILITOT 0.8 1.4* 1.3* 1.1 0.7  PROT 6.0* 4.3* 4.6* 4.4* 3.7*  ALBUMIN 2.7* 2.2* 2.3* 2.1* 1.7*   CBC:  Recent Labs Lab 05/24/14 0346  05/25/14 0400  05/26/14 0320  05/27/14 0252 05/27/14 1658 05/28/14 0021 05/28/14 0408 05/28/14 0518 05/28/14 0816  WBC 11.9*  --  19.7*  --  19.0*  --  18.2*  --   --  12.4*  --   --   HGB 7.4*  < > 8.8*  < > 8.9*  < > 10.0* 9.9* 9.5* 6.2* 8.9* 11.9*  HCT 22.1*  < > 25.5*  < > 26.3*  < > 29.7* 29.0* 28.0* 18.5* 26.2* 35.0*  MCV 75.9*  --  75.9*  --  76.5*  --  77.3*  --   --  79.4  --   --   PLT 90*  --  101*  --  91*  --  77*  --   --  59*  --   --   < > = values in this interval not displayed.  Cardiac Enzymes: No results for input(s): CKTOTAL, CKMB, CKMBINDEX, TROPONINI in the last 168 hours.  BNP: BNP (last 3 results)  Recent Labs  05/17/2014 1630  BNP 885.0*    ProBNP (last 3 results) No results for input(s): PROBNP in the  last 8760 hours.    Other results:  Imaging: Dg Chest Port 1 View  05/28/2014   CLINICAL DATA:  CABG.  EXAM: PORTABLE CHEST - 1 VIEW  COMPARISON:  05/27/2014 .  FINDINGS: Surgical staples right chest. Endotracheal to, Swan-Ganz catheter, mediastinal drainage tube, right PICC line left chest tube in stable position. Prior CABG. Stable cardiomegaly. Pulmonary interstitial edema has continued to clear. Mild basilar atelectasis again noted. No pneumothorax.  IMPRESSION: 1. Lines and tubes in stable position. Left chest tube noted. No pneumothorax. 2. Prior CABG. Continued improvement of bilateral pulmonary interstitial edema. Basilar subsegmental atelectasis .   Electronically Signed   By: Marcello Moores  Register   On: 05/28/2014 07:32   Dg Chest Port 1 View  05/27/2014   CLINICAL DATA:  Cardiogenic shock. Intubated patient with a chest tube in place.  EXAM: PORTABLE CHEST - 1 VIEW  COMPARISON:  Single view of the chest 05/26/2014 and 05/25/2014.  FINDINGS: Defibrillator pads have been removed. Support apparatus is otherwise unchanged. No pneumothorax is identified with a left chest tube in place. The patient has small to moderate bilateral pleural effusions. Mild interstitial edema continues to improve. Basilar atelectasis is noted. There is cardiomegaly.  IMPRESSION: Support apparatus projects in good position. Negative for pneumothorax with a left chest tube in place.  Continued improvement in mild interstitial edema.  Small to moderate bilateral pleural effusions and basilar atelectasis.  Electronically Signed   By: Inge Rise M.D.   On: 05/27/2014 07:31   Dg Abd Portable 1v  05/27/2014   CLINICAL DATA:  Nasogastric tube placement  EXAM: DG C-ARM 1-60 MIN - NRPT [facility]; PORTABLE ABDOMEN - 1 VIEW  COMPARISON:  CT 08/18/2010  FINDINGS: Nasogastric tube extends into the stomach. Feeding tube extends into the distal duodenum. Normal bowel gas pattern. Previous median sternotomy and CABG. Transvenous pacing  leads, left chest tube, and intra aortic pump catheter partially seen.  IMPRESSION: 1. Nasogastric tube to the stomach and feeding tube to the distal duodenum.   Electronically Signed   By: Lucrezia Europe M.D.   On: 05/27/2014 11:42     Medications:     Scheduled Medications: . sodium chloride   Intravenous Once  . acetaminophen  1,000 mg Oral 4 times per day   Or  . acetaminophen (TYLENOL) oral liquid 160 mg/5 mL  1,000 mg Per Tube 4 times per day  . antiseptic oral rinse  7 mL Mouth Rinse QID  . aspirin EC  325 mg Oral Daily   Or  . aspirin  324 mg Per Tube Daily  . bisacodyl  10 mg Oral Daily   Or  . bisacodyl  10 mg Rectal Daily  . cefTAZidime (FORTAZ)  IV  1 g Intravenous Q8H  . chlorhexidine  15 mL Mouth Rinse BID  . docusate sodium  200 mg Oral Daily  . insulin aspart  0-24 Units Subcutaneous 6 times per day  . insulin detemir  10 Units Subcutaneous BID  . insulin regular  0-10 Units Intravenous TID WC  . pantoprazole (PROTONIX) IV  40 mg Intravenous Q24H  . sodium chloride  3 mL Intravenous Q12H    Infusions: . sodium chloride Stopped (05/27/14 0120)  . sodium chloride    . sodium chloride 20 mL (05/27/14 2203)  . amiodarone 30 mg/hr (05/13/2014 2200)  . amiodarone 60 mg/hr (05/28/14 0900)  . dexmedetomidine 1 mcg/kg/hr (05/28/14 3220)  . dextrose 5 % Impella 5.0 Purge solution    . DOBUTamine 2.5 mcg/kg/min (05/28/14 0900)  . feeding supplement (VITAL 1.5 CAL) 1,000 mL (05/28/14 0900)  . furosemide (LASIX) infusion Stopped (05/27/14 1000)  . impella catheter heparin 50 unit/mL in dextrose 5%    . heparin 1,300 Units/hr (05/28/14 0900)  . insulin (NOVOLIN-R) infusion Stopped (05/27/14 1200)  . lactated ringers Stopped (05/24/14 0400)  . lactated ringers 10 mL/hr at 05/27/14 0400  . lidocaine 1 mg/min (05/28/14 0900)  . midazolam (VERSED) infusion 1 mg/hr (05/28/14 0900)  . milrinone Stopped (05/26/14 1900)  . norepinephrine (LEVOPHED) Adult infusion 19 mcg/min  (05/28/14 0915)  . phenylephrine (NEO-SYNEPHRINE) Adult infusion 0 mcg/min (06/07/2014 1700)    PRN Medications: sodium chloride, levalbuterol, morphine injection, ondansetron (ZOFRAN) IV, oxyCODONE, sodium chloride, traMADol   Assessment:   1. Cardiogenic shock 2. Ventricular fibrillation arrest 5/6 in setting of milrinone use 3. 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 3v CAD/NSTEMI: Occluded RCA, subtotally occluded prox LAD, 90% LCx 5. DM2 6. Atrial fibrillation, newly diagnosed on admission -> NSR due to defib 5/10 7. Microcytic anemia/thrombocytopenia  8. Acute kidney injury, improving 9. 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 Thrombocytopenia 11. Acute delirium 12. Hyponatremia 13. NSVT  Plan/Discussion:    S/p  CABG with Impella 5.0 support 05/24/2014.  Impella waveforms look good. Flow on pump 4.5.  Remains at P-8 support.  Plan possible removal tomorrow. Pump position looks good under u/s.    Per EP -Suspect scar-mediated VT. Continue lidocaine for now + amio 60 mg per hour.  K and mag supplemented.  May need ablation at some point, if possible.  Will need eventual wean off lidocaine onto mexiletine or ranolazine.    Diuresing well.  CVP down to 8 but still 15-20 pounds up. Will give lasix 40 iv x 1.SBP remains low. Continue norepi and dobutamine.    Platelets dropping. He has some epistaxis. Repeat CBC this afternoon. May need some platelets.  The patient is critically ill with multiple organ systems failure and requires high complexity decision making for assessment and support, frequent evaluation and titration of therapies, application of advanced monitoring technologies and extensive interpretation of multiple databases.   BensimhonQuillian Quince 05/28/2014 10:02 AM

## 2014-05-28 NOTE — Progress Notes (Addendum)
[facility] DAILY ICU PROGRESS NOTE                   [address]            [place] [address]          [phone]   5 Days Post-Op Procedure(s) (LRB): CORONARY ARTERY BYPASS GRAFTING (CABG) x  four, using left internal mammary artery and right leg greater saphenous vein harvested endoscopically (N/A) PLACEMENT OF IMPELLA 5.0 LEFT VENTRICULAR ASSIST DEVICE (N/A) TRANSESOPHAGEAL ECHOCARDIOGRAM (TEE) (N/A)  Total Length of Stay:  LOS: 14 days   Subjective: Stable overnight but continues to have ectopy.  Bleeding noted around OGT, a-line and nose.  Heparin was held yesterday after epistaxis, but now resumed.   Objective: Vital signs in last 24 hours: Temp:  [99 F (37.2 C)-100.4 F (38 C)] 99.3 F (37.4 C) (05/17 0724) Pulse Rate:  [26-90] 80 (05/17 0724) Cardiac Rhythm:  [-] Normal sinus rhythm;Atrial fibrillation (05/17 0700) Resp:  [12-27] 21 (05/17 0724) BP: (73-103)/(62-71) 75/71 mmHg (05/17 0724) SpO2:  [100 %] 100 % (05/17 0724) Arterial Line BP: (63-76)/(58-65) 63/58 mmHg (05/16 1000) FiO2 (%):  [40 %] 40 % (05/17 0724) Weight:  [218 lb 11.1 oz (99.2 kg)] 218 lb 11.1 oz (99.2 kg) (05/17 0233)  Filed Weights   05/26/14 0545 05/27/14 0400 05/28/14 0233  Weight: 219 lb 2.2 oz (99.4 kg) 216 lb 7.9 oz (98.2 kg) 218 lb 11.1 oz (99.2 kg)    Weight change: 2 lb 3.3 oz (1 kg)   Hemodynamic parameters for last 24 hours: PAP: (15-26)/(11-23) 19/15 mmHg CVP:  [4 mmHg-12 mmHg] 10 mmHg CO:  [3 L/min-3.8 L/min] 3.6 L/min CI:  [1.5 L/min/m2-1.8 L/min/m2] 1.8 L/min/m2  Intake/Output from previous day: 05/16 0701 - 05/17 0700 In: 4573.6 [I.V.:3211.9; NG/GT:535; IV Piggyback:600] Out: 1950 [Urine:1880; Emesis/NG output:290; Chest Tube:280]  Intake/Output this shift:    Current Meds: Scheduled Meds: . sodium chloride   Intravenous Once  . acetaminophen  1,000 mg Oral 4 times per day   Or  . acetaminophen (TYLENOL) oral liquid 160 mg/5 mL  1,000 mg Per Tube 4  times per day  . antiseptic oral rinse  7 mL Mouth Rinse QID  . aspirin EC  325 mg Oral Daily   Or  . aspirin  324 mg Per Tube Daily  . bisacodyl  10 mg Oral Daily   Or  . bisacodyl  10 mg Rectal Daily  . cefTAZidime (FORTAZ)  IV  1 g Intravenous Q8H  . chlorhexidine  15 mL Mouth Rinse BID  . docusate sodium  200 mg Oral Daily  . insulin aspart  0-24 Units Subcutaneous 6 times per day  . insulin detemir  10 Units Subcutaneous BID  . insulin regular  0-10 Units Intravenous TID WC  . pantoprazole (PROTONIX) IV  40 mg Intravenous Q24H  . potassium chloride  10 mEq Intravenous Q1 Hr x 3  . sodium chloride  3 mL Intravenous Q12H   Continuous Infusions: . sodium chloride Stopped (05/27/14 0120)  . sodium chloride    . sodium chloride 20 mL (05/27/14 2203)  . amiodarone 30 mg/hr (05/19/2014 2200)  . amiodarone 60 mg/hr (05/28/14 0746)  . dexmedetomidine 1 mcg/kg/hr (05/28/14 0600)  . dextrose 5 % Impella 5.0 Purge solution    . DOBUTamine 2.5 mcg/kg/min (05/28/14 0600)  . feeding supplement (VITAL 1.5 CAL) 1,000 mL (05/27/14 2100)  . furosemide (LASIX) infusion Stopped (05/27/14 1000)  . impella  catheter heparin 50 unit/mL in dextrose 5%    . heparin 1,300 Units/hr (05/28/14 0702)  . insulin (NOVOLIN-R) infusion Stopped (05/27/14 1200)  . lactated ringers Stopped (05/24/14 0400)  . lactated ringers 10 mL/hr at 05/27/14 0400  . lidocaine 1 mg/min (05/28/14 0600)  . midazolam (VERSED) infusion 2 mg/hr (05/28/14 0600)  . milrinone Stopped (05/26/14 1900)  . norepinephrine (LEVOPHED) Adult infusion 18 mcg/min (05/28/14 0600)  . phenylephrine (NEO-SYNEPHRINE) Adult infusion 0 mcg/min (05/14/2014 1700)   PRN Meds:.sodium chloride, levalbuterol, morphine injection, ondansetron (ZOFRAN) IV, oxyCODONE, sodium chloride, traMADol   Physical Exam: General appearance: Sedated on vent Heart: Irr, freq PVCs Lungs: diminished breath sounds bibasilar Abdomen: soft, non-tender; bowel sounds normal;  no masses,  no organomegaly Extremities: Mild LE edema Wound: Dressed and dry  Lab Results: CBC: Recent Labs  05/27/14 0252  05/28/14 0408 05/28/14 0518  WBC 18.2*  --  12.4*  --   HGB 10.0*  < > 6.2* 8.9*  HCT 29.7*  < > 18.5* 26.2*  PLT 77*  --  59*  --   < > = values in this interval not displayed. BMET:  Recent Labs  05/27/14 0252  05/28/14 0021 05/28/14 0408  NA 129*  < > 130* 132*  K 3.6  < > 3.6 3.4*  CL 96*  < > 93* 104  CO2 26  --   --  22  GLUCOSE 106*  < > 176* 150*  BUN 15  < > 16 14  CREATININE 1.30*  < > 1.30* 1.08  CALCIUM 7.5*  --   --  6.5*  < > = values in this interval not displayed.  PT/INR: No results for input(s): LABPROT, INR in the last 72 hours.  Radiology:  05/28/2014  COMPARISON: 05/27/2014 .  FINDINGS: Surgical staples right chest. Endotracheal to, Swan-Ganz catheter, mediastinal drainage tube, right PICC line left chest tube in stable position. Prior CABG. Stable cardiomegaly. Pulmonary interstitial edema has continued to clear. Mild basilar atelectasis again noted. No pneumothorax.  IMPRESSION: 1. Lines and tubes in stable position. Left chest tube noted. No pneumothorax. 2. Prior CABG. Continued improvement of bilateral pulmonary interstitial edema. Basilar subsegmental atelectasis .    Assessment/Plan: S/P Procedure(s) (LRB): CORONARY ARTERY BYPASS GRAFTING (CABG) x  four, using left internal mammary artery and right leg greater saphenous vein harvested endoscopically (N/A) PLACEMENT OF IMPELLA 5.0 LEFT VENTRICULAR ASSIST DEVICE (N/A) TRANSESOPHAGEAL ECHOCARDIOGRAM (TEE) (N/A)  CV- remains on  Impella support, AHF following. Gtts: Dobutamine, Amiodarone, Lidocaine, Norepi. BPs soft. Frequent atrial and ventricular ectopy but has not required further defibrillation.   Hypokalemia- replace K+.  Postop thrombocytopena- plts decreasing 77->59. Pt with some bleeding noted on Heparin. Watch closely with Impella in place.  LGF  yesterday, but trending down. WBC stable.  Continue empiric abx for possible aspiration pneumonia.  Nutrition- Continue Panda feeds. Tolerating 30 cc/hr. Will advance to goal 40 cc/hr.  Pulm- stable on vent.  Continue full support.  Expected postop blood loss anemia- H/H decreased. Continue to monitor.  COLLINS,GINA H 05/28/2014 7:50 AM   Chart reviewed, patient examined, agree with above. Glucose has been increasing to 200. Will increase Levemir. Plan echo today.

## 2014-05-29 ENCOUNTER — Inpatient Hospital Stay (HOSPITAL_COMMUNITY): Payer: Commercial Managed Care - HMO

## 2014-05-29 LAB — COMPREHENSIVE METABOLIC PANEL
ALT: 23 U/L (ref 17–63)
AST: 26 U/L (ref 15–41)
Albumin: 1.7 g/dL — ABNORMAL LOW (ref 3.5–5.0)
Alkaline Phosphatase: 107 U/L (ref 38–126)
Anion gap: 6 (ref 5–15)
BUN: 18 mg/dL (ref 6–20)
CO2: 25 mmol/L (ref 22–32)
Calcium: 7.3 mg/dL — ABNORMAL LOW (ref 8.9–10.3)
Chloride: 97 mmol/L — ABNORMAL LOW (ref 101–111)
Creatinine, Ser: 1.33 mg/dL — ABNORMAL HIGH (ref 0.61–1.24)
GFR calc Af Amer: >60 mL/min (ref >60)
GFR calc non Af Amer: 52 mL/min — ABNORMAL LOW (ref >60)
Glucose, Bld: 203 mg/dL — ABNORMAL HIGH (ref 65–99)
Potassium: 3.9 mmol/L (ref 3.5–5.1)
Sodium: 128 mmol/L — ABNORMAL LOW (ref 135–145)
Total Bilirubin: 0.8 mg/dL (ref 0.3–1.2)
Total Protein: 4.3 g/dL — ABNORMAL LOW (ref 6.5–8.1)

## 2014-05-29 LAB — CARBOXYHEMOGLOBIN
Carboxyhemoglobin: 1.7 % — ABNORMAL HIGH (ref 0.5–1.5)
Carboxyhemoglobin: 1.5 % (ref 0.5–1.5)
Carboxyhemoglobin: 1.7 % — ABNORMAL HIGH (ref 0.5–1.5)
Methemoglobin: 1.2 % (ref 0.0–1.5)
Methemoglobin: 1.2 % (ref 0.0–1.5)
Methemoglobin: 1.4 % (ref 0.0–1.5)
O2 SAT: 70.9 %
O2 Saturation: 65.1 %
O2 Saturation: 63.4 %
Total hemoglobin: 7.8 g/dL — ABNORMAL LOW (ref 13.5–18.0)
Total hemoglobin: 7.9 g/dL — ABNORMAL LOW (ref 13.5–18.0)
Total hemoglobin: 8.2 g/dL — ABNORMAL LOW (ref 13.5–18.0)

## 2014-05-29 LAB — CBC
HCT: 22.9 % — ABNORMAL LOW (ref 39.0–52.0)
HCT: 26.2 % — ABNORMAL LOW (ref 39.0–52.0)
Hemoglobin: 7.6 g/dL — ABNORMAL LOW (ref 13.0–17.0)
Hemoglobin: 9 g/dL — ABNORMAL LOW (ref 13.0–17.0)
MCH: 26.6 pg (ref 26.0–34.0)
MCH: 27.5 pg (ref 26.0–34.0)
MCHC: 33.2 g/dL (ref 30.0–36.0)
MCHC: 34.4 g/dL (ref 30.0–36.0)
MCV: 80.1 fL (ref 78.0–100.0)
MCV: 80.1 fL (ref 78.0–100.0)
Platelets: 108 10*3/uL — ABNORMAL LOW (ref 150–400)
Platelets: 144 10*3/uL — ABNORMAL LOW (ref 150–400)
RBC: 2.86 MIL/uL — ABNORMAL LOW (ref 4.22–5.81)
RBC: 3.27 MIL/uL — ABNORMAL LOW (ref 4.22–5.81)
RDW: 18.7 % — ABNORMAL HIGH (ref 11.5–15.5)
RDW: 17.3 % — ABNORMAL HIGH (ref 11.5–15.5)
WBC: 22.5 10*3/uL — ABNORMAL HIGH (ref 4.0–10.5)
WBC: 36 10*3/uL — ABNORMAL HIGH (ref 4.0–10.5)

## 2014-05-29 LAB — POCT I-STAT 3, ART BLOOD GAS (G3+)
Acid-base deficit: 2 mmol/L (ref 0.0–2.0)
Bicarbonate: 21.9 mEq/L (ref 20.0–24.0)
O2 Saturation: 95 %
PH ART: 7.428 (ref 7.350–7.450)
Patient temperature: 38
TCO2: 23 mmol/L (ref 0–100)
pCO2 arterial: 33.4 mmHg — ABNORMAL LOW (ref 35.0–45.0)
pO2, Arterial: 76 mmHg — ABNORMAL LOW (ref 80.0–100.0)

## 2014-05-29 LAB — POCT I-STAT, CHEM 8
BUN: 18 mg/dL (ref 6–20)
BUN: 19 mg/dL (ref 6–20)
BUN: 23 mg/dL — AB (ref 6–20)
CHLORIDE: 92 mmol/L — AB (ref 101–111)
CHLORIDE: 93 mmol/L — AB (ref 101–111)
CREATININE: 1.2 mg/dL (ref 0.61–1.24)
CREATININE: 1.3 mg/dL — AB (ref 0.61–1.24)
CREATININE: 1.4 mg/dL — AB (ref 0.61–1.24)
Calcium, Ion: 1.14 mmol/L (ref 1.13–1.30)
Calcium, Ion: 1.15 mmol/L (ref 1.13–1.30)
Calcium, Ion: 1.14 mmol/L (ref 1.13–1.30)
Chloride: 93 mmol/L — ABNORMAL LOW (ref 101–111)
GLUCOSE: 217 mg/dL — AB (ref 65–99)
Glucose, Bld: 179 mg/dL — ABNORMAL HIGH (ref 65–99)
Glucose, Bld: 185 mg/dL — ABNORMAL HIGH (ref 65–99)
HCT: 24 % — ABNORMAL LOW (ref 39.0–52.0)
HCT: 30 % — ABNORMAL LOW (ref 39.0–52.0)
HEMATOCRIT: 27 % — AB (ref 39.0–52.0)
Hemoglobin: 9.2 g/dL — ABNORMAL LOW (ref 13.0–17.0)
Hemoglobin: 8.2 g/dL — ABNORMAL LOW (ref 13.0–17.0)
Hemoglobin: 10.2 g/dL — ABNORMAL LOW (ref 13.0–17.0)
POTASSIUM: 3.9 mmol/L (ref 3.5–5.1)
Potassium: 3.8 mmol/L (ref 3.5–5.1)
Potassium: 3.7 mmol/L (ref 3.5–5.1)
SODIUM: 129 mmol/L — AB (ref 135–145)
SODIUM: 128 mmol/L — AB (ref 135–145)
Sodium: 129 mmol/L — ABNORMAL LOW (ref 135–145)
TCO2: 21 mmol/L (ref 0–100)
TCO2: 21 mmol/L (ref 0–100)
TCO2: 19 mmol/L (ref 0–100)

## 2014-05-29 LAB — LACTATE DEHYDROGENASE: LDH: 448 U/L — ABNORMAL HIGH (ref 98–192)

## 2014-05-29 LAB — GLUCOSE, CAPILLARY
GLUCOSE-CAPILLARY: 171 mg/dL — AB (ref 65–99)
GLUCOSE-CAPILLARY: 224 mg/dL — AB (ref 65–99)
GLUCOSE-CAPILLARY: 131 mg/dL — AB (ref 65–99)
Glucose-Capillary: 184 mg/dL — ABNORMAL HIGH (ref 65–99)
Glucose-Capillary: 210 mg/dL — ABNORMAL HIGH (ref 65–99)

## 2014-05-29 LAB — MAGNESIUM: Magnesium: 1.6 mg/dL — ABNORMAL LOW (ref 1.7–2.4)

## 2014-05-29 LAB — POCT ACTIVATED CLOTTING TIME
ACTIVATED CLOTTING TIME: 171 s
ACTIVATED CLOTTING TIME: 171 s
Activated Clotting Time: 171 seconds
Activated Clotting Time: 177 seconds
Activated Clotting Time: 171 seconds
Activated Clotting Time: 177 seconds

## 2014-05-29 LAB — PREPARE RBC (CROSSMATCH)

## 2014-05-29 LAB — PHOSPHORUS: PHOSPHORUS: 2.7 mg/dL (ref 2.5–4.6)

## 2014-05-29 LAB — APTT: aPTT: >200 seconds (ref 24–37)

## 2014-05-29 MED ORDER — VANCOMYCIN HCL IN DEXTROSE 1-5 GM/200ML-% IV SOLN
1000.0000 mg | Freq: Once | INTRAVENOUS | Status: AC
  Administered 2014-05-29: 1000 mg via INTRAVENOUS
  Filled 2014-05-29: qty 200

## 2014-05-29 MED ORDER — ALBUMIN HUMAN 5 % IV SOLN
INTRAVENOUS | Status: AC
  Administered 2014-05-29: 25 g via INTRAVENOUS
  Filled 2014-05-29: qty 500

## 2014-05-29 MED ORDER — VANCOMYCIN HCL IN DEXTROSE 750-5 MG/150ML-% IV SOLN
750.0000 mg | Freq: Two times a day (BID) | INTRAVENOUS | Status: DC
  Administered 2014-05-29: 750 mg via INTRAVENOUS
  Filled 2014-05-29 (×3): qty 150

## 2014-05-29 MED ORDER — SODIUM CHLORIDE 0.9 % IV SOLN
Freq: Once | INTRAVENOUS | Status: AC
  Administered 2014-05-29: 10:00:00 via INTRAVENOUS

## 2014-05-29 MED ORDER — FUROSEMIDE 10 MG/ML IJ SOLN
40.0000 mg | Freq: Once | INTRAMUSCULAR | Status: AC
  Administered 2014-05-29: 40 mg via INTRAVENOUS
  Filled 2014-05-29: qty 4

## 2014-05-29 MED ORDER — MAGNESIUM SULFATE BOLUS VIA INFUSION
2.0000 g | Freq: Once | INTRAVENOUS | Status: DC
  Filled 2014-05-29: qty 500

## 2014-05-29 MED ORDER — ACETAMINOPHEN 500 MG PO TABS: 1000.0000 mg | ORAL_TABLET | Freq: Four times a day (QID) | ORAL | Status: DC | PRN

## 2014-05-29 MED ORDER — ALBUMIN HUMAN 5 % IV SOLN
25.0000 g | Freq: Once | INTRAVENOUS | Status: AC
  Administered 2014-05-29: 25 g via INTRAVENOUS

## 2014-05-29 MED ORDER — MAGNESIUM SULFATE 2 GM/50ML IV SOLN
2.0000 g | Freq: Once | INTRAVENOUS | Status: AC
  Administered 2014-05-29: 2 g via INTRAVENOUS
  Filled 2014-05-29: qty 50

## 2014-05-29 MED ORDER — PRO-STAT SUGAR FREE PO LIQD
30.0000 mL | Freq: Every day | ORAL | Status: DC
  Administered 2014-05-29 – 2014-05-31 (×10): 30 mL
  Filled 2014-05-29 (×20): qty 30

## 2014-05-29 MED ORDER — ACETAMINOPHEN 160 MG/5ML PO SOLN
1000.0000 mg | Freq: Four times a day (QID) | ORAL | Status: DC | PRN
  Administered 2014-05-29 – 2014-05-30 (×2): 1000 mg
  Filled 2014-05-29: qty 40.6

## 2014-05-29 MED ORDER — VITAL HIGH PROTEIN PO LIQD
1000.0000 mL | ORAL | Status: DC
  Administered 2014-05-29 (×8)
  Administered 2014-05-29: 1000 mL
  Administered 2014-05-30 (×7)
  Filled 2014-05-29 (×3): qty 1000

## 2014-05-29 MED ORDER — MORPHINE SULFATE 2 MG/ML IJ SOLN
2.0000 mg | INTRAMUSCULAR | Status: DC | PRN
  Administered 2014-05-29: 2 mg via INTRAVENOUS
  Filled 2014-05-29: qty 1

## 2014-05-29 MED ORDER — INSULIN DETEMIR 100 UNIT/ML ~~LOC~~ SOLN
30.0000 [IU] | Freq: Every day | SUBCUTANEOUS | Status: DC
Start: 2014-05-30 — End: 2014-05-30
  Administered 2014-05-30: 30 [IU] via SUBCUTANEOUS
  Filled 2014-05-29: qty 0.3

## 2014-05-29 MED ORDER — FUROSEMIDE 10 MG/ML IJ SOLN
40.0000 mg | Freq: Once | INTRAMUSCULAR | Status: AC
  Administered 2014-05-29: 40 mg via INTRAVENOUS

## 2014-05-29 MED ORDER — POTASSIUM CHLORIDE 10 MEQ/50ML IV SOLN
10.0000 meq | INTRAVENOUS | Status: AC
  Administered 2014-05-29 (×3): 10 meq via INTRAVENOUS
  Filled 2014-05-29: qty 50

## 2014-05-29 NOTE — Progress Notes (Signed)
Patient ID: Jeffrey [name], male   DOB: [birth year], 74 y.o.   MRN: [id number] EVENING ROUNDS NOTE :     [address]       [place] [address]             [phone]                 6 Days Post-Op Procedure(s) (LRB): CORONARY ARTERY BYPASS GRAFTING (CABG) x  four, using left internal mammary artery and right leg greater saphenous vein harvested endoscopically (N/A) PLACEMENT OF IMPELLA 5.0 LEFT VENTRICULAR ASSIST DEVICE (N/A) TRANSESOPHAGEAL ECHOCARDIOGRAM (TEE) (N/A)  Total Length of Stay:  LOS: 15 days  BP 84/62 mmHg  Pulse 27  Temp(Src) 96.8 F (36 C) (Axillary)  Resp 24  Ht 5\' 9"  (1.753 m)  Wt 231 lb (104.781 kg)  BMI 34.10 kg/m2  SpO2 100%  .Intake/Output      05/17 0701 - 05/18 0700 05/18 0701 - 05/19 0700   I.V. (mL/kg) 3364.7 (32.1) 1706.8 (16.3)   Blood  660   Other 234.6 147.8   NG/GT 1297.5 615.3   IV Piggyback 250 500   Total Intake(mL/kg) 5146.8 (49.1) 3630 (34.6)   Urine (mL/kg/hr) 1625 (0.6) 690 (0.6)   Emesis/NG output 100 (0) 50 (0)   Chest Tube 530 (0.2) 520 (0.4)   Total Output 2255 1260   Net +2891.8 +2370          . sodium chloride Stopped (05/27/14 0120)  . sodium chloride    . sodium chloride 20 mL/hr at 05/29/14 1800  . amiodarone 30 mg/hr (05/17/2014 2200)  . amiodarone 60 mg/hr (05/29/14 1800)  . dexmedetomidine 0.7 mcg/kg/hr (05/29/14 1800)  . dextrose 5 % Impella 5.0 Purge solution    . DOBUTamine 2.5 mcg/kg/min (05/29/14 1800)  . impella catheter heparin 50 unit/mL in dextrose 5%    . heparin 1,300 Units/hr (05/29/14 1800)  . lactated ringers Stopped (05/24/14 0400)  . lactated ringers 10 mL/hr at 05/29/14 1800  . lidocaine 1 mg/min (05/29/14 1800)  . midazolam (VERSED) infusion 2 mg/hr (05/29/14 1800)  . milrinone Stopped (05/26/14 1900)  . norepinephrine (LEVOPHED) Adult infusion 36 mcg/min (05/29/14 1800)  . phenylephrine (NEO-SYNEPHRINE) Adult infusion 0 mcg/min (05/21/2014 1700)     Lab Results  Component  Value Date   WBC 22.5* 05/29/2014   HGB 10.2* 05/29/2014   HCT 30.0* 05/29/2014   PLT 108* 05/29/2014   GLUCOSE 185* 05/29/2014   CHOL 100 05/17/2014   TRIG 50 05/17/2014   HDL 35* 05/17/2014   LDLCALC 55 05/17/2014   ALT 23 05/29/2014   AST 26 05/29/2014   NA 128* 05/29/2014   K 3.7 05/29/2014   CL 93* 05/29/2014   CREATININE 1.40* 05/29/2014   BUN 23* 05/29/2014   CO2 25 05/29/2014   TSH 6.794* 05/31/2014   INR 1.49 05/24/2014   HGBA1C 7.5* 05/21/2014   Fever, elevated WBC noted , vancomycin added Unable to wean impella and levaphed increased  Grace Isaac MD  Beeper [phone] Office [phone] 05/29/2014 6:33 PM

## 2014-05-29 NOTE — Progress Notes (Signed)
Per nursing protocol, blood products cannot be administered if pt temp is > 100.4. MD made aware of pt's elevated temp, and he is comfortable administering 2 units PRBC despite temp of 101. 1st unit infusing now. Will continue to monitor pt.

## 2014-05-29 NOTE — Progress Notes (Addendum)
Nutrition Brief Note  RD recommending TF regimen change to Vital High Protein formula at goal rate of 40 ml/hr with Prostat liquid protein 30 ml 5 times daily.  Telephone with readback orders received per Suzzanne Cloud, PA-C.  Total TF regimen to provide 1460 kcals, 159 gm protein, 903 ml of free water.     Arthur Holms, RD, LDN Pager #: [phone] After-Hours Pager #: [phone]

## 2014-05-29 NOTE — Progress Notes (Addendum)
[facility] DAILY ICU PROGRESS NOTE                   [address]            [place] [address]          [phone]   6 Days Post-Op Procedure(s) (LRB): CORONARY ARTERY BYPASS GRAFTING (CABG) x  four, using left internal mammary artery and right leg greater saphenous vein harvested endoscopically (N/A) PLACEMENT OF IMPELLA 5.0 LEFT VENTRICULAR ASSIST DEVICE (N/A) TRANSESOPHAGEAL ECHOCARDIOGRAM (TEE) (N/A)  Total Length of Stay:  LOS: 15 days   Subjective: Continues to have a lot of ectopy.  Impella repositioned last night by Dr. Haroldine Laws. Remains sedated on vent.   Objective: Vital signs in last 24 hours: Temp:  [99.5 F (37.5 C)-100.9 F (38.3 C)] 100.9 F (38.3 C) (05/18 0700) Pulse Rate:  [25-82] 82 (05/18 0726) Cardiac Rhythm:  [-] Normal sinus rhythm (05/18 0800) Resp:  [6-31] 30 (05/18 0726) BP: (79-123)/(47-79) 101/52 mmHg (05/18 0726) SpO2:  [76 %-100 %] 100 % (05/18 0800) FiO2 (%):  [40 %] 40 % (05/18 0726) Weight:  [231 lb (104.781 kg)] 231 lb (104.781 kg) (05/18 0544)  Filed Weights   05/27/14 0400 05/28/14 0233 05/29/14 0544  Weight: 216 lb 7.9 oz (98.2 kg) 218 lb 11.1 oz (99.2 kg) 231 lb (104.781 kg)    Weight change: 12 lb 4.9 oz (5.581 kg)   Hemodynamic parameters for last 24 hours: PAP: (18-29)/(11-22) 20/13 mmHg CVP:  [7 mmHg-15 mmHg] 7 mmHg CO:  [3.2 L/min-4.4 L/min] 3.5 L/min CI:  [1.5 L/min/m2-2.2 L/min/m2] 1.7 L/min/m2  Intake/Output from previous day: 05/17 0701 - 05/18 0700 In: 5146.8 [I.V.:3364.7; NG/GT:1297.5; IV Piggyback:250] Out: 2255 [Urine:1625; Emesis/NG output:100; Chest Tube:530]  Intake/Output this shift: Total I/O In: 373.5 [I.V.:281.4; Other:12.1; NG/GT:80] Out: 270 [Urine:100; Emesis/NG output:50; Chest Tube:120]  Current Meds: Scheduled Meds: . sodium chloride   Intravenous Once  . sodium chloride   Intravenous Once  . antiseptic oral rinse  7 mL Mouth Rinse QID  . aspirin EC  325 mg Oral Daily   Or  .  aspirin  324 mg Per Tube Daily  . bisacodyl  10 mg Oral Daily   Or  . bisacodyl  10 mg Rectal Daily  . cefTAZidime (FORTAZ)  IV  1 g Intravenous Q8H  . chlorhexidine  15 mL Mouth Rinse BID  . docusate sodium  200 mg Oral Daily  . insulin aspart  0-24 Units Subcutaneous 6 times per day  . insulin detemir  30 Units Subcutaneous BID  . magnesium  2 g Intravenous Once  . pantoprazole (PROTONIX) IV  40 mg Intravenous Q24H  . sodium chloride  3 mL Intravenous Q12H  . vancomycin  1,000 mg Intravenous Once  . vancomycin  750 mg Intravenous Q12H   Continuous Infusions: . sodium chloride Stopped (05/27/14 0120)  . sodium chloride    . sodium chloride 20 mL/hr at 05/29/14 0800  . amiodarone 30 mg/hr (06/08/2014 2200)  . amiodarone 60 mg/hr (05/29/14 0802)  . dexmedetomidine 1 mcg/kg/hr (05/29/14 0800)  . dextrose 5 % Impella 5.0 Purge solution    . DOBUTamine 2.5 mcg/kg/min (05/29/14 0800)  . feeding supplement (VITAL 1.5 CAL) 1,000 mL (05/29/14 0800)  . impella catheter heparin 50 unit/mL in dextrose 5%    . heparin 1,300 Units/hr (05/29/14 0800)  . lactated ringers Stopped (05/24/14 0400)  . lactated ringers 10 mL/hr at 05/29/14 0800  . lidocaine 1 mg/min (  05/29/14 0800)  . midazolam (VERSED) infusion 3 mg/hr (05/29/14 0800)  . milrinone Stopped (05/26/14 1900)  . norepinephrine (LEVOPHED) Adult infusion 20 mcg/min (05/29/14 0830)  . phenylephrine (NEO-SYNEPHRINE) Adult infusion 0 mcg/min (05/25/2014 1700)   PRN Meds:.sodium chloride, acetaminophen (TYLENOL) oral liquid 160 mg/5 mL, levalbuterol, morphine injection, ondansetron (ZOFRAN) IV, oxyCODONE, sodium chloride, traMADol   CBGs [phone]   Physical Exam: General appearance: Intubated, sedated on vent Heart: RRR, freq PVCs, PACs Lungs: Coarse BS bilaterally Abdomen: soft, non-tender; bowel sounds normal; no masses,  no organomegaly Extremities: Mild LE edema Wound: Dressed and dry   Lab Results: CBC: Recent  Labs  05/28/14 1548  05/29/14 0300 05/29/14 0833  WBC 19.2*  --  22.5*  --   HGB 8.6*  < > 7.6* 8.2*  HCT 25.6*  < > 22.9* 24.0*  PLT 84*  --  108*  --   < > = values in this interval not displayed. BMET:  Recent Labs  05/28/14 0408  05/29/14 0300 05/29/14 0833  NA 132*  < > 128* 129*  K 3.4*  < > 3.9 3.9  CL 104  < > 97* 92*  CO2 22  --  25  --   GLUCOSE 150*  < > 203* 179*  BUN 14  < > 18 19  CREATININE 1.08  < > 1.33* 1.20  CALCIUM 6.5*  --  7.3*  --   < > = values in this interval not displayed.  PT/INR: No results for input(s): LABPROT, INR in the last 72 hours.  Radiology: Dg Chest Port 1 View  05/29/2014   CLINICAL DATA:  CABG.  EXAM: PORTABLE CHEST - 1 VIEW   COMPARISON: 05/28/2014.  FINDINGS: Endotracheal tube, left chest tube, Swan-Ganz catheter, right PICC line, mediastinal drainage catheter in stable position. Stable cardiomegaly. Diffuse increase bilateral pulmonary infiltrates consistent with pulmonary edema. Pneumonia cannot be excluded. Tiny pleural effusions cannot be excluded. No pneumothorax.  IMPRESSION: 1. Lines and tubes including left chest tube in stable position. No pneumothorax. 2. Cardiomegaly. Prior CABG. Diffuse increase bilateral pulmonary infiltrates. Small pleural effusions cannot be excluded. These findings suggest congestive heart failure.   Assessment/Plan: S/P Procedure(s) (LRB): CORONARY ARTERY BYPASS GRAFTING (CABG) x  four, using left internal mammary artery and right leg greater saphenous vein harvested endoscopically (N/A) PLACEMENT OF IMPELLA 5.0 LEFT VENTRICULAR ASSIST DEVICE (N/A) TRANSESOPHAGEAL ECHOCARDIOGRAM (TEE) (N/A)  CV-Impella repositioned overnight, waveforms improved. Continues to have ectopy, but no further VT.  Gtts: Dobutamine, Amiodarone, Lidocaine. BPs generally stable. Continue care per AHF team/EP.  Postop thrombocytopena- plts improved, still with minor bleeding around lines, etc.  Monitor  closely.  LGF persists, Leukocytosis worsening.  Will obtain blood cultures, broaden abx coverage by adding Vanc, continue to follow clinically.  Nutrition- Continue Panda feeds.   Pulm- stable on vent. Continue full support.  Expected postop blood loss anemia- H/H down further today. Will transfuse and watch.  DM- sugars remain elevated, insulin adjusted yesterday.  May need further titration while on TFs.  COLLINS,GINA H 05/29/2014 8:48 AM   Chart reviewed, patient examined, agree with above. Fever, worsening leukocytosis and hemodynamic instability may indicate developing sepsis. Add vanc to [place]. Will need line change. Hgb dropped to 7.6 this am and still has clotting chest tube output. Transfused 2 units prbc's. ACT maintained at 170 and plts improved.

## 2014-05-29 NOTE — Progress Notes (Signed)
Dr. Haroldine Laws notified that pt MAPs were decreasing into the 50s with the Impella on P5. Verbal order received to increase Impella to P8. MAPs initially increased to the 70s but shortly after began to decrease again into the low 60s. MD notified again and orders were given to increase Levophed as needed up to a dose of 40 mcg/min. MAP improved into the 70s on increased Levophed dose. Will continue to monitor pt.

## 2014-05-29 NOTE — Progress Notes (Signed)
Advanced Heart Failure Rounding Note   Subjective:    Underwent CABG and Impella 5.0 placement on 5/12  Remains intubated and sedated. 40% FiO2. Dobutamine at 2.5. Levophed at 20. Unable to wean. Had more ectopy when dobutamine turned up to 5. On lido 1 and amio 60. No further VT.  Impella adjusted yesterday. Febrile overnight. BCx ordered. Vanc started. Already on Fortaz   PA pressures look good. CVP 9. Not getting lasix. Weight up.   Impella flow 4.5.  Co-ox 65% Thermodilution numbers inaccurate (we had this problem in 2H as well)  Swan Numbers this am PAP 25/17 CVP 9 CO/CI 3.5/ 1.7    Objective:    Vital Signs:   Temp:  [99.5 F (37.5 C)-101.1 F (38.4 C)] 100.6 F (38.1 C) (05/18 1020) Pulse Rate:  [25-85] 56 (05/18 1000) Resp:  [6-31] 23 (05/18 1020) BP: (74-123)/(47-79) 74/53 mmHg (05/18 1000) SpO2:  [76 %-100 %] 100 % (05/18 1020) FiO2 (%):  [40 %] 40 % (05/18 0726) Weight:  [231 lb (104.781 kg)] 231 lb (104.781 kg) (05/18 0544) Last BM Date: 05/22/14  Weight change: Filed Weights   05/27/14 0400 05/28/14 0233 05/29/14 0544  Weight: 216 lb 7.9 oz (98.2 kg) 218 lb 11.1 oz (99.2 kg) 231 lb (104.781 kg)    Intake/Output:   Intake/Output Summary (Last 24 hours) at 05/29/14 1031 Last data filed at 05/29/14 1000  Gross per 24 hour  Intake 5000.82 ml  Output   2490 ml  Net 2510.82 ml     Physical Exam: General: Intubated sedated HEENT: normal Neck: supple. RIJ swan. No lymphadenopathy or thryomegaly appreciated. Cor: R subclavian Impella sheath. Sternal dressing Regular rhythm, controlled rate. impella hum Lungs: clear Abdomen: soft, nontender, minimally distended. No hepatosplenomegaly. No bruits or masses. hypoactive bowel sounds. Extremities: no cyanosis, clubbing, rash,  R and LLE SCDs. 1-2+ edema.  Neuro: intubated sedated  Telemetry: NSR and AV pacing   Labs: Basic Metabolic Panel:  Recent Labs Lab 05/25/14 0400 05/25/14 0500   05/26/14 0320 05/26/14 0326  05/26/14 1330  05/27/14 0252  05/28/14 0408 05/28/14 0816 05/28/14 1547 05/29/14 0020 05/29/14 0300 05/29/14 0833  NA 121*  --   < > 126*  --   < >  --   < > 129*  < > 132* 127* 127* 129* 128* 129*  K 3.6  --   < > 3.4*  --   < > 3.7  < > 3.6  < > 3.4* 4.2 4.0 3.8 3.9 3.9  CL 91*  --   < > 94*  --   < >  --   < > 96*  < > 104 93* 95* 93* 97* 92*  CO2 23  --   --  24  --   --   --   --  26  --  22  --   --   --  25  --   GLUCOSE 115*  --   < > 93  --   < >  --   < > 106*  < > 150* 210* 193* 217* 203* 179*  BUN 17  --   < > 16  --   < >  --   < > 15  < > 14 17 18 18 18 19   CREATININE 1.21  --   < > 1.36*  --   < >  --   < > 1.30*  < > 1.08 1.30* 1.30* 1.30* 1.33* 1.20  CALCIUM 7.6*  --   --  7.7*  --   --   --   --  7.5*  --  6.5*  --   --   --  7.3*  --   MG  --  1.8  --   --  1.7  --  2.2  --  1.7  --   --   --   --   --  1.6*  --   PHOS  --   --   --   --   --   --   --   --   --   --   --   --   --   --  2.7  --   < > = values in this interval not displayed.  Liver Function Tests:  Recent Labs Lab 05/25/14 0400 05/26/14 0320 05/27/14 0252 05/28/14 0408 05/29/14 0300  AST 37 28 21 24 26   ALT 29 27 23 19 23   ALKPHOS 60 75 83 85 107  BILITOT 1.4* 1.3* 1.1 0.7 0.8  PROT 4.3* 4.6* 4.4* 3.7* 4.3*  ALBUMIN 2.2* 2.3* 2.1* 1.7* 1.7*   CBC:  Recent Labs Lab 05/26/14 0320  05/27/14 0252  05/28/14 0408  05/28/14 1547 05/28/14 1548 05/29/14 0020 05/29/14 0300 05/29/14 0833  WBC 19.0*  --  18.2*  --  12.4*  --   --  19.2*  --  22.5*  --   HGB 8.9*  < > 10.0*  < > 6.2*  < > 9.2* 8.6* 9.2* 7.6* 8.2*  HCT 26.3*  < > 29.7*  < > 18.5*  < > 27.0* 25.6* 27.0* 22.9* 24.0*  MCV 76.5*  --  77.3*  --  79.4  --   --  78.8  --  80.1  --   PLT 91*  --  77*  --  59*  --   --  84*  --  108*  --   < > = values in this interval not displayed.  Cardiac Enzymes: No results for input(s): CKTOTAL, CKMB, CKMBINDEX, TROPONINI in the last 168 hours.  BNP: BNP  (last 3 results)  Recent Labs  06/04/2014 1630  BNP 885.0*    ProBNP (last 3 results) No results for input(s): PROBNP in the last 8760 hours.    Other results:  Imaging: Dg Chest Port 1 View  05/29/2014   CLINICAL DATA:  CABG.  EXAM: PORTABLE CHEST - 1 VIEW  COMPARISON:  05/28/2014.  FINDINGS: Endotracheal tube, left chest tube, Swan-Ganz catheter, right PICC line, mediastinal drainage catheter in stable position. Stable cardiomegaly. Diffuse increase bilateral pulmonary infiltrates consistent with pulmonary edema. Pneumonia cannot be excluded. Tiny pleural effusions cannot be excluded. No pneumothorax.  IMPRESSION: 1. Lines and tubes including left chest tube in stable position. No pneumothorax. 2. Cardiomegaly. Prior CABG. Diffuse increase bilateral pulmonary infiltrates. Small pleural effusions cannot be excluded. These findings suggest congestive heart failure.   Electronically Signed   By: Marcello Moores  Register   On: 05/29/2014 07:33   Dg Chest Port 1 View  05/28/2014   CLINICAL DATA:  CABG.  EXAM: PORTABLE CHEST - 1 VIEW  COMPARISON:  05/27/2014 .  FINDINGS: Surgical staples right chest. Endotracheal to, Swan-Ganz catheter, mediastinal drainage tube, right PICC line left chest tube in stable position. Prior CABG. Stable cardiomegaly. Pulmonary interstitial edema has continued to clear. Mild basilar atelectasis again noted. No pneumothorax.  IMPRESSION: 1. Lines and tubes in stable position. Left chest tube noted. No pneumothorax.  2. Prior CABG. Continued improvement of bilateral pulmonary interstitial edema. Basilar subsegmental atelectasis .   Electronically Signed   By: Marcello Moores  Register   On: 05/28/2014 07:32   Dg Abd Portable 1v  05/27/2014   CLINICAL DATA:  Nasogastric tube placement  EXAM: DG C-ARM 1-60 MIN - NRPT [facility]; PORTABLE ABDOMEN - 1 VIEW  COMPARISON:  CT 08/18/2010  FINDINGS: Nasogastric tube extends into the stomach. Feeding tube extends into the distal duodenum. Normal bowel gas  pattern. Previous median sternotomy and CABG. Transvenous pacing leads, left chest tube, and intra aortic pump catheter partially seen.  IMPRESSION: 1. Nasogastric tube to the stomach and feeding tube to the distal duodenum.   Electronically Signed   By: Lucrezia Europe M.D.   On: 05/27/2014 11:42     Medications:     Scheduled Medications: . sodium chloride   Intravenous Once  . antiseptic oral rinse  7 mL Mouth Rinse QID  . aspirin EC  325 mg Oral Daily   Or  . aspirin  324 mg Per Tube Daily  . bisacodyl  10 mg Oral Daily   Or  . bisacodyl  10 mg Rectal Daily  . cefTAZidime (FORTAZ)  IV  1 g Intravenous Q8H  . chlorhexidine  15 mL Mouth Rinse BID  . docusate sodium  200 mg Oral Daily  . feeding supplement (PRO-STAT SUGAR FREE 64)  30 mL Per Tube 5 X Daily  . feeding supplement (VITAL HIGH PROTEIN)  1,000 mL Per Tube Q24H  . insulin aspart  0-24 Units Subcutaneous 6 times per day  . insulin detemir  30 Units Subcutaneous BID  . magnesium sulfate 1 - 4 g bolus IVPB  2 g Intravenous Once  . pantoprazole (PROTONIX) IV  40 mg Intravenous Q24H  . sodium chloride  3 mL Intravenous Q12H  . vancomycin  1,000 mg Intravenous Once  . vancomycin  750 mg Intravenous Q12H    Infusions: . sodium chloride Stopped (05/27/14 0120)  . sodium chloride    . sodium chloride 20 mL/hr at 05/29/14 1000  . amiodarone 30 mg/hr (05/22/2014 2200)  . amiodarone 60 mg/hr (05/29/14 1000)  . dexmedetomidine 1 mcg/kg/hr (05/29/14 1000)  . dextrose 5 % Impella 5.0 Purge solution    . DOBUTamine 2.5 mcg/kg/min (05/29/14 1000)  . impella catheter heparin 50 unit/mL in dextrose 5%    . heparin 1,300 Units/hr (05/29/14 1000)  . lactated ringers Stopped (05/24/14 0400)  . lactated ringers 10 mL/hr at 05/29/14 1000  . lidocaine 1 mg/min (05/29/14 1000)  . midazolam (VERSED) infusion 2 mg/hr (05/29/14 1000)  . milrinone Stopped (05/26/14 1900)  . norepinephrine (LEVOPHED) Adult infusion 20 mcg/min (05/29/14 1000)  .  phenylephrine (NEO-SYNEPHRINE) Adult infusion 0 mcg/min (05/28/2014 1700)    PRN Medications: sodium chloride, acetaminophen (TYLENOL) oral liquid 160 mg/5 mL, levalbuterol, morphine injection, ondansetron (ZOFRAN) IV, oxyCODONE, sodium chloride, traMADol   Assessment:   1. Cardiogenic shock 2. Ventricular fibrillation arrest 5/6 in setting of milrinone use 3. 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 3v CAD/NSTEMI: Occluded RCA, subtotally occluded prox LAD, 90% LCx 5. DM2 6. Atrial fibrillation, newly diagnosed on admission -> NSR due to defib 5/10 7. Microcytic anemia/thrombocytopenia  8. Acute kidney injury, improving 9. 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 Thrombocytopenia 11. Acute delirium 12. Hyponatremia 13. NSVT  Plan/Discussion:    S/p  CABG with Impella 5.0 support 05/27/2014.  Impella waveforms look good. Flow on pump 4.5.  Remains at P-8 support. Plan possible removal tomorrow. Pump position adjusted under u/s yesterday. Will pull swan. Begin Impella wean today. Go to P-5. Check co-ox in 2 hours.   VT improved. Suspect scar-mediated VT. Continue lidocaine for now + amio 60 mg per hour.  K and mag supplemented.  Will need eventual wean off lidocaine onto mexiletine or ranolazine.    Volume up. CVP down to 9 but weight up. Will give lasix 40 iv today. Continue norepi and dobutamine.    Platelets improved  Having fevers. Vanc added. Bcx ordered. Will check sputum cx.   The patient is critically ill with multiple organ systems failure and requires high complexity decision making for assessment and support, frequent evaluation and titration of therapies, application of advanced monitoring technologies and extensive interpretation of multiple databases.   BensimhonQuillian Quince 05/29/2014 10:31 AM

## 2014-05-29 NOTE — Progress Notes (Signed)
SUBJECTIVE: The patient is intubated and sedated.  Significantly decreased ventricular ectopy.  CURRENT MEDICATIONS: . sodium chloride   Intravenous Once  . antiseptic oral rinse  7 mL Mouth Rinse QID  . aspirin EC  325 mg Oral Daily   Or  . aspirin  324 mg Per Tube Daily  . bisacodyl  10 mg Oral Daily   Or  . bisacodyl  10 mg Rectal Daily  . cefTAZidime (FORTAZ)  IV  1 g Intravenous Q8H  . chlorhexidine  15 mL Mouth Rinse BID  . docusate sodium  200 mg Oral Daily  . feeding supplement (PRO-STAT SUGAR FREE 64)  30 mL Per Tube 5 X Daily  . feeding supplement (VITAL HIGH PROTEIN)  1,000 mL Per Tube Q24H  . insulin aspart  0-24 Units Subcutaneous 6 times per day  . insulin detemir  30 Units Subcutaneous BID  . magnesium sulfate 1 - 4 g bolus IVPB  2 g Intravenous Once  . pantoprazole (PROTONIX) IV  40 mg Intravenous Q24H  . sodium chloride  3 mL Intravenous Q12H  . vancomycin  1,000 mg Intravenous Once  . vancomycin  750 mg Intravenous Q12H   . sodium chloride Stopped (05/27/14 0120)  . sodium chloride    . sodium chloride 20 mL/hr at 05/29/14 0900  . amiodarone 30 mg/hr (05/22/2014 2200)  . amiodarone 60 mg/hr (05/29/14 0900)  . dexmedetomidine 1 mcg/kg/hr (05/29/14 0915)  . dextrose 5 % Impella 5.0 Purge solution    . DOBUTamine 2.5 mcg/kg/min (05/29/14 0900)  . impella catheter heparin 50 unit/mL in dextrose 5%    . heparin 1,300 Units/hr (05/29/14 0900)  . lactated ringers Stopped (05/24/14 0400)  . lactated ringers 10 mL/hr at 05/29/14 0900  . lidocaine 1 mg/min (05/29/14 0900)  . midazolam (VERSED) infusion 3 mg/hr (05/29/14 0900)  . milrinone Stopped (05/26/14 1900)  . norepinephrine (LEVOPHED) Adult infusion 20 mcg/min (05/29/14 0900)  . phenylephrine (NEO-SYNEPHRINE) Adult infusion 0 mcg/min (06/03/2014 1700)    OBJECTIVE: Physical Exam: Filed Vitals:   05/29/14 0900 05/29/14 0915 05/29/14 0930 05/29/14 0945  BP: 93/64  87/54   Pulse:  85 54   Temp: 101.1 F  (38.4 C) 100.9 F (38.3 C) 100.8 F (38.2 C) 100.8 F (38.2 C)  TempSrc: Core (Comment)     Resp: 31 31 26 22   Height:      Weight:      SpO2:  100% 99%     Intake/Output Summary (Last 24 hours) at 05/29/14 8675 Last data filed at 05/29/14 0900  Gross per 24 hour  Intake 4914.75 ml  Output   2380 ml  Net 2534.75 ml    Telemetry reveals sinus rhythm with runs of atrial ectopy and frequent PAC's  GEN- The patient is intubated and sedated, ill appearing Head- normocephalic, atraumatic Eyes-  Sclera clear, conjunctiva pink Ears- hearing intact Oropharynx- intubated Neck- supple, R subclavian Impella sheath Lymph- no cervical lymphadenopathy Lungs- Clear to ausculation bilaterally, normal work of breathing Heart- Regular rate and rhythm, no murmurs, rubs or gallops  GI- soft, NT, ND, + BS Extremities- no clubbing, cyanosis, + dependant edema Skin- no rash or lesion   LABS: Basic Metabolic Panel:  Recent Labs  05/27/14 0252  05/28/14 0408  05/29/14 0300 05/29/14 0833  NA 129*  < > 132*  < > 128* 129*  K 3.6  < > 3.4*  < > 3.9 3.9  CL 96*  < > 104  < >  97* 92*  CO2 26  --  22  --  25  --   GLUCOSE 106*  < > 150*  < > 203* 179*  BUN 15  < > 14  < > 18 19  CREATININE 1.30*  < > 1.08  < > 1.33* 1.20  CALCIUM 7.5*  --  6.5*  --  7.3*  --   MG 1.7  --   --   --  1.6*  --   PHOS  --   --   --   --  2.7  --   < > = values in this interval not displayed. Liver Function Tests:  Recent Labs  05/28/14 0408 05/29/14 0300  AST 24 26  ALT 19 23  ALKPHOS 85 107  BILITOT 0.7 0.8  PROT 3.7* 4.3*  ALBUMIN 1.7* 1.7*    CBC:  Recent Labs  05/28/14 1548  05/29/14 0300 05/29/14 0833  WBC 19.2*  --  22.5*  --   HGB 8.6*  < > 7.6* 8.2*  HCT 25.6*  < > 22.9* 24.0*  MCV 78.8  --  80.1  --   PLT 84*  --  108*  --   < > = values in this interval not displayed.  RADIOLOGY: Dg Chest Port 1 View 05/28/2014   CLINICAL DATA:  CABG.  EXAM: PORTABLE CHEST - 1 VIEW   COMPARISON:  05/27/2014 .  FINDINGS: Surgical staples right chest. Endotracheal to, Swan-Ganz catheter, mediastinal drainage tube, right PICC line left chest tube in stable position. Prior CABG. Stable cardiomegaly. Pulmonary interstitial edema has continued to clear. Mild basilar atelectasis again noted. No pneumothorax.  IMPRESSION: 1. Lines and tubes in stable position. Left chest tube noted. No pneumothorax. 2. Prior CABG. Continued improvement of bilateral pulmonary interstitial edema. Basilar subsegmental atelectasis .   Electronically Signed   By: Marcello Moores  Register   On: 05/28/2014 07:32   ASSESSMENT AND PLAN:  Active Problems:   NSTEMI (non-ST elevated myocardial infarction)   Atrial fibrillation   Diabetes   Hypertension   Acute systolic CHF (congestive heart failure)   Paroxysmal VT   Hypothyroidism   Microcytic anemia   Hypocalcemia   Atrial fibrillation, unspecified   VF (ventricular fibrillation)   S/P CABG x 4  1.  Ventricular tachycardia Continue Amiodarone for now Discussed discontinuation of Lidocaine with Dr Haroldine Laws - he would prefer to wait until Impella removed  Transition to Mexiletine when able Lidocaine level not yet back  2.  Acute on chronic systolic heart failure Per AHF team  3.  CAD s/p CABG Per TCTS   Chanetta Marshall, NP 05/29/2014 9:53 AM More VT this afternoon and the ectopy is moniomorphic. Looking at XR and realizing about impella manipulation i wonder wehther the device is mechanically triggering some of the vt Still on lido and Amio.  Await level  Now with fever and progressive hypotension, worrisome for infection

## 2014-05-29 NOTE — Progress Notes (Signed)
Spoke with Dr. Cyndia Bent regarding irregular placement signal and motor current waveforms on the impella. Patient's arterial line pulsatile every 4th beat. Increase in amount of PVCs and PACs. Short bursts of vtach noted. Morning labs drawn and pending. Pulses still able to be dopplered in feet- monophasic, occasional pulse sound heard (consistent pulsatile sound heard at start of shift.) At this time will continue to closely monitor. Richarda Blade RN

## 2014-05-29 NOTE — Progress Notes (Signed)
CRITICAL VALUE ALERT  Critical value received:  PTT >200  Date of notification:  05/29/14  Time of notification:  0500  Critical value read back: yes  Nurse who received alert:  A. Ralphine Hinks RN  Consistent with previously called value. Will pass value on to day RN. Richarda Blade RN

## 2014-05-30 ENCOUNTER — Encounter (HOSPITAL_COMMUNITY): Admission: EM | Disposition: E | Payer: Self-pay | Source: Home / Self Care | Attending: Cardiothoracic Surgery

## 2014-05-30 ENCOUNTER — Inpatient Hospital Stay (HOSPITAL_COMMUNITY): Payer: Commercial Managed Care - HMO

## 2014-05-30 DIAGNOSIS — A419 Sepsis, unspecified organism: Secondary | ICD-10-CM

## 2014-05-30 DIAGNOSIS — R6521 Severe sepsis with septic shock: Secondary | ICD-10-CM

## 2014-05-30 LAB — APTT: aPTT: 78 seconds — ABNORMAL HIGH (ref 24–37)

## 2014-05-30 LAB — TYPE AND SCREEN
ABO/RH(D): A POS
ANTIBODY SCREEN: NEGATIVE
Unit division: 0
Unit division: 0
Unit division: 0

## 2014-05-30 LAB — BLOOD GAS, ARTERIAL
Acid-base deficit: 2.8 mmol/L — ABNORMAL HIGH (ref 0.0–2.0)
Bicarbonate: 20.3 mEq/L (ref 20.0–24.0)
Drawn by: 39898
FIO2: 0.4 %
MECHVT: 550 mL
O2 Saturation: 98.8 %
PEEP: 5 cmH2O
Patient temperature: 98.6
RATE: 12 resp/min
TCO2: 21.1 mmol/L (ref 0–100)
pCO2 arterial: 27.8 mmHg — ABNORMAL LOW (ref 35.0–45.0)
pH, Arterial: 7.475 — ABNORMAL HIGH (ref 7.350–7.450)
pO2, Arterial: 112 mmHg — ABNORMAL HIGH (ref 80.0–100.0)

## 2014-05-30 LAB — POCT I-STAT, CHEM 8
BUN: 39 mg/dL — ABNORMAL HIGH (ref 6–20)
CHLORIDE: 95 mmol/L — AB (ref 101–111)
Calcium, Ion: 1.04 mmol/L — ABNORMAL LOW (ref 1.13–1.30)
Creatinine, Ser: 1.9 mg/dL — ABNORMAL HIGH (ref 0.61–1.24)
GLUCOSE: 203 mg/dL — AB (ref 65–99)
HCT: 31 % — ABNORMAL LOW (ref 39.0–52.0)
Hemoglobin: 10.5 g/dL — ABNORMAL LOW (ref 13.0–17.0)
POTASSIUM: 3.9 mmol/L (ref 3.5–5.1)
Sodium: 126 mmol/L — ABNORMAL LOW (ref 135–145)
TCO2: 16 mmol/L (ref 0–100)

## 2014-05-30 LAB — POCT ACTIVATED CLOTTING TIME
ACTIVATED CLOTTING TIME: 165 s
ACTIVATED CLOTTING TIME: 165 s
ACTIVATED CLOTTING TIME: 171 s
Activated Clotting Time: 153 seconds
Activated Clotting Time: 153 seconds
Activated Clotting Time: 159 seconds
Activated Clotting Time: 171 seconds

## 2014-05-30 LAB — CBC
HCT: 21.4 % — ABNORMAL LOW (ref 39.0–52.0)
HCT: 29.7 % — ABNORMAL LOW (ref 39.0–52.0)
Hemoglobin: 7.4 g/dL — ABNORMAL LOW (ref 13.0–17.0)
Hemoglobin: 10.2 g/dL — ABNORMAL LOW (ref 13.0–17.0)
MCH: 27.8 pg (ref 26.0–34.0)
MCH: 27.9 pg (ref 26.0–34.0)
MCHC: 34.6 g/dL (ref 30.0–36.0)
MCHC: 34.3 g/dL (ref 30.0–36.0)
MCV: 80.5 fL (ref 78.0–100.0)
MCV: 81.4 fL (ref 78.0–100.0)
Platelets: 158 10*3/uL (ref 150–400)
Platelets: 135 10*3/uL — ABNORMAL LOW (ref 150–400)
RBC: 2.66 MIL/uL — ABNORMAL LOW (ref 4.22–5.81)
RBC: 3.65 MIL/uL — ABNORMAL LOW (ref 4.22–5.81)
RDW: 17.7 % — ABNORMAL HIGH (ref 11.5–15.5)
RDW: 17 % — ABNORMAL HIGH (ref 11.5–15.5)
WBC: 45.7 10*3/uL — ABNORMAL HIGH (ref 4.0–10.5)
WBC: 49.2 10*3/uL — ABNORMAL HIGH (ref 4.0–10.5)

## 2014-05-30 LAB — COMPREHENSIVE METABOLIC PANEL
ALT: 19 U/L (ref 17–63)
AST: 26 U/L (ref 15–41)
Albumin: 1.8 g/dL — ABNORMAL LOW (ref 3.5–5.0)
Alkaline Phosphatase: 84 U/L (ref 38–126)
Anion gap: 10 (ref 5–15)
BUN: 32 mg/dL — ABNORMAL HIGH (ref 6–20)
CO2: 19 mmol/L — ABNORMAL LOW (ref 22–32)
Calcium: 6.7 mg/dL — ABNORMAL LOW (ref 8.9–10.3)
Chloride: 97 mmol/L — ABNORMAL LOW (ref 101–111)
Creatinine, Ser: 1.63 mg/dL — ABNORMAL HIGH (ref 0.61–1.24)
GFR calc Af Amer: 47 mL/min — ABNORMAL LOW (ref >60)
GFR calc non Af Amer: 41 mL/min — ABNORMAL LOW (ref >60)
Glucose, Bld: 95 mg/dL (ref 65–99)
Potassium: 3.7 mmol/L (ref 3.5–5.1)
Sodium: 126 mmol/L — ABNORMAL LOW (ref 135–145)
Total Bilirubin: 1.1 mg/dL (ref 0.3–1.2)
Total Protein: 3.9 g/dL — ABNORMAL LOW (ref 6.5–8.1)

## 2014-05-30 LAB — CARBOXYHEMOGLOBIN
Carboxyhemoglobin: 1.7 % — ABNORMAL HIGH (ref 0.5–1.5)
Methemoglobin: 1.4 % (ref 0.0–1.5)
O2 Saturation: 79.8 %
Total hemoglobin: 8 g/dL — ABNORMAL LOW (ref 13.5–18.0)

## 2014-05-30 LAB — PREPARE RBC (CROSSMATCH)

## 2014-05-30 LAB — GLUCOSE, CAPILLARY
GLUCOSE-CAPILLARY: 194 mg/dL — AB (ref 65–99)
Glucose-Capillary: 153 mg/dL — ABNORMAL HIGH (ref 65–99)
Glucose-Capillary: 261 mg/dL — ABNORMAL HIGH (ref 65–99)

## 2014-05-30 LAB — LACTATE DEHYDROGENASE: LDH: 411 U/L — ABNORMAL HIGH (ref 98–192)

## 2014-05-30 LAB — LACTIC ACID, PLASMA: Lactic Acid, Venous: 1.9 mmol/L (ref 0.5–2.0)

## 2014-05-30 LAB — CLOSTRIDIUM DIFFICILE BY PCR: Toxigenic C. Difficile by PCR: POSITIVE — AB

## 2014-05-30 SURGERY — REMOVAL, CARDIAC ASSIST DEVICE, IMPELLA
Anesthesia: General | Site: Chest

## 2014-05-30 MED ORDER — SODIUM CHLORIDE 0.9 % IV SOLN
500.0000 mg | Freq: Three times a day (TID) | INTRAVENOUS | Status: DC
  Administered 2014-05-30 (×2): 500 mg via INTRAVENOUS
  Filled 2014-05-30 (×4): qty 500

## 2014-05-30 MED ORDER — INSULIN DETEMIR 100 UNIT/ML ~~LOC~~ SOLN
20.0000 [IU] | Freq: Every day | SUBCUTANEOUS | Status: DC
  Filled 2014-05-30: qty 0.2

## 2014-05-30 MED ORDER — SODIUM CHLORIDE 0.9 % IV SOLN: Freq: Once | INTRAVENOUS | Status: DC

## 2014-05-30 MED ORDER — VANCOMYCIN 50 MG/ML ORAL SOLUTION
500.0000 mg | Freq: Four times a day (QID) | ORAL | Status: DC
  Administered 2014-05-30 – 2014-06-01 (×9): 500 mg via ORAL
  Filled 2014-05-30 (×12): qty 10

## 2014-05-30 MED ORDER — SODIUM CHLORIDE 0.9 % IV SOLN: 1.0000 g | Freq: Three times a day (TID) | INTRAVENOUS | Status: DC

## 2014-05-30 MED ORDER — ACETAMINOPHEN 10 MG/ML IV SOLN
1000.0000 mg | Freq: Four times a day (QID) | INTRAVENOUS | Status: AC
  Administered 2014-05-30 (×3): 1000 mg via INTRAVENOUS
  Filled 2014-05-30 (×3): qty 100

## 2014-05-30 MED ORDER — POTASSIUM CHLORIDE 10 MEQ/50ML IV SOLN
10.0000 meq | Freq: Once | INTRAVENOUS | Status: AC
  Administered 2014-05-30: 10 meq via INTRAVENOUS
  Filled 2014-05-30: qty 50

## 2014-05-30 MED ORDER — PANTOPRAZOLE SODIUM 40 MG IV SOLR
40.0000 mg | Freq: Two times a day (BID) | INTRAVENOUS | Status: DC
  Administered 2014-05-30 – 2014-06-01 (×5): 40 mg via INTRAVENOUS
  Filled 2014-05-30 (×5): qty 40

## 2014-05-30 MED ORDER — SODIUM CHLORIDE 0.9 % IJ SOLN
10.0000 mL | Freq: Two times a day (BID) | INTRAMUSCULAR | Status: DC
  Administered 2014-05-30: 10 mL via INTRAVENOUS

## 2014-05-30 MED ORDER — POTASSIUM CHLORIDE 10 MEQ/50ML IV SOLN
10.0000 meq | INTRAVENOUS | Status: DC | PRN
  Filled 2014-05-30 (×4): qty 50

## 2014-05-30 MED ORDER — METRONIDAZOLE 250 MG PO TABS: 250.0000 mg | ORAL_TABLET | Freq: Four times a day (QID) | ORAL | Status: DC

## 2014-05-30 MED ORDER — FUROSEMIDE 10 MG/ML IJ SOLN
40.0000 mg | Freq: Once | INTRAMUSCULAR | Status: AC
  Administered 2014-05-30: 40 mg via INTRAVENOUS
  Filled 2014-05-30: qty 4

## 2014-05-30 MED ORDER — METRONIDAZOLE 50 MG/ML ORAL SUSPENSION
500.0000 mg | Freq: Three times a day (TID) | ORAL | Status: DC
  Administered 2014-05-30 – 2014-06-01 (×7): 500 mg via ORAL
  Filled 2014-05-30 (×9): qty 10

## 2014-05-30 MED ORDER — PIPERACILLIN-TAZOBACTAM 3.375 G IVPB
3.3750 g | Freq: Three times a day (TID) | INTRAVENOUS | Status: DC
  Administered 2014-05-30 – 2014-06-01 (×6): 3.375 g via INTRAVENOUS
  Filled 2014-05-30 (×10): qty 50

## 2014-05-30 MED ORDER — VASOPRESSIN 20 UNIT/ML IV SOLN
0.0300 [IU]/min | INTRAVENOUS | Status: DC
  Administered 2014-05-30: 0.03 [IU]/min via INTRAVENOUS
  Administered 2014-06-01 (×2): 0.05 [IU]/min via INTRAVENOUS
  Filled 2014-05-30 (×5): qty 2

## 2014-05-30 MED ORDER — VANCOMYCIN HCL 10 G IV SOLR
1250.0000 mg | INTRAVENOUS | Status: DC
  Administered 2014-05-30 – 2014-05-31 (×2): 1250 mg via INTRAVENOUS
  Filled 2014-05-30 (×3): qty 1250

## 2014-05-30 MED ORDER — FLUCONAZOLE IN SODIUM CHLORIDE 400-0.9 MG/200ML-% IV SOLN
400.0000 mg | INTRAVENOUS | Status: DC
  Administered 2014-05-30: 400 mg via INTRAVENOUS
  Filled 2014-05-30: qty 200

## 2014-05-30 NOTE — Progress Notes (Signed)
Awaiting callback. Md paged

## 2014-05-30 NOTE — Progress Notes (Signed)
Advanced Heart Failure Rounding Note   Subjective:    Underwent CABG and Impella 5.0 placement on 5/12  Became septic last night. Hypotensive. Bicarb down WBC up to 36K -> 46k. Ceftaz swithced to meropenem. Fluconazole added. Vasopressin added. Now with profuse diarrhea. C. Diff pending.   Remains intubated and sedated. 40% FiO2. Dobutamine at 2.5. Levophed at 30. Vasopressin.  On lido 1 and amio 60. No further VT.  Impella waveforms look good. CVP 12. Weight up 9 pounds. Co-ox 73%    Objective:    Vital Signs:   Temp:  [96.8 F (36 C)-101.6 F (38.7 C)] 101.1 F (38.4 C) (05/19 0745) Pulse Rate:  [25-93] 86 (05/19 0800) Resp:  [17-33] 31 (05/19 0800) BP: (61-145)/(27-79) 97/65 mmHg (05/19 0800) SpO2:  [95 %-100 %] 99 % (05/19 0800) FiO2 (%):  [40 %] 40 % (05/19 0348) Weight:  [109.2 kg (240 lb 11.9 oz)] 109.2 kg (240 lb 11.9 oz) (05/19 0300) Last BM Date: 05/29/14  Weight change: Filed Weights   05/28/14 0233 05/29/14 0544 05/14/2014 0300  Weight: 99.2 kg (218 lb 11.1 oz) 104.781 kg (231 lb) 109.2 kg (240 lb 11.9 oz)    Intake/Output:   Intake/Output Summary (Last 24 hours) at 05/12/2014 0843 Last data filed at 05/24/2014 0800  Gross per 24 hour  Intake 7033.21 ml  Output   1745 ml  Net 5288.21 ml     Physical Exam: General: Intubated sedated HEENT: normal Neck: supple. RIJ swan. No lymphadenopathy or thryomegaly appreciated. Cor: R subclavian Impella sheath. Sternal dressing Regular rhythm, controlled rate. impella hum Lungs: clear Abdomen: soft, nontender, minimally distended. No hepatosplenomegaly. No bruits or masses. hypoactive bowel sounds. Extremities: no cyanosis, clubbing, rash,  R and LLE SCDs. 3+ edema. Rectal tube Neuro: intubated sedated  Telemetry: NSR and AV pacing   Labs: Basic Metabolic Panel:  Recent Labs Lab 05/25/14 0500  05/26/14 0320 05/26/14 0326  05/26/14 1330  05/27/14 0252  05/28/14 0408  05/29/14 0020 05/29/14 0300  05/29/14 0833 05/29/14 1545 06/05/2014 0445  NA  --   < > 126*  --   < >  --   < > 129*  < > 132*  < > 129* 128* 129* 128* 126*  K  --   < > 3.4*  --   < > 3.7  < > 3.6  < > 3.4*  < > 3.8 3.9 3.9 3.7 3.7  CL  --   < > 94*  --   < >  --   < > 96*  < > 104  < > 93* 97* 92* 93* 97*  CO2  --   --  24  --   --   --   --  26  --  22  --   --  25  --   --  19*  GLUCOSE  --   < > 93  --   < >  --   < > 106*  < > 150*  < > 217* 203* 179* 185* 95  BUN  --   < > 16  --   < >  --   < > 15  < > 14  < > 18 18 19  23* 32*  CREATININE  --   < > 1.36*  --   < >  --   < > 1.30*  < > 1.08  < > 1.30* 1.33* 1.20 1.40* 1.63*  CALCIUM  --   --  7.7*  --   --   --   --  7.5*  --  6.5*  --   --  7.3*  --   --  6.7*  MG 1.8  --   --  1.7  --  2.2  --  1.7  --   --   --   --  1.6*  --   --   --   PHOS  --   --   --   --   --   --   --   --   --   --   --   --  2.7  --   --   --   < > = values in this interval not displayed.  Liver Function Tests:  Recent Labs Lab 05/26/14 0320 05/27/14 0252 05/28/14 0408 05/29/14 0300 05/19/2014 0445  AST 28 21 24 26 26   ALT 27 23 19 23 19   ALKPHOS 75 83 85 107 84  BILITOT 1.3* 1.1 0.7 0.8 1.1  PROT 4.6* 4.4* 3.7* 4.3* 3.9*  ALBUMIN 2.3* 2.1* 1.7* 1.7* 1.8*   CBC:  Recent Labs Lab 05/28/14 0408  05/28/14 1548  05/29/14 0300 05/29/14 0833 05/29/14 1545 05/29/14 2140 05/15/2014 0445  WBC 12.4*  --  19.2*  --  22.5*  --   --  36.0* 45.7*  HGB 6.2*  < > 8.6*  < > 7.6* 8.2* 10.2* 9.0* 7.4*  HCT 18.5*  < > 25.6*  < > 22.9* 24.0* 30.0* 26.2* 21.4*  MCV 79.4  --  78.8  --  80.1  --   --  80.1 80.5  PLT 59*  --  84*  --  108*  --   --  144* 158  < > = values in this interval not displayed.  Cardiac Enzymes: No results for input(s): CKTOTAL, CKMB, CKMBINDEX, TROPONINI in the last 168 hours.  BNP: BNP (last 3 results)  Recent Labs  06/08/2014 1630  BNP 885.0*    ProBNP (last 3 results) No results for input(s): PROBNP in the last 8760 hours.    Other  results:  Imaging: Dg Chest Port 1 View  05/17/2014   CLINICAL DATA:  Status post coronary bypass grafting  EXAM: PORTABLE CHEST - 1 VIEW  COMPARISON:  05/29/2014  FINDINGS: Cardiac shadow is mildly enlarged. Right jugular central venous sheath is again noted and stable. The Swan-Ganz catheter has been removed. A right-sided PICC line is noted in the mid superior vena cava and stable. The endotracheal tube is stable in appearance 3.2 cm above the carina. A nasogastric catheter is noted within the stomach. An Impella assist device is noted in satisfactory position. The lungs are well aerated bilaterally. Mild vascular congestion is seen although improved aeration is noted in the bases bilaterally.  IMPRESSION: Persistent vascular congestion with improved overall aeration.  Tubes and lines as described stable in appearance.  Interval removal of Swan-Ganz catheter.   Electronically Signed   By: Inez Catalina M.D.   On: 06/06/2014 07:37   Dg Chest Port 1 View  05/29/2014   CLINICAL DATA:  CABG.  EXAM: PORTABLE CHEST - 1 VIEW  COMPARISON:  05/28/2014.  FINDINGS: Endotracheal tube, left chest tube, Swan-Ganz catheter, right PICC line, mediastinal drainage catheter in stable position. Stable cardiomegaly. Diffuse increase bilateral pulmonary infiltrates consistent with pulmonary edema. Pneumonia cannot be excluded. Tiny pleural effusions cannot be excluded. No pneumothorax.  IMPRESSION: 1. Lines and tubes including left chest tube in stable position. No pneumothorax. 2. Cardiomegaly. Prior CABG. Diffuse increase bilateral pulmonary infiltrates. Small pleural  effusions cannot be excluded. These findings suggest congestive heart failure.   Electronically Signed   By: Marcello Moores  Register   On: 05/29/2014 07:33     Medications:     Scheduled Medications: . sodium chloride   Intravenous Once  . sodium chloride   Intravenous Once  . antiseptic oral rinse  7 mL Mouth Rinse QID  . aspirin EC  325 mg Oral Daily   Or   . aspirin  324 mg Per Tube Daily  . bisacodyl  10 mg Oral Daily   Or  . bisacodyl  10 mg Rectal Daily  . chlorhexidine  15 mL Mouth Rinse BID  . docusate sodium  200 mg Oral Daily  . feeding supplement (PRO-STAT SUGAR FREE 64)  30 mL Per Tube 5 X Daily  . feeding supplement (VITAL HIGH PROTEIN)  1,000 mL Per Tube Q24H  . fluconazole (DIFLUCAN) IV  400 mg Intravenous Q24H  . imipenem-cilastatin  500 mg Intravenous 3 times per day  . insulin aspart  0-24 Units Subcutaneous 6 times per day  . insulin detemir  30 Units Subcutaneous Daily  . pantoprazole (PROTONIX) IV  40 mg Intravenous Q24H  . sodium chloride  3 mL Intravenous Q12H  . vancomycin  750 mg Intravenous Q12H    Infusions: . sodium chloride Stopped (05/27/14 0120)  . sodium chloride    . sodium chloride 20 mL/hr at 06/03/2014 0600  . amiodarone 30 mg/hr (06/03/2014 2200)  . amiodarone 60 mg/hr (06/09/2014 0600)  . dexmedetomidine 0.698 mcg/kg/hr (05/20/2014 0600)  . dextrose 5 % Impella 5.0 Purge solution    . DOBUTamine 2.5 mcg/kg/min (06/08/2014 0600)  . impella catheter heparin 50 unit/mL in dextrose 5%    . heparin Stopped (06/05/2014 0600)  . lactated ringers Stopped (05/24/14 0400)  . lactated ringers 10 mL/hr at 05/13/2014 0600  . lidocaine 1 mg/min (06/02/2014 0600)  . midazolam (VERSED) infusion 2 mg/hr (05/29/2014 0600)  . norepinephrine (LEVOPHED) Adult infusion 34.987 mcg/min (05/19/2014 0600)  . vasopressin (PITRESSIN) infusion - *FOR SHOCK* 0.03 Units/min (06/06/2014 0600)    PRN Medications: sodium chloride, acetaminophen (TYLENOL) oral liquid 160 mg/5 mL, levalbuterol, morphine injection, ondansetron (ZOFRAN) IV, oxyCODONE, potassium chloride, sodium chloride, traMADol   Assessment:   1. Cardiogenic shock 2. Ventricular fibrillation arrest 5/6 in setting of milrinone use 3. 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 3v CAD/NSTEMI:  Occluded RCA, subtotally occluded prox LAD, 90% LCx 5. DM2 6. Atrial fibrillation, newly diagnosed on admission -> NSR due to defib 5/10 7. Microcytic anemia/thrombocytopenia  8. Acute kidney injury, 9. 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 Thrombocytopenia 11. Acute delirium 12. Hyponatremia 13. NSVT 14. Septic shock 5/19 15. Diarrhea   Plan/Discussion:    S/p  CABG with Impella 5.0 support 05/17/2014.   Main issue now is septic shock. Suspect c.diff. Will start empiric flagyl. Continue with other broad spectrum coverage. Bcx remain negative   Impella waveforms look good. Flow on pump 4.5.  Remains at P-8 support.  Unable to wean at this time. Continue dobutamine, levophed and vasopressin support. Wean levophed as tolerated. Hold lasix today despite marked edema.   VT improved. Suspect scar-mediated VT. Continue lidocaine for now + amio 60 mg per hour.  K and mag supplemented.  Will need eventual wean off lidocaine onto mexiletine or ranolazine.    Getting RBCs.    The patient is critically ill with multiple  organ systems failure and requires high complexity decision making for assessment and support, frequent evaluation and titration of therapies, application of advanced monitoring technologies and extensive interpretation of multiple databases.   Critical Care Time devoted to patient care services described in this note is 45 Minutes.    Jeffrey [name] 06/07/2014 8:43 AM

## 2014-05-30 NOTE — Progress Notes (Signed)
[facility] BRIEF SICU PROGRESS NOTE  7 Days Post-Op  S/P Procedure(s) (LRB): CORONARY ARTERY BYPASS GRAFTING (CABG) x  four, using left internal mammary artery and right leg greater saphenous vein harvested endoscopically (N/A) PLACEMENT OF IMPELLA 5.0 LEFT VENTRICULAR ASSIST DEVICE (N/A) TRANSESOPHAGEAL ECHOCARDIOGRAM (TEE) (N/A)   Sedated on vent In and out of Afib/Aflutter w/ controlled rate Impella flows stable > 4 L/min on P8, MAP stable w/ Levophed @ 40, Vasopressin @ 0.3 O2 sats 99-100% on 40% FiO2 Afebrile last 8 hours UOP adequate  Plan: Continue current plan  Rexene Alberts 05/24/2014 7:22 PM

## 2014-05-30 NOTE — Progress Notes (Deleted)
Pt with temp of 101.1. Md paged awaiting call back

## 2014-05-30 NOTE — Progress Notes (Signed)
Dr. Haroldine Laws notified of CBC, Coox and Hypotension. Orders received.

## 2014-05-30 NOTE — Progress Notes (Signed)
Un able to maintain hemodynamics with weaning of impella. Dr Servando Snare notified of frequent liquid stools. Orders received. Will place pt on contact precautions and send stool sample to lab.

## 2014-05-30 NOTE — Progress Notes (Signed)
Pharmacy Consult Note  Pharmacy Consult for heparin Indication: Impella 5.0  Pharmacy Consult for Vancomycin, Zosyn, Flagyl Indication: sepsis  Allergies  Allergen Reactions  . Ambien [Zolpidem Tartrate] Other (See Comments)    Agitated delirium secondary to Ambien administration on 05/21/14  . Penicillins Rash    Patient Measurements: Height: 5\' 9"  (175.3 cm) Weight: 240 lb 11.9 oz (109.2 kg) IBW/kg (Calculated) : 70.7 Heparin Dosing Weight: 88kg  Vital Signs: Temp: 99.4 F (37.4 C) (05/19 1015) Temp Source: Axillary (05/19 1015) BP: 98/63 mmHg (05/19 1000) Pulse Rate: 31 (05/19 1015)  Labs:  Recent Labs  05/28/14 0408  05/29/14 0300 05/29/14 0833 05/29/14 1545 05/29/14 2140 05/14/2014 0445  HGB 6.2*  < > 7.6* 8.2* 10.2* 9.0* 7.4*  HCT 18.5*  < > 22.9* 24.0* 30.0* 26.2* 21.4*  PLT 59*  < > 108*  --   --  144* 158  APTT >200*  --  >200*  --   --   --  >200*  HEPARINUNFRC 0.11*  --   --   --   --   --   --   CREATININE 1.08  < > 1.33* 1.20 1.40*  --  1.63*  < > = values in this interval not displayed.  Estimated Creatinine Clearance: 50.6 mL/min (by C-G formula based on Cr of 1.63).   Medical History: Past Medical History  Diagnosis Date  . Type 2 diabetes mellitus   . Hypothyroidism   . Hyperlipidemia   . Essential hypertension   . Insomnia   . Depression   . Atrial fibrillation     Diagnosed May 2016    Assessment: 74 yo male s/p CABG on 5/12 and on Impella 5.0.  Anticoagulation: He is on heparin through the purge at 13 ml/hr (650 units/hr of heparin) and also on systemic heparin at 1500 units/hr (total heparin = 2150units/hr)   -hourly ACT trend:165-171.   Hgb dropped to 7.4 this am - will transfuse blood, pltc has responded to 158 this am.  Infectious Disease/Sepsis - empiric cefepime prior to CABG, started fortaz 5/16, sepsis worsened overnight and abx broadened with primaxin/fluconazole. Now + for cdiff and flagyl and po vanc added. Tmax 101.6,  wbc 45. Scr has worsened  Limit abx exposure as able  5/9 Cefepime >> 5/12 5/16 fortaz>>5/19 5/18 vanc>> 5/19 primaxin>>5/19 5/19 zosyn>> 5/19 fluconazole> 5/19 5/19 flagyl>> 5/19 po vanc>>  5/12 resp- ngF 5/11 cdiff negative 5/19 cdiff - + 5/18 bld x 2 -ngtd   Goal of Therapy:  ACT= 160-180 Vancomycin trough 15-20 Monitor platelets by anticoagulation protocol: Yes   Plan:  -systemic heparin currently at 1500 units/hr (Heparin rate range of 1000-1500 units/hr seems appropriate) -Hourly ACTs -Reduce vanc to 1250mg  IV q24 hours -Zosyn 3.375g IV q8 hours -D/c fcz/primaxin -Add po vanc x 14d  Erin Hearing PharmD., BCPS Clinical Pharmacist Pager [phone] 06/08/2014 10:30 AM

## 2014-05-30 NOTE — Progress Notes (Signed)
Pt with temp 101.1. Md paged awaiting call back.

## 2014-05-30 NOTE — Progress Notes (Signed)
7 Days Post-Op Procedure(s) (LRB): CORONARY ARTERY BYPASS GRAFTING (CABG) x  four, using left internal mammary artery and right leg greater saphenous vein harvested endoscopically (N/A) PLACEMENT OF IMPELLA 5.0 LEFT VENTRICULAR ASSIST DEVICE (N/A) TRANSESOPHAGEAL ECHOCARDIOGRAM (TEE) (N/A) Subjective: Patient febrile, recently diagnosed with C. Difficile colitis Not ready for removal of LVAD today Central line changed, new triple-lumen catheter placed left subclavian vein Received 4 units packed cells last 24 hours from  Anticoagulation-heparin currently on hold Objective: Vital signs in last 24 hours: Temp:  [97.4 F (36.3 C)-101.6 F (38.7 C)] 97.4 F (36.3 C) (05/19 1145) Pulse Rate:  [25-93] 73 (05/19 1506) Cardiac Rhythm:  [-] Normal sinus rhythm;Atrial fibrillation (05/19 0800) Resp:  [19-34] 32 (05/19 1506) BP: (61-129)/(39-79) 86/71 mmHg (05/19 1700) SpO2:  [87 %-100 %] 99 % (05/19 1506) FiO2 (%):  [40 %] 40 % (05/19 1506) Weight:  [240 lb 11.9 oz (109.2 kg)] 240 lb 11.9 oz (109.2 kg) (05/19 0300)  Hemodynamic parameters for last 24 hours: CVP:  [7 mmHg-11 mmHg] 10 mmHg  Intake/Output from previous day: 05/18 0701 - 05/19 0700 In: 7828.2 [I.V.:3949.6; Blood:660; NG/GT:1325.3; IV Piggyback:1550] Out: 2015 [Urine:1095; Emesis/NG output:150; Stool:200; Chest Tube:570] Intake/Output this shift: Total I/O In: 2230 [I.V.:629.6; Blood:670; Other:130.4; NG/GT:350; IV Piggyback:450] Out: 630 [Urine:310; Emesis/NG output:50; Stool:200; Chest Tube:70]  Edematous Neuro intact  Lab Results:  Recent Labs  05/29/2014 0445 06/02/2014 1245 06/04/2014 1350  WBC 45.7* 49.2*  --   HGB 7.4* 10.2* 10.5*  HCT 21.4* 29.7* 31.0*  PLT 158 135*  --    BMET:  Recent Labs  05/29/14 0300  05/19/2014 0445 05/28/2014 1350  NA 128*  < > 126* 126*  K 3.9  < > 3.7 3.9  CL 97*  < > 97* 95*  CO2 25  --  19*  --   GLUCOSE 203*  < > 95 203*  BUN 18  < > 32* 39*  CREATININE 1.33*  < > 1.63* 1.90*   CALCIUM 7.3*  --  6.7*  --   < > = values in this interval not displayed.  PT/INR: No results for input(s): LABPROT, INR in the last 72 hours. ABG    Component Value Date/Time   PHART 7.475* 05/17/2014 0357   HCO3 20.3 06/07/2014 0357   TCO2 16 05/29/2014 1350   ACIDBASEDEF 2.8* 05/24/2014 0357   O2SAT 79.8 05/25/2014 0500   CBG (last 3)   Recent Labs  05/26/2014 0021 06/08/2014 0803 05/15/2014 1225  GLUCAP 261* 153* 194*    Assessment/Plan: S/P Procedure(s) (LRB): CORONARY ARTERY BYPASS GRAFTING (CABG) x  four, using left internal mammary artery and right leg greater saphenous vein harvested endoscopically (N/A) PLACEMENT OF IMPELLA 5.0 LEFT VENTRICULAR ASSIST DEVICE (N/A) TRANSESOPHAGEAL ECHOCARDIOGRAM (TEE) (N/A) Will attempt to wean LVAD flow rates and possibly remove in OR tomorrow   LOS: 16 days    Jeffrey [name] 05/25/2014

## 2014-05-30 NOTE — Progress Notes (Signed)
ANTIBIOTIC CONSULT NOTE - INITIAL  Pharmacy Consult for Primaxin and Fluconazole Indication: Sepsis  Allergies  Allergen Reactions  . Ambien [Zolpidem Tartrate] Other (See Comments)    Agitated delirium secondary to Ambien administration on 05/21/14  . Penicillins Rash    Patient Measurements: Height: 5\' 9"  (175.3 cm) Weight: 231 lb (104.781 kg) IBW/kg (Calculated) : 70.7   Vital Signs: Temp: 96.8 F (36 C) (05/18 1654) Temp Source: Axillary (05/18 1654) BP: 61/39 mmHg (05/19 0100) Pulse Rate: 41 (05/19 0100) Intake/Output from previous day: 05/18 0701 - 05/19 0700 In: 0762 [I.V.:2720.2; Blood:660; NG/GT:1015.3; IV [id number]:2563] Out: 8937 [Urine:925; Emesis/NG output:100; Chest Tube:570] Intake/Output from this shift: Total I/O In: 1988.2 [I.V.:864.5; Other:73.7; NG/GT:400; IV Piggyback:650] Out: 335 [Urine:235; Emesis/NG output:50; Chest Tube:50]  Labs:  Recent Labs  05/28/14 1548  05/29/14 0300 05/29/14 0833 05/29/14 1545 05/29/14 2140  WBC 19.2*  --  22.5*  --   --  36.0*  HGB 8.6*  < > 7.6* 8.2* 10.2* 9.0*  PLT 84*  --  108*  --   --  144*  CREATININE  --   < > 1.33* 1.20 1.40*  --   < > = values in this interval not displayed. Estimated Creatinine Clearance: 57.7 mL/min (by C-G formula based on Cr of 1.4). No results for input(s): VANCOTROUGH, VANCOPEAK, VANCORANDOM, GENTTROUGH, GENTPEAK, GENTRANDOM, TOBRATROUGH, TOBRAPEAK, TOBRARND, AMIKACINPEAK, AMIKACINTROU, AMIKACIN in the last 72 hours.   Microbiology: Recent Results (from the past 720 hour(s))  MRSA PCR Screening     Status: None   Collection Time: 05/12/2014  9:36 PM  Result Value Ref Range Status   MRSA by PCR NEGATIVE NEGATIVE Final    Comment:        The GeneXpert MRSA Assay (FDA approved for NASAL specimens only), is one component of a comprehensive MRSA colonization surveillance program. It is not intended to diagnose MRSA infection nor to guide or monitor treatment for MRSA  infections.   Surgical pcr screen     Status: None   Collection Time: 05/21/14  9:43 AM  Result Value Ref Range Status   MRSA, PCR NEGATIVE NEGATIVE Final   Staphylococcus aureus NEGATIVE NEGATIVE Final    Comment:        The Xpert SA Assay (FDA approved for NASAL specimens in patients over 72 years of age), is one component of a comprehensive surveillance program.  Test performance has been validated by [facility] for patients greater than or equal to 55 year old. It is not intended to diagnose infection nor to guide or monitor treatment.   Clostridium Difficile by PCR     Status: None   Collection Time: 05/22/14  8:22 AM  Result Value Ref Range Status   C difficile by pcr NEGATIVE NEGATIVE Final  Culture, respiratory (NON-Expectorated)     Status: None   Collection Time: 05/22/2014  5:53 PM  Result Value Ref Range Status   Specimen Description TRACHEAL ASPIRATE  Final   Special Requests NONE  Final   Gram Stain   Final    FEW WBC PRESENT, PREDOMINANTLY PMN NO SQUAMOUS EPITHELIAL CELLS SEEN NO ORGANISMS SEEN Performed at [facility]    Culture   Final    Non-Pathogenic Oropharyngeal-type Flora Isolated. Performed at [facility]    Report Status 05/26/2014 FINAL  Final    Medical History: Past Medical History  Diagnosis Date  . Type 2 diabetes mellitus   . Hypothyroidism   . Hyperlipidemia   . Essential hypertension   .  Insomnia   . Depression   . Atrial fibrillation     Diagnosed May 2016    Medications:  Scheduled:  . sodium chloride   Intravenous Once  . antiseptic oral rinse  7 mL Mouth Rinse QID  . aspirin EC  325 mg Oral Daily   Or  . aspirin  324 mg Per Tube Daily  . bisacodyl  10 mg Oral Daily   Or  . bisacodyl  10 mg Rectal Daily  . chlorhexidine  15 mL Mouth Rinse BID  . docusate sodium  200 mg Oral Daily  . feeding supplement (PRO-STAT SUGAR FREE 64)  30 mL Per Tube 5 X Daily  . feeding supplement (VITAL HIGH PROTEIN)   1,000 mL Per Tube Q24H  . fluconazole (DIFLUCAN) IV  400 mg Intravenous Q24H  . imipenem-cilastatin  500 mg Intravenous 3 times per day  . insulin aspart  0-24 Units Subcutaneous 6 times per day  . insulin detemir  30 Units Subcutaneous Daily  . pantoprazole (PROTONIX) IV  40 mg Intravenous Q24H  . sodium chloride  3 mL Intravenous Q12H  . vancomycin  750 mg Intravenous Q12H   Infusions:  . sodium chloride Stopped (05/27/14 0120)  . sodium chloride    . sodium chloride 20 mL/hr at 06/10/2014 0000  . amiodarone 30 mg/hr (05/21/2014 2200)  . amiodarone 60 mg/hr (05/22/2014 0000)  . dexmedetomidine 0.698 mcg/kg/hr (06/11/2014 0000)  . dextrose 5 % Impella 5.0 Purge solution    . DOBUTamine 2.5 mcg/kg/min (05/21/2014 0000)  . impella catheter heparin 50 unit/mL in dextrose 5%    . heparin 1,500 Units/hr (05/16/2014 0000)  . lactated ringers Stopped (05/24/14 0400)  . lactated ringers 10 mL/hr at 06/08/2014 0000  . lidocaine 1 mg/min (05/31/2014 0000)  . midazolam (VERSED) infusion 2 mg/hr (05/15/2014 0000)  . norepinephrine (LEVOPHED) Adult infusion 40 mcg/min (06/07/2014 0000)   Assessment: 74 y.o male s/p CABG on 5/12 and on Impella 5.0. Pharmacy consulted to dose primaxin and fluconazole for sepsis. IV vancomycin was started earlier today.  SCr 1.4, CrCl ~ 57 ml/min. WBC 36K  Plan:  Primaxin 500 mg IV q8h Fluconazole 400 mg IV q24h Continue on vancomycin 750 mg IV q12h Monitor clinical status, renal function, culture results daily.   Thank you for allowing pharmacy to be part of this patients care team. Nicole Cella, RPh Clinical Pharmacist Pager: [phone] 05/15/2014,1:08 AM

## 2014-05-30 NOTE — Progress Notes (Signed)
Dr Cyndia Bent notified pt temp 101.1. Orders received to administer tylenol and transfuse. Blood bank consulted. Will ensure blood administration within 4 hours of issue.

## 2014-05-31 ENCOUNTER — Inpatient Hospital Stay (HOSPITAL_COMMUNITY): Payer: Commercial Managed Care - HMO

## 2014-05-31 ENCOUNTER — Inpatient Hospital Stay (HOSPITAL_COMMUNITY): Payer: Commercial Managed Care - HMO | Admitting: Certified Registered Nurse Anesthetist

## 2014-05-31 ENCOUNTER — Encounter (HOSPITAL_COMMUNITY): Payer: Self-pay | Admitting: Certified Registered Nurse Anesthetist

## 2014-05-31 ENCOUNTER — Encounter (HOSPITAL_COMMUNITY): Admission: EM | Disposition: E | Payer: Self-pay | Source: Home / Self Care | Attending: Cardiothoracic Surgery

## 2014-05-31 DIAGNOSIS — I97618 Postprocedural hemorrhage and hematoma of a circulatory system organ or structure following other circulatory system procedure: Secondary | ICD-10-CM

## 2014-05-31 DIAGNOSIS — I25110 Atherosclerotic heart disease of native coronary artery with unstable angina pectoris: Secondary | ICD-10-CM

## 2014-05-31 HISTORY — PX: INCISION AND DRAINAGE OF WOUND: SHX1803

## 2014-05-31 HISTORY — PX: TEE WITHOUT CARDIOVERSION: SHX5443

## 2014-05-31 HISTORY — PX: REMOVAL OF IMPELLA LEFT VENTRICULAR ASSIST DEVICE: SHX6556

## 2014-05-31 HISTORY — PX: HEMATOMA EVACUATION: SHX5118

## 2014-05-31 HISTORY — PX: CARDIAC CATHETERIZATION: SHX172

## 2014-05-31 LAB — POCT I-STAT 3, ART BLOOD GAS (G3+)
ACID-BASE DEFICIT: 8 mmol/L — AB (ref 0.0–2.0)
Acid-base deficit: 10 mmol/L — ABNORMAL HIGH (ref 0.0–2.0)
Acid-base deficit: 14 mmol/L — ABNORMAL HIGH (ref 0.0–2.0)
Acid-base deficit: 11 mmol/L — ABNORMAL HIGH (ref 0.0–2.0)
Bicarbonate: 15.7 mEq/L — ABNORMAL LOW (ref 20.0–24.0)
Bicarbonate: 17.4 mEq/L — ABNORMAL LOW (ref 20.0–24.0)
Bicarbonate: 14 mEq/L — ABNORMAL LOW (ref 20.0–24.0)
Bicarbonate: 14.2 mEq/L — ABNORMAL LOW (ref 20.0–24.0)
O2 SAT: 98 %
O2 Saturation: 98 %
O2 Saturation: 89 %
O2 Saturation: 93 %
PCO2 ART: 25.6 mmHg — AB (ref 35.0–45.0)
PCO2 ART: 40.8 mmHg (ref 35.0–45.0)
PCO2 ART: 27.4 mmHg — AB (ref 35.0–45.0)
PH ART: 7.142 — AB (ref 7.350–7.450)
PO2 ART: 68 mmHg — AB (ref 80.0–100.0)
PO2 ART: 110 mmHg — AB (ref 80.0–100.0)
Patient temperature: 97.8
Patient temperature: 36.8
Patient temperature: 36.8
TCO2: 17 mmol/L (ref 0–100)
TCO2: 19 mmol/L (ref 0–100)
TCO2: 15 mmol/L (ref 0–100)
TCO2: 15 mmol/L (ref 0–100)
pCO2 arterial: 44.4 mmHg (ref 35.0–45.0)
pH, Arterial: 7.395 (ref 7.350–7.450)
pH, Arterial: 7.201 — ABNORMAL LOW (ref 7.350–7.450)
pH, Arterial: 7.322 — ABNORMAL LOW (ref 7.350–7.450)
pO2, Arterial: 104 mmHg — ABNORMAL HIGH (ref 80.0–100.0)
pO2, Arterial: 87 mmHg (ref 80.0–100.0)

## 2014-05-31 LAB — CULTURE, RESPIRATORY

## 2014-05-31 LAB — CULTURE, RESPIRATORY W GRAM STAIN

## 2014-05-31 LAB — BASIC METABOLIC PANEL
Anion gap: 9 (ref 5–15)
BUN: 48 mg/dL — ABNORMAL HIGH (ref 6–20)
CO2: 21 mmol/L — ABNORMAL LOW (ref 22–32)
Calcium: 6.8 mg/dL — ABNORMAL LOW (ref 8.9–10.3)
Chloride: 97 mmol/L — ABNORMAL LOW (ref 101–111)
Creatinine, Ser: 1.84 mg/dL — ABNORMAL HIGH (ref 0.61–1.24)
GFR calc Af Amer: 41 mL/min — ABNORMAL LOW (ref >60)
GFR calc non Af Amer: 35 mL/min — ABNORMAL LOW (ref >60)
Glucose, Bld: 111 mg/dL — ABNORMAL HIGH (ref 65–99)
Potassium: 3.7 mmol/L (ref 3.5–5.1)
Sodium: 127 mmol/L — ABNORMAL LOW (ref 135–145)

## 2014-05-31 LAB — COMPREHENSIVE METABOLIC PANEL
ALT: 21 U/L (ref 17–63)
AST: 33 U/L (ref 15–41)
Albumin: 1.4 g/dL — ABNORMAL LOW (ref 3.5–5.0)
Alkaline Phosphatase: 96 U/L (ref 38–126)
Anion gap: 8 (ref 5–15)
BUN: 47 mg/dL — ABNORMAL HIGH (ref 6–20)
CO2: 18 mmol/L — ABNORMAL LOW (ref 22–32)
Calcium: 6.8 mg/dL — ABNORMAL LOW (ref 8.9–10.3)
Chloride: 98 mmol/L — ABNORMAL LOW (ref 101–111)
Creatinine, Ser: 1.93 mg/dL — ABNORMAL HIGH (ref 0.61–1.24)
GFR calc Af Amer: 39 mL/min — ABNORMAL LOW (ref >60)
GFR calc non Af Amer: 33 mL/min — ABNORMAL LOW (ref >60)
Glucose, Bld: 60 mg/dL — ABNORMAL LOW (ref 65–99)
Potassium: 3.7 mmol/L (ref 3.5–5.1)
Sodium: 124 mmol/L — ABNORMAL LOW (ref 135–145)
Total Bilirubin: 1.1 mg/dL (ref 0.3–1.2)
Total Protein: 3.7 g/dL — ABNORMAL LOW (ref 6.5–8.1)

## 2014-05-31 LAB — CBC
HCT: 30.5 % — ABNORMAL LOW (ref 39.0–52.0)
HCT: 27.6 % — ABNORMAL LOW (ref 39.0–52.0)
HCT: 28.8 % — ABNORMAL LOW (ref 39.0–52.0)
HEMOGLOBIN: 9.9 g/dL — AB (ref 13.0–17.0)
Hemoglobin: 10.9 g/dL — ABNORMAL LOW (ref 13.0–17.0)
Hemoglobin: 9.6 g/dL — ABNORMAL LOW (ref 13.0–17.0)
MCH: 28.5 pg (ref 26.0–34.0)
MCH: 27.8 pg (ref 26.0–34.0)
MCH: 27.5 pg (ref 26.0–34.0)
MCHC: 35.7 g/dL (ref 30.0–36.0)
MCHC: 34.8 g/dL (ref 30.0–36.0)
MCHC: 34.4 g/dL (ref 30.0–36.0)
MCV: 79.6 fL (ref 78.0–100.0)
MCV: 80 fL (ref 78.0–100.0)
MCV: 80 fL (ref 78.0–100.0)
Platelets: 140 10*3/uL — ABNORMAL LOW (ref 150–400)
Platelets: 132 10*3/uL — ABNORMAL LOW (ref 150–400)
Platelets: 133 10*3/uL — ABNORMAL LOW (ref 150–400)
RBC: 3.83 MIL/uL — ABNORMAL LOW (ref 4.22–5.81)
RBC: 3.45 MIL/uL — ABNORMAL LOW (ref 4.22–5.81)
RBC: 3.6 MIL/uL — ABNORMAL LOW (ref 4.22–5.81)
RDW: 17.2 % — ABNORMAL HIGH (ref 11.5–15.5)
RDW: 17.3 % — ABNORMAL HIGH (ref 11.5–15.5)
RDW: 17.2 % — ABNORMAL HIGH (ref 11.5–15.5)
WBC: 65.8 10*3/uL (ref 4.0–10.5)
WBC: 68.1 10*3/uL (ref 4.0–10.5)
WBC: 62.1 10*3/uL (ref 4.0–10.5)

## 2014-05-31 LAB — GLUCOSE, CAPILLARY
GLUCOSE-CAPILLARY: 76 mg/dL (ref 65–99)
Glucose-Capillary: 116 mg/dL — ABNORMAL HIGH (ref 65–99)
Glucose-Capillary: 125 mg/dL — ABNORMAL HIGH (ref 65–99)
Glucose-Capillary: 128 mg/dL — ABNORMAL HIGH (ref 65–99)
Glucose-Capillary: 130 mg/dL — ABNORMAL HIGH (ref 65–99)
Glucose-Capillary: 202 mg/dL — ABNORMAL HIGH (ref 65–99)
Glucose-Capillary: 70 mg/dL (ref 65–99)
Glucose-Capillary: 86 mg/dL (ref 65–99)

## 2014-05-31 LAB — APTT
aPTT: 94 seconds — ABNORMAL HIGH (ref 24–37)
aPTT: 108 seconds — ABNORMAL HIGH (ref 24–37)

## 2014-05-31 LAB — CARBOXYHEMOGLOBIN
Carboxyhemoglobin: 1.4 % (ref 0.5–1.5)
Methemoglobin: 1.5 % (ref 0.0–1.5)
O2 Saturation: 51.9 %
Total hemoglobin: 11.2 g/dL — ABNORMAL LOW (ref 13.5–18.0)

## 2014-05-31 LAB — POCT ACTIVATED CLOTTING TIME
ACTIVATED CLOTTING TIME: 153 s
Activated Clotting Time: 159 seconds

## 2014-05-31 LAB — LACTATE DEHYDROGENASE: LDH: 474 U/L — ABNORMAL HIGH (ref 98–192)

## 2014-05-31 LAB — LACTIC ACID, PLASMA
Lactic Acid, Venous: 1.5 mmol/L (ref 0.5–2.0)
Lactic Acid, Venous: 1.3 mmol/L (ref 0.5–2.0)

## 2014-05-31 SURGERY — REMOVAL, CARDIAC ASSIST DEVICE, IMPELLA
Anesthesia: General | Site: Groin | Laterality: Right

## 2014-05-31 MED ORDER — EPINEPHRINE HCL 1 MG/ML IJ SOLN
0.5000 ug/min | INTRAVENOUS | Status: AC
  Administered 2014-06-01: 5 ug/min via INTRAVENOUS
  Filled 2014-05-31: qty 4

## 2014-05-31 MED ORDER — SODIUM BICARBONATE 8.4 % IV SOLN
50.0000 meq | Freq: Once | INTRAVENOUS | Status: AC
  Administered 2014-05-31: 50 meq via INTRAVENOUS
  Filled 2014-05-31: qty 50

## 2014-05-31 MED ORDER — SODIUM CHLORIDE 0.9 % IV SOLN
50.0000 mg | INTRAVENOUS | Status: DC | PRN
  Administered 2014-05-31: 2 mg/h via INTRAVENOUS

## 2014-05-31 MED ORDER — 0.9 % SODIUM CHLORIDE (POUR BTL) OPTIME
TOPICAL | Status: DC | PRN
  Administered 2014-05-31: 1000 mL

## 2014-05-31 MED ORDER — ROCURONIUM BROMIDE 100 MG/10ML IV SOLN
INTRAVENOUS | Status: DC | PRN
  Administered 2014-05-31 (×2): 50 mg via INTRAVENOUS

## 2014-05-31 MED ORDER — HEPARIN (PORCINE) IN NACL 100-0.45 UNIT/ML-% IJ SOLN
600.0000 [IU]/h | INTRAMUSCULAR | Status: DC
  Filled 2014-05-31 (×2): qty 250

## 2014-05-31 MED ORDER — FUROSEMIDE 10 MG/ML IJ SOLN
40.0000 mg | Freq: Once | INTRAMUSCULAR | Status: AC
  Administered 2014-05-31: 40 mg via INTRAVENOUS
  Filled 2014-05-31: qty 4

## 2014-05-31 MED ORDER — SODIUM BICARBONATE 8.4 % IV SOLN
50.0000 meq | Freq: Once | INTRAVENOUS | Status: AC
  Administered 2014-05-31: 50 meq via INTRAVENOUS

## 2014-05-31 MED ORDER — SODIUM BICARBONATE 8.4 % IV SOLN
INTRAVENOUS | Status: AC
  Filled 2014-05-31: qty 50

## 2014-05-31 MED ORDER — SODIUM BICARBONATE 8.4 % IV SOLN
INTRAVENOUS | Status: DC | PRN
  Administered 2014-05-31 (×2): 50 meq via INTRAVENOUS

## 2014-05-31 MED ORDER — LIDOCAINE HCL (CARDIAC) 20 MG/ML IV SOLN
INTRAVENOUS | Status: DC | PRN
  Administered 2014-05-31: 100 mg via INTRAVENOUS

## 2014-05-31 MED ORDER — VASOPRESSIN 20 UNIT/ML IV SOLN
100.0000 [IU] | INTRAVENOUS | Status: DC | PRN
  Administered 2014-05-31: .03 [IU]/min via INTRAVENOUS

## 2014-05-31 MED ORDER — FENTANYL CITRATE (PF) 250 MCG/5ML IJ SOLN
INTRAMUSCULAR | Status: AC
  Filled 2014-05-31: qty 5

## 2014-05-31 MED ORDER — LACTATED RINGERS IV SOLN
INTRAVENOUS | Status: DC | PRN
  Administered 2014-05-31: 09:00:00 via INTRAVENOUS

## 2014-05-31 MED ORDER — DEXTROSE 5 % IV SOLN
4000.0000 ug | INTRAVENOUS | Status: DC | PRN
  Administered 2014-05-31: 40 ug/min via INTRAVENOUS

## 2014-05-31 MED ORDER — ALBUMIN HUMAN 5 % IV SOLN
INTRAVENOUS | Status: DC | PRN
  Administered 2014-05-31: 10:00:00 via INTRAVENOUS

## 2014-05-31 MED ORDER — AMIODARONE LOAD VIA INFUSION
INTRAVENOUS | Status: DC | PRN
  Administered 2014-05-31: 150 mg via INTRAVENOUS

## 2014-05-31 MED ORDER — LACTATED RINGERS IV SOLN
INTRAVENOUS | Status: DC | PRN
  Administered 2014-05-31: 08:00:00 via INTRAVENOUS

## 2014-05-31 MED ORDER — EPINEPHRINE HCL 1 MG/ML IJ SOLN
4000.0000 ug | INTRAVENOUS | Status: DC | PRN
  Administered 2014-05-31: 4 ug/min via INTRAVENOUS

## 2014-05-31 MED ORDER — DEXTROSE-NACL 5-0.9 % IV SOLN
INTRAVENOUS | Status: DC
  Administered 2014-05-31: 07:00:00 via INTRAVENOUS

## 2014-05-31 MED ORDER — CALCIUM CHLORIDE 10 % IV SOLN
1.0000 g | Freq: Once | INTRAVENOUS | Status: DC
  Filled 2014-05-31: qty 10

## 2014-05-31 MED ORDER — SODIUM CHLORIDE 0.9 % IV SOLN
200.0000 ug | INTRAVENOUS | Status: DC | PRN
Start: 2014-05-31 — End: 2014-05-31
  Administered 2014-05-31: .8 ug/kg/h via INTRAVENOUS

## 2014-05-31 MED ORDER — ALBUMIN HUMAN 5 % IV SOLN
12.5000 g | Freq: Once | INTRAVENOUS | Status: AC
  Administered 2014-05-31: 12.5 g via INTRAVENOUS

## 2014-05-31 MED ORDER — AMIODARONE LOAD VIA INFUSION
150.0000 mg | Freq: Once | INTRAVENOUS | Status: AC
  Administered 2014-05-31: 150 mg via INTRAVENOUS
  Filled 2014-05-31: qty 83.34

## 2014-05-31 MED ORDER — VANCOMYCIN HCL 1000 MG IV SOLR
INTRAVENOUS | Status: AC
  Filled 2014-05-31: qty 1000

## 2014-05-31 MED ORDER — FENTANYL CITRATE (PF) 100 MCG/2ML IJ SOLN
INTRAMUSCULAR | Status: DC | PRN
  Administered 2014-05-31 (×2): 50 ug via INTRAVENOUS

## 2014-05-31 MED ORDER — DOBUTAMINE IN D5W 4-5 MG/ML-% IV SOLN
INTRAVENOUS | Status: DC | PRN
  Administered 2014-05-31: 2.5 ug/kg/min via INTRAVENOUS

## 2014-05-31 MED ORDER — INSULIN DETEMIR 100 UNIT/ML ~~LOC~~ SOLN
14.0000 [IU] | Freq: Every day | SUBCUTANEOUS | Status: DC
  Administered 2014-06-01: 14 [IU] via SUBCUTANEOUS
  Filled 2014-05-31: qty 0.14

## 2014-05-31 MED ORDER — FUROSEMIDE 10 MG/ML IJ SOLN
40.0000 mg | Freq: Three times a day (TID) | INTRAMUSCULAR | Status: DC
  Administered 2014-05-31 – 2014-06-01 (×3): 40 mg via INTRAVENOUS
  Filled 2014-05-31 (×5): qty 4

## 2014-05-31 MED ORDER — VECURONIUM BROMIDE 10 MG IV SOLR
INTRAVENOUS | Status: DC | PRN
  Administered 2014-05-31: 5 mg via INTRAVENOUS

## 2014-05-31 MED ORDER — EPINEPHRINE HCL 1 MG/ML IJ SOLN
0.5000 ug/min | INTRAVENOUS | Status: AC
  Administered 2014-05-31: 5 ug/min via INTRAVENOUS
  Filled 2014-05-31 (×2): qty 4

## 2014-05-31 MED ORDER — CALCIUM CHLORIDE 10 % IV SOLN
1.0000 g | Freq: Once | INTRAVENOUS | Status: AC
  Administered 2014-05-31: 1 g via INTRAVENOUS

## 2014-05-31 MED ORDER — SODIUM BICARBONATE 8.4 % IV SOLN
100.0000 meq | Freq: Once | INTRAVENOUS | Status: AC
  Administered 2014-05-31: 100 meq via INTRAVENOUS

## 2014-05-31 MED ORDER — VANCOMYCIN HCL 1000 MG IV SOLR
INTRAVENOUS | Status: DC | PRN
  Administered 2014-05-31: 1000 mL

## 2014-05-31 SURGICAL SUPPLY — 100 items
ADAPTER CARDIO PERF ANTE/RETRO (ADAPTER) IMPLANT
ATTRACTOMAT 16X20 MAGNETIC DRP (DRAPES) IMPLANT
BAG DECANTER FOR FLEXI CONT (MISCELLANEOUS) IMPLANT
BANDAGE ELASTIC 4 VELCRO ST LF (GAUZE/BANDAGES/DRESSINGS) IMPLANT
BANDAGE ELASTIC 6 VELCRO ST LF (GAUZE/BANDAGES/DRESSINGS) IMPLANT
BASKET HEART  (ORDER IN 25'S) (MISCELLANEOUS)
BASKET HEART (ORDER IN 25'S) (MISCELLANEOUS)
BASKET HEART (ORDER IN 25S) (MISCELLANEOUS) IMPLANT
BLADE STERNUM SYSTEM 6 (BLADE) IMPLANT
BLADE SURG 12 STRL SS (BLADE) IMPLANT
BLADE SURG ROTATE 9660 (MISCELLANEOUS) IMPLANT
BNDG GAUZE ELAST 4 BULKY (GAUZE/BANDAGES/DRESSINGS) IMPLANT
CANISTER SUCTION 2500CC (MISCELLANEOUS) ×5 IMPLANT
CANNULA GUNDRY RCSP 15FR (MISCELLANEOUS) IMPLANT
CATH CPB KIT VANTRIGT (MISCELLANEOUS) IMPLANT
CATH ROBINSON RED A/P 18FR (CATHETERS) IMPLANT
CATH THORACIC 36FR RT ANG (CATHETERS) IMPLANT
COVER SURGICAL LIGHT HANDLE (MISCELLANEOUS) IMPLANT
CRADLE DONUT ADULT HEAD (MISCELLANEOUS) IMPLANT
DRAIN CHANNEL 32F RND 10.7 FF (WOUND CARE) IMPLANT
DRAPE CARDIOVASCULAR INCISE (DRAPES) ×2
DRAPE SLUSH/WARMER DISC (DRAPES) IMPLANT
DRAPE SRG 135X102X78XABS (DRAPES) ×3 IMPLANT
DRSG AQUACEL AG ADV 3.5X14 (GAUZE/BANDAGES/DRESSINGS) IMPLANT
DRSG COVADERM 4X14 (GAUZE/BANDAGES/DRESSINGS) ×5 IMPLANT
ELECT BLADE 4.0 EZ CLEAN MEGAD (MISCELLANEOUS)
ELECT BLADE 6.5 EXT (BLADE) IMPLANT
ELECT CAUTERY BLADE 6.4 (BLADE) IMPLANT
ELECT REM PT RETURN 9FT ADLT (ELECTROSURGICAL) ×10
ELECTRODE BLDE 4.0 EZ CLN MEGD (MISCELLANEOUS) IMPLANT
ELECTRODE REM PT RTRN 9FT ADLT (ELECTROSURGICAL) ×6 IMPLANT
GAUZE SPONGE 4X4 12PLY STRL (GAUZE/BANDAGES/DRESSINGS) ×5 IMPLANT
GLOVE BIO SURGEON STRL SZ 6 (GLOVE) ×5 IMPLANT
GLOVE BIO SURGEON STRL SZ 6.5 (GLOVE) ×4 IMPLANT
GLOVE BIO SURGEON STRL SZ7.5 (GLOVE) ×10 IMPLANT
GLOVE BIO SURGEONS STRL SZ 6.5 (GLOVE) ×1
GOWN STRL REUS W/ TWL LRG LVL3 (GOWN DISPOSABLE) ×18 IMPLANT
GOWN STRL REUS W/TWL LRG LVL3 (GOWN DISPOSABLE) ×12
HEMOSTAT POWDER SURGIFOAM 1G (HEMOSTASIS) IMPLANT
HEMOSTAT SURGICEL 2X14 (HEMOSTASIS) IMPLANT
INSERT FOGARTY XLG (MISCELLANEOUS) IMPLANT
KIT BASIN OR (CUSTOM PROCEDURE TRAY) IMPLANT
KIT REMOVER STAPLE SKIN (MISCELLANEOUS) ×5 IMPLANT
KIT ROOM TURNOVER OR (KITS) ×5 IMPLANT
KIT SUCTION CATH 14FR (SUCTIONS) IMPLANT
KIT VASOVIEW W/TROCAR VH 2000 (KITS) IMPLANT
LEAD PACING MYOCARDI (MISCELLANEOUS) IMPLANT
MARKER GRAFT CORONARY BYPASS (MISCELLANEOUS) IMPLANT
NS IRRIG 1000ML POUR BTL (IV SOLUTION) ×15 IMPLANT
PACK OPEN HEART (CUSTOM PROCEDURE TRAY) ×5 IMPLANT
PAD ARMBOARD 7.5X6 YLW CONV (MISCELLANEOUS) ×10 IMPLANT
PAD ELECT DEFIB RADIOL ZOLL (MISCELLANEOUS) ×5 IMPLANT
PENCIL BUTTON HOLSTER BLD 10FT (ELECTRODE) IMPLANT
PUNCH AORTIC ROTATE 4.0MM (MISCELLANEOUS) IMPLANT
PUNCH AORTIC ROTATE 4.5MM 8IN (MISCELLANEOUS) IMPLANT
PUNCH AORTIC ROTATE 5MM 8IN (MISCELLANEOUS) IMPLANT
SPONGE GAUZE 4X4 12PLY STER LF (GAUZE/BANDAGES/DRESSINGS) ×5 IMPLANT
STOPCOCK 4 WAY LG BORE MALE ST (IV SETS) ×5 IMPLANT
SURGIFLO W/THROMBIN 8M KIT (HEMOSTASIS) IMPLANT
SUT BONE WAX W31G (SUTURE) IMPLANT
SUT ETHIBOND 2 0 SH (SUTURE) ×2
SUT ETHIBOND 2 0 SH 36X2 (SUTURE) ×3 IMPLANT
SUT MNCRL AB 4-0 PS2 18 (SUTURE) IMPLANT
SUT PROLENE 3 0 SH DA (SUTURE) IMPLANT
SUT PROLENE 3 0 SH1 36 (SUTURE) IMPLANT
SUT PROLENE 4 0 RB 1 (SUTURE) ×2
SUT PROLENE 4 0 SH DA (SUTURE) ×5 IMPLANT
SUT PROLENE 4-0 RB1 .5 CRCL 36 (SUTURE) ×3 IMPLANT
SUT PROLENE 5 0 C 1 36 (SUTURE) ×5 IMPLANT
SUT PROLENE 6 0 C 1 30 (SUTURE) IMPLANT
SUT PROLENE 6 0 CC (SUTURE) IMPLANT
SUT PROLENE 8 0 BV175 6 (SUTURE) IMPLANT
SUT PROLENE BLUE 7 0 (SUTURE) IMPLANT
SUT SILK  1 MH (SUTURE)
SUT SILK 1 MH (SUTURE) IMPLANT
SUT SILK 1 TIES 10X30 (SUTURE) ×5 IMPLANT
SUT SILK 2 0 SH CR/8 (SUTURE) IMPLANT
SUT SILK 2 0 TIES 10X30 (SUTURE) IMPLANT
SUT SILK 2 0SH CR/8 30 (SUTURE) ×5 IMPLANT
SUT SILK 3 0 SH CR/8 (SUTURE) IMPLANT
SUT STEEL 6MS V (SUTURE) IMPLANT
SUT STEEL SZ 6 DBL 3X14 BALL (SUTURE) IMPLANT
SUT VIC AB 1 CTX 36 (SUTURE)
SUT VIC AB 1 CTX36XBRD ANBCTR (SUTURE) IMPLANT
SUT VIC AB 2-0 CT1 18 (SUTURE) ×10 IMPLANT
SUT VIC AB 2-0 CT1 27 (SUTURE)
SUT VIC AB 2-0 CT1 TAPERPNT 27 (SUTURE) IMPLANT
SUT VIC AB 2-0 CTX 27 (SUTURE) IMPLANT
SUT VIC AB 3-0 X1 27 (SUTURE) IMPLANT
SUTURE E-PAK OPEN HEART (SUTURE) IMPLANT
SYSTEM SAHARA CHEST DRAIN ATS (WOUND CARE) IMPLANT
TAPE CLOTH SURG 4X10 WHT LF (GAUZE/BANDAGES/DRESSINGS) ×5 IMPLANT
TOWEL OR 17X24 6PK STRL BLUE (TOWEL DISPOSABLE) ×10 IMPLANT
TOWEL OR 17X26 10 PK STRL BLUE (TOWEL DISPOSABLE) ×10 IMPLANT
TRAY CATH LUMEN 1 20CM STRL (SET/KITS/TRAYS/PACK) ×5 IMPLANT
TRAY FOLEY IC TEMP SENS 16FR (CATHETERS) IMPLANT
TUBING ART PRESS 48 MALE/FEM (TUBING) ×10 IMPLANT
TUBING INSUFFLATION (TUBING) IMPLANT
UNDERPAD 30X30 INCONTINENT (UNDERPADS AND DIAPERS) ×5 IMPLANT
WATER STERILE IRR 1000ML POUR (IV SOLUTION) ×10 IMPLANT

## 2014-05-31 NOTE — Anesthesia Preprocedure Evaluation (Addendum)
Anesthesia Evaluation  Patient identified by MRN, date of birth, ID band Patient awake  General Assessment Comment:Intubated from ICU.   Airway Mallampati: Intubated      Comment: Intubated from ICU Dental   Pulmonary former smoker,    + decreased breath sounds      Cardiovascular hypertension, + Past MI and +CHF Rhythm:Regular Rate:Normal     Neuro/Psych    GI/Hepatic   Endo/Other  diabetes  Renal/GU Renal disease     Musculoskeletal   Abdominal   Peds  Hematology   Anesthesia Other Findings   Reproductive/Obstetrics                            Anesthesia Physical Anesthesia Plan  ASA: IV  Anesthesia Plan: General   Post-op Pain Management:    Induction: Intravenous  Airway Management Planned: Oral ETT  Additional Equipment: PA Cath, TEE, Ultrasound Guidance Line Placement and Arterial line  Intra-op Plan:   Post-operative Plan: Post-operative intubation/ventilation  Informed Consent:   Plan Discussed with: CRNA, Anesthesiologist and Surgeon  Anesthesia Plan Comments:         Anesthesia Quick Evaluation

## 2014-05-31 NOTE — Transfer of Care (Signed)
Immediate Anesthesia Transfer of Care Note  Patient: ANSHUL MEDDINGS  Procedure(s) Performed: Procedure(s): ATTEMPTED REMOVAL OF IMPELLA LEFT VENTRICULAR ASSIST DEVICE (N/A) TRANSESOPHAGEAL ECHOCARDIOGRAM (TEE) (N/A) ARTERIAL LINE INSERTION (Right) IRRIGATION AND DEBRIDEMENT WOUND (N/A) EVACUATION HEMATOMA (N/A)  Patient Location: SICU  Anesthesia Type:General  Level of Consciousness: Patient remains intubated per anesthesia plan  Airway & Oxygen Therapy: Patient remains intubated per anesthesia plan  Post-op Assessment: Report given to RN and Post -op Vital signs reviewed and stable  Post vital signs: Reviewed and stable  Last Vitals:  Filed Vitals:   05/24/2014 0700  BP: 107/89  Pulse: 26  Temp:   Resp: 27    Complications: No apparent anesthesia complications

## 2014-05-31 NOTE — Progress Notes (Addendum)
Spoke with Dr. Roxan Hockey regarding need for increased sedation. Jeffrey [name] coughing and choking on ventilator. Versed drip currently at 2mg /hour. Order received to increase sedation. Also updated that hemoglobin is 9.9. And that chest tube output has been minimal since dump at 2000. Will restart Heparin drip at midnight. Made aware that defibrillation was done at 2055. Will continue to closely monitor. Jeffrey Blade RN

## 2014-05-31 NOTE — Progress Notes (Signed)
ABG results called to MD Hendrickson, RR decreased from 18 to 16, RT aware. CT output increasing, 60-80 last two hours. This current hour, chest tube output 100. MD Roxan Hockey wants to be notified when chest tube output reaches 200/hr. Will cont to monitor.  Jeffrey [name] L

## 2014-05-31 NOTE — Progress Notes (Signed)
Pt in VT, shock x 2. MD Prescott Gum aware, 1 amp bicarb given, 150 amio bolus administered per previous order. Will cont to monitor.  Jeffrey [name]

## 2014-05-31 NOTE — Progress Notes (Signed)
Day of Surgery Procedure(s) (LRB): ATTEMPTED REMOVAL OF IMPELLA LEFT VENTRICULAR ASSIST DEVICE (N/A) TRANSESOPHAGEAL ECHOCARDIOGRAM (TEE) (N/A) ARTERIAL LINE INSERTION (Right) IRRIGATION AND DEBRIDEMENT WOUND (N/A) EVACUATION HEMATOMA (N/A) Subjective: Postop day 8 multivessel CABG for preoperative MI, ejection fraction 15%, preoperative balloon pump placed for cardiogenic shock  Patient was weaned on percutaneous LVAD support overnight and maintained adequate hemodynamics.  Patient was brought back to the operating room for further weaning of the percutaneous LVAD under transesophageal echocardiogram imaging. Although LV function appears to have improved since CABG, the patient's hemodynamics were still dependent on significant doses of norepinephrine and vasopressin so the percutaneous LVAD was not removed. The patient required 1 DC cardioversion in the operating room for a prolonged run of V. tach. The LVAD incision was opened irrigated and evacuation of hematoma was performed. Incision was then closed.  The plan will be to treat the patient's sepsis from presumed C. difficile colitis and attempt to wean and remove the percutaneous LVAD in 72 hours-Monday, May 23.  Objective: Vital signs in last 24 hours: Temp:  [97.4 F (36.3 C)-99.1 F (37.3 C)] 97.5 F (36.4 C) (05/20 0000) Pulse Rate:  [25-93] 26 (05/20 0700) Cardiac Rhythm:  [-] Atrial fibrillation (05/20 0000) Resp:  [14-32] 27 (05/20 0700) BP: (58-129)/(28-89) 107/89 mmHg (05/20 0700) SpO2:  [87 %-100 %] 100 % (05/20 0700) FiO2 (%):  [40 %] 40 % (05/20 0800) Weight:  [238 lb 8.6 oz (108.2 kg)] 238 lb 8.6 oz (108.2 kg) (05/20 0400)  Hemodynamic parameters for last 24 hours: CVP:  [8 mmHg-12 mmHg] 12 mmHg  Intake/Output from previous day: 05/19 0701 - 05/20 0700 In: 5827.2 [I.V.:3102.7; Blood:670; NG/GT:725; IV Piggyback:900] Out: 2426 [Urine:795; Emesis/NG output:200; Stool:650; Chest Tube:100] Intake/Output this  shift: Total I/O In: 1200 [I.V.:1200] Out: -   Exam Patient is edematous, sedated on ventilator Surgical incisions clean Hyperactive bowel sounds-abdomen soft Extremities warm  Lab Results:  Recent Labs  06/08/2014 1245 05/20/2014 1350 05/12/2014 0400  WBC 49.2*  --  65.8*  HGB 10.2* 10.5* 10.9*  HCT 29.7* 31.0* 30.5*  PLT 135*  --  140*   BMET:  Recent Labs  05/18/2014 0445 05/20/2014 1350 05/17/2014 0400  NA 126* 126* 124*  K 3.7 3.9 3.7  CL 97* 95* 98*  CO2 19*  --  18*  GLUCOSE 95 203* 60*  BUN 32* 39* 47*  CREATININE 1.63* 1.90* 1.93*  CALCIUM 6.7*  --  6.8*    PT/INR: No results for input(s): LABPROT, INR in the last 72 hours. ABG    Component Value Date/Time   PHART 7.395 05/24/2014 0436   HCO3 15.7* 06/05/2014 0436   TCO2 17 06/04/2014 0436   ACIDBASEDEF 8.0* 05/15/2014 0436   O2SAT 51.9 06/08/2014 0506   CBG (last 3)   Recent Labs  05/24/2014 0803 05/13/2014 1225 05/14/2014 0437  GLUCAP 153* 194* 70    Assessment/Plan: S/P Procedure(s) (LRB): ATTEMPTED REMOVAL OF IMPELLA LEFT VENTRICULAR ASSIST DEVICE (N/A) TRANSESOPHAGEAL ECHOCARDIOGRAM (TEE) (N/A) ARTERIAL LINE INSERTION (Right) IRRIGATION AND DEBRIDEMENT WOUND (N/A) EVACUATION HEMATOMA (N/A) Diuresis Diabetes control Continue ABX therapy due to Post-op infection--C. difficile colitis Continue percutaneous LVAD support at full perfusion-4.5 L/m  LOS: 17 days    Tharon Aquas Trigt III 05/31/2014

## 2014-05-31 NOTE — Anesthesia Procedure Notes (Signed)
Procedures Procedures: Right IJ Gordy Councilman Catheter Insertion: [id number]: The patient was identified and consent obtained.  TO was performed, and full barrier precautions were used.  The skin was anesthetized with lidocaine-4cc plain with 25g needle.  Once the vein was located with the 22 ga. needle using ultrasound guidance , the wire was inserted into the vein.  The wire location was confirmed with ultrasound.  The tissue was dilated and the 8.5 [place] cordis catheter was carefully inserted. Afterwards Gordy Councilman catheter was inserted. PA catheter at 45cm.  The patient tolerated the procedure well.   CE

## 2014-05-31 NOTE — Progress Notes (Signed)
CRITICAL VALUE ALERT  Critical value received:  WBC-65.8  Date of notification:  05/18/2014  Time of notification:  0840  Critical value read back:Yes.    Nurse who received alert:  Thelma Comp  MD notified (1st page):  Prescott Gum  Time of first page:  0840  MD notified (2nd page):  Time of second page:  Responding MD:  Prescott Gum  Time MD responded:  [phone]

## 2014-05-31 NOTE — Progress Notes (Signed)
      [address]       [place] [address]             [phone]      Intubated, sedated  BP 79/36 mmHg  Pulse 44  Temp(Src) 98.2 F (36.8 C) (Core (Comment))  Resp 13  Ht 5\' 9"  (1.753 m)  Wt 238 lb 8.6 oz (108.2 kg)  BMI 35.21 kg/m2  SpO2 98%   Intake/Output Summary (Last 24 hours) at 05/23/2014 1726 Last data filed at 05/27/2014 1600  Gross per 24 hour  Intake 10268.27 ml  Output   1465 ml  Net 8803.27 ml    Unable to wean impella this AM  VT earlier today - shocked 3 times  Remains critically ill. Continue present measures  Revonda Standard. Roxan Hockey, MD [facility] [phone]

## 2014-05-31 NOTE — Anesthesia Postprocedure Evaluation (Signed)
  Anesthesia Post-op Note  Patient: Jeffrey [name]  Procedure(s) Performed: Procedure(s): ATTEMPTED REMOVAL OF IMPELLA LEFT VENTRICULAR ASSIST DEVICE (N/A) TRANSESOPHAGEAL ECHOCARDIOGRAM (TEE) (N/A) ARTERIAL LINE INSERTION (Right) IRRIGATION AND DEBRIDEMENT WOUND (N/A) EVACUATION HEMATOMA (N/A)  Patient Location: PACU and SICU  Anesthesia Type:General  Level of Consciousness: sedated  Airway and Oxygen Therapy: Patient remains intubated per anesthesia plan  Post-op Pain: none  Post-op Assessment: Post-op Vital signs reviewed  Post-op Vital Signs: Reviewed  Last Vitals:  Filed Vitals:   05/21/2014 1830  BP:   Pulse: 69  Temp: 36.9 C  Resp: 0    Complications: No apparent anesthesia complications

## 2014-05-31 NOTE — Progress Notes (Signed)
Advanced Heart Failure Rounding Note   Subjective:    Underwent CABG and Impella 5.0 placement on 5/12.  Remains intubated and sedated. 40% FiO2.   Developed c.diff colitis 2 days ago associated with septic shock. Abx adjusted. Taken back to OR today for possible Impella removal but continued to be acidotic and require high dose pressor support despite some improvement in LV function on TEE. Thus Impella not removed. Impella site opened and cleaned. Had episode of VT and got DC-CV in OR and 3 shocks since he's been back  Co-ox 52% on P-5 this am. Now on P-8 with good waveform. maxed out on levophed and vasopressin. Also on epi 5 and dobut 2.5  Remains markedly volume overloaded. cvp 17  Objective:    Vital Signs:   Temp:  [97.5 F (36.4 C)-98.1 F (36.7 C)] 98.1 F (36.7 C) (05/20 1230) Pulse Rate:  [25-93] 34 (05/20 1230) Resp:  [0-32] 0 (05/20 1230) BP: (48-122)/(14-89) 79/58 mmHg (05/20 1200) SpO2:  [91 %-100 %] 96 % (05/20 1230) Arterial Line BP: (85-118)/(59-66) 94/65 mmHg (05/20 1230) FiO2 (%):  [40 %-50 %] 50 % (05/20 1157) Weight:  [108.2 kg (238 lb 8.6 oz)] 108.2 kg (238 lb 8.6 oz) (05/20 0400) Last BM Date: 05/29/14  Weight change: Filed Weights   05/29/14 0544 05/13/2014 0300 05/17/2014 0400  Weight: 104.781 kg (231 lb) 109.2 kg (240 lb 11.9 oz) 108.2 kg (238 lb 8.6 oz)    Intake/Output:   Intake/Output Summary (Last 24 hours) at 05/14/2014 1254 Last data filed at 05/21/2014 1200  Gross per 24 hour  Intake 9922.38 ml  Output   1245 ml  Net 8677.38 ml     Physical Exam: General: Intubated sedated HEENT: ETT Neck: supple. RIJ sheath Cor: R subclavian Impella sheath. Sternal dressing Iregular rhythm, controlled rate. impella hum Lungs: rhonch Abdomen: soft, nontender, + distended. No hepatosplenomegaly. No bruits or masses. hypoactive bowel sounds. Extremities: no cyanosis, clubbing, rash,  R and LLE SCDs. 4+ edema. Rectal tube Neuro: intubated  sedated  Telemetry: AF  Labs: Basic Metabolic Panel:  Recent Labs Lab 05/25/14 0500  05/26/14 0326  05/26/14 1330  05/27/14 0252  05/28/14 0408  05/29/14 0300  05/29/14 1545 05/14/2014 0445 05/14/2014 1350 05/28/2014 0400 05/26/2014 1057  NA  --   < >  --   < >  --   < > 129*  < > 132*  < > 128*  < > 128* 126* 126* 124* 127*  K  --   < >  --   < > 3.7  < > 3.6  < > 3.4*  < > 3.9  < > 3.7 3.7 3.9 3.7 3.7  CL  --   < >  --   < >  --   < > 96*  < > 104  < > 97*  < > 93* 97* 95* 98* 97*  CO2  --   < >  --   --   --   --  26  --  22  --  25  --   --  19*  --  18* 21*  GLUCOSE  --   < >  --   < >  --   < > 106*  < > 150*  < > 203*  < > 185* 95 203* 60* 111*  BUN  --   < >  --   < >  --   < > 15  < > 14  < >  18  < > 23* 32* 39* 47* 48*  CREATININE  --   < >  --   < >  --   < > 1.30*  < > 1.08  < > 1.33*  < > 1.40* 1.63* 1.90* 1.93* 1.84*  CALCIUM  --   < >  --   --   --   --  7.5*  --  6.5*  --  7.3*  --   --  6.7*  --  6.8* 6.8*  MG 1.8  --  1.7  --  2.2  --  1.7  --   --   --  1.6*  --   --   --   --   --   --   PHOS  --   --   --   --   --   --   --   --   --   --  2.7  --   --   --   --   --   --   < > = values in this interval not displayed.  Liver Function Tests:  Recent Labs Lab 05/27/14 0252 05/28/14 0408 05/29/14 0300 05/21/2014 0445 05/28/2014 0400  AST 21 24 26 26  33  ALT 23 19 23 19 21   ALKPHOS 83 85 107 84 96  BILITOT 1.1 0.7 0.8 1.1 1.1  PROT 4.4* 3.7* 4.3* 3.9* 3.7*  ALBUMIN 2.1* 1.7* 1.7* 1.8* 1.4*   CBC:  Recent Labs Lab 05/29/14 2140 06/07/2014 0445 05/29/2014 1245 05/24/2014 1350 06/04/2014 0400 06/07/2014 1057  WBC 36.0* 45.7* 49.2*  --  65.8* 68.1*  HGB 9.0* 7.4* 10.2* 10.5* 10.9* 9.6*  HCT 26.2* 21.4* 29.7* 31.0* 30.5* 27.6*  MCV 80.1 80.5 81.4  --  79.6 80.0  PLT 144* 158 135*  --  140* 132*    Cardiac Enzymes: No results for input(s): CKTOTAL, CKMB, CKMBINDEX, TROPONINI in the last 168 hours.  BNP: BNP (last 3 results)  Recent Labs  06/09/2014 1630   BNP 885.0*    ProBNP (last 3 results) No results for input(s): PROBNP in the last 8760 hours.    Other results:  Imaging: Dg Abd 1 View  05/17/2014   CLINICAL DATA:  Distended abdomen  EXAM: ABDOMEN - 1 VIEW  COMPARISON:  05/27/2014  FINDINGS: The feeding catheter is again identified in the fourth portion of the duodenum. Nasogastric catheter extends just beyond the gastroesophageal junction scattered large and small bowel gas is noted. A single gas-filled loop of bowel is noted in the mid abdomen likely representing the sigmoid colon. No definitive free air is seen.  IMPRESSION: Tubes and lines as described.  Single gas distended loop of sigmoid colon. No definitive obstruction is noted.   Electronically Signed   By: Inez Catalina M.D.   On: 05/20/2014 07:09   Dg Chest Port 1 View  06/02/2014   CLINICAL DATA:  Impella left ventricular assist device present.  EXAM: PORTABLE CHEST - 1 VIEW 11:05 a.m.  COMPARISON:  06/11/2014 at 7:03 a.m.  FINDINGS: Endotracheal tube, NG tube, new Swan-Ganz catheter, bilateral central venous catheters, and Impella left ventricular assist device are in place. Swan-Ganz catheter tip is in the main pulmonary artery. Mediastinal drainage catheter is unchanged at the inferior aspect of the heart.  Right lung is clear. Slight left perihilar haziness is felt to be due to a shallow inspiration.  IMPRESSION: Interval insertion of Swan-Ganz catheter.  No other change.   Electronically Signed  By: Lorriane Shire M.D.   On: 05/12/2014 11:20   Dg Chest Port 1 View  05/23/2014   CLINICAL DATA:  Check chest tube position  EXAM: PORTABLE CHEST - 1 VIEW  COMPARISON:  05/12/2014  FINDINGS: Cardiac shadow is stable. Left subclavian central venous catheter is again noted and stable. Endotracheal tube is seen 3.7 cm above the carina. A nasogastric catheter and feeding catheter are again noted. Mediastinal drain and left thoracostomy catheter are again noted. An Impella ventricular  assist device is noted. No pneumothorax is seen. Increasing right basilar atelectasis and effusion is noted. Mild vascular congestion remains.  IMPRESSION: Tubes and lines as described.  Mild vascular congestion.  Increasing right basilar atelectasis and effusion.   Electronically Signed   By: Inez Catalina M.D.   On: 06/07/2014 07:11   Dg Chest Port 1 View  05/26/2014   CLINICAL DATA:  Encounter for central line placement. Status post CABG and Impella placement.  EXAM: PORTABLE CHEST - 1 VIEW  COMPARISON:  06/04/2014  FINDINGS: Again noted is an Impella device in the region of the left ventricle from a right subclavian approach. Again noted are chest tubes without a large pneumothorax. There is a new left subclavian central line with the tip in the lower SVC region. PICC line tip in the SVC region. There is a right jugular central line with the tip in the upper SVC region. Nasogastric tube and feeding tube extend to the abdomen. Difficult to identify the endotracheal tip due to multiple overlying devices. Slightly prominent interstitial lung densities bilaterally. Basilar densities could represent atelectasis. Heart size is grossly stable with median sternotomy wires.  IMPRESSION: Placement of a left subclavian central line with the tip in the lower SVC region. Negative for pneumothorax.  Multiple support apparatuses as described.  Slightly prominent interstitial lung markings could represent atelectasis or mild edema.   Electronically Signed   By: Markus Daft M.D.   On: 06/09/2014 14:20   Dg Chest Port 1 View  05/26/2014   CLINICAL DATA:  Status post coronary bypass grafting  EXAM: PORTABLE CHEST - 1 VIEW  COMPARISON:  05/29/2014  FINDINGS: Cardiac shadow is mildly enlarged. Right jugular central venous sheath is again noted and stable. The Swan-Ganz catheter has been removed. A right-sided PICC line is noted in the mid superior vena cava and stable. The endotracheal tube is stable in appearance 3.2 cm above  the carina. A nasogastric catheter is noted within the stomach. An Impella assist device is noted in satisfactory position. The lungs are well aerated bilaterally. Mild vascular congestion is seen although improved aeration is noted in the bases bilaterally.  IMPRESSION: Persistent vascular congestion with improved overall aeration.  Tubes and lines as described stable in appearance.  Interval removal of Swan-Ganz catheter.   Electronically Signed   By: Inez Catalina M.D.   On: 06/11/2014 07:37     Medications:     Scheduled Medications: . sodium chloride   Intravenous Once  . sodium chloride   Intravenous Once  . antiseptic oral rinse  7 mL Mouth Rinse QID  . aspirin EC  325 mg Oral Daily   Or  . aspirin  324 mg Per Tube Daily  . bisacodyl  10 mg Oral Daily   Or  . bisacodyl  10 mg Rectal Daily  . chlorhexidine  15 mL Mouth Rinse BID  . docusate sodium  200 mg Oral Daily  . epinephrine  0.5-20 mcg/min Intravenous To OR  .  feeding supplement (PRO-STAT SUGAR FREE 64)  30 mL Per Tube 5 X Daily  . insulin aspart  0-24 Units Subcutaneous 6 times per day  . [START ON 06/05/14] insulin detemir  14 Units Subcutaneous Daily  . metroNIDAZOLE  500 mg Oral TID  . pantoprazole (PROTONIX) IV  40 mg Intravenous Q12H  . piperacillin-tazobactam (ZOSYN)  IV  3.375 g Intravenous 3 times per day  . sodium chloride  3 mL Intravenous Q12H  . vancomycin 1000 mg in NS (1000 ml) irrigation for Dr. Roxy Manns case   Irrigation To OR  . vancomycin  1,250 mg Intravenous Q24H  . vancomycin  500 mg Oral 4 times per day    Infusions: . sodium chloride Stopped (05/27/14 0120)  . sodium chloride    . sodium chloride Stopped (06/04/2014 0640)  . amiodarone 150 mg/hr (05/26/2014 1135)  . dexmedetomidine 0.799 mcg/kg/hr (05/30/2014 0600)  . dextrose 5 % and 0.9% NaCl 50 mL/hr at 05/26/2014 0640  . DOBUTamine 2.213 mcg/kg/min (05/14/2014 0600)  . impella catheter heparin 50 unit/mL in dextrose 5%    . heparin    . lactated  ringers 10 mL/hr at 05/29/2014 0600  . lidocaine 1 mg/min (05/29/2014 0800)  . midazolam (VERSED) infusion 2 mg/hr (05/19/2014 0657)  . norepinephrine (LEVOPHED) Adult infusion 40 mcg/min (05/30/2014 0658)  . vasopressin (PITRESSIN) infusion - *FOR SHOCK* 0.03 Units/min (05/25/2014 0600)    PRN Medications: sodium chloride, levalbuterol, morphine injection, ondansetron (ZOFRAN) IV, potassium chloride, sodium chloride, traMADol   Assessment:   1. Cardiogenic shock 2. Ventricular fibrillation arrest 5/6 in setting of milrinone use 3. 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 3v CAD/NSTEMI: Occluded RCA, subtotally occluded prox LAD, 90% LCx 5. DM2 6. Atrial fibrillation, newly diagnosed on admission -> NSR due to defib 5/10 7. Microcytic anemia/thrombocytopenia  8. Acute kidney injury, 9. 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 Thrombocytopenia 11. Acute delirium 12. Hyponatremia 13. Recurrent VT 14. Septic shock 5/19 15. C.diff colitis  16. Severe protein calorie malnutrition   Plan/Discussion:    S/p  CABG with Impella 5.0 support 06/06/2014.   He has profound septic and cardiogenic shock complicated by recurrent VT/VF requiring DC-CV. He is requiring high-dose pressor support as well as broad spectrum abx.   I had long talk with his family and told them that I think chance of meaningful recovery is very slim. They reiterated that he would not want this level of care.   We have decided to push forward with 48-72 hours more of aggressive care and reassess. They will talk more among themselves toda and let us know if anything changes.   Will continue current regimen. Increase lasix to 40 tid.   The patient is critically ill with multiple organ systems failure and requires high complexity decision making for assessment and support, frequent evaluation and titration of therapies, application  of advanced monitoring technologies and extensive interpretation of multiple databases.   Critical Care Time devoted to patient care services described in this note is 45 Minutes.    Glori Bickers MD 05/20/2014 12:54 PM

## 2014-05-31 NOTE — Progress Notes (Signed)
Pharmacy Consult Note  Pharmacy Consult for heparin Indication: Impella 5.0  Pharmacy Consult for Vancomycin, Zosyn, Flagyl Indication: sepsis  Allergies  Allergen Reactions  . Ambien [Zolpidem Tartrate] Other (See Comments)    Agitated delirium secondary to Ambien administration on 05/21/14  . Penicillins Rash    Patient Measurements: Height: 5\' 9"  (175.3 cm) Weight: 238 lb 8.6 oz (108.2 kg) IBW/kg (Calculated) : 70.7 Heparin Dosing Weight: 88kg  Vital Signs: Temp: 97.5 F (36.4 C) (05/20 0000) Temp Source: Axillary (05/20 0000) BP: 107/89 mmHg (05/20 0700) Pulse Rate: 26 (05/20 0700)  Labs:  Recent Labs  05/12/2014 0445 06/03/2014 1245 05/29/2014 1350 05/21/2014 0015 05/13/2014 0400  HGB 7.4* 10.2* 10.5*  --  10.9*  HCT 21.4* 29.7* 31.0*  --  30.5*  PLT 158 135*  --   --  140*  APTT >200* 78*  --  94*  --   CREATININE 1.63*  --  1.90*  --  1.93*    Estimated Creatinine Clearance: 42.6 mL/min (by C-G formula based on Cr of 1.93).   Medical History: Past Medical History  Diagnosis Date  . Type 2 diabetes mellitus   . Hypothyroidism   . Hyperlipidemia   . Essential hypertension   . Insomnia   . Depression   . Atrial fibrillation     Diagnosed May 2016    Assessment: 74 yo male s/p CABG on 5/12 and on Impella 5.0.  Patient unable to tolerate removal of Impella in OR today 5/20, will remain in over weekend with new plans to pull on Monday.  Anticoagulation: Purge volume currently at 12.8 ml/hr (640 units/hr), systemic heparin was off post OR and orders to resume at 600 units/hr by Dr.Van Trigt @11am  = total 1240 units/hr. Discussed heparin with nurse, she stated no titration in heparin - leave systemic at 600 units/hr. Pharmacy will continue to follow peripherally but will not adjust. Will order HL as per hospital policy but do not expect it to be be detectable.  -hourly ACT trend:165-171 on 5/19 - now in 150s today but systemic heparin off post-OR visit Hgb  dropped to 7.4 this am - will transfuse blood, pltc has responded to 158 this am.  Infectious Disease/Sepsis - empiric cefepime prior to CABG, started fortaz 5/16, sepsis worsened 5/18and abx broadened with primaxin/fluconazole. Now + for cdiff and flagyl and po vanc added. Tmax 101.3, wbc 45>65>68. Scr down somewhat to 1.8.   Will check vancomycin level over weekend  Limit abx exposure as able to help with Cdiff  5/9 Cefepime >> 5/12 5/16 fortaz>>5/19 5/18 vanc>> 5/19 primaxin>>5/19 5/19 zosyn>> 5/19 fluconazole> 5/19 5/19 flagyl>> 5/19 po vanc>>  5/12 resp- ngF 5/11 cdiff negative 5/19 cdiff - + 5/18 bld x 2 -ngtd 5/18 resp cx- ngF   Goal of Therapy:  ACT= 160-180 Vancomycin trough 15-20 Monitor platelets by anticoagulation protocol: Yes   Plan:  -systemic heparin currently at 600 units/hr -dosing by CVTS - no adjustment per pharmacy -Vanc at 1250mg  IV q24 hours - check trough over weekend -Zosyn 3.375g IV q8 hours - Continue po vanc x 14d - Limit broad abx exposure as able  Erin Hearing PharmD., BCPS Clinical Pharmacist Pager [phone] 05/30/2014 10:37 AM

## 2014-05-31 NOTE — Progress Notes (Signed)
Pt rhythm sustained VT. Pt continuously being monitored by zoll, quick response shock x 1, pt converted to Afib. MD Prescott Gum notified, no further orders at that time. Will cont to monitor. Shannie Kontos L

## 2014-05-31 NOTE — Progress Notes (Signed)
RT note-Patient back from OR, and placed on ventilator with current settings.

## 2014-05-31 NOTE — Progress Notes (Signed)
The patient was examined and preop studies reviewed. There has been no change from the prior exam and the patient is ready for surgery.   Plan removal of percutaneous LVAD on J Sedgwick today

## 2014-05-31 NOTE — Progress Notes (Signed)
Dr. Roxan Hockey notified of chest tube output of 350cc dark red blood over last hour. Vital signs stable on current drips. Order received to do stat CBC and to turn off heparin drip. Will continue to closely monitor. Richarda Blade RN

## 2014-05-31 NOTE — Progress Notes (Signed)
*  PRELIMINARY RESULTS* Echocardiogram TEE has been performed.  Leavy Cella 05/22/2014, 10:12 AM

## 2014-05-31 NOTE — Op Note (Signed)
NAME:  Jeffrey [name], Jeffrey [name] NO.:  [id number]  MEDICAL RECORD NO.:  [id number]  LOCATION:  2S07C                        FACILITY:  [facility]  PHYSICIAN:  Ivin Poot, M.D.  DATE OF BIRTH:  07/25/1940  DATE OF PROCEDURE:  06/08/2014 DATE OF DISCHARGE:                              OPERATIVE REPORT   OPERATION:  Placement of triple-lumen central catheter for IV access - left subclavian vein.  SURGEON:  Ivin Poot, M.D.  ANESTHESIA:  Local 1% lidocaine.  OPERATIVE PROCEDURE:  The patient's left chest and shoulder were prepped and draped as a sterile field after informed consent was obtained by a family member.  A sterile field was created by sterile drapes and towels.  Lidocaine 1% was infiltrated beneath the left scapula.  A proper time-out had been performed.  Using the Seldinger technique, the left subclavian vein was cannulated and a guidewire was passed and the needle was withdrawn.  Over the wire, a dilator was passed.  Over the wire, a triple-lumen catheter was passed easily to the right side of the heart.  The wire was withdrawn and the catheter was flushed easily and filled with saline in all 3 ports.  The catheter was secured to the skin with sutures and a Biopatch was placed around the entry site and a sterile dressing was applied.  A chest x-ray is pending.  There were no known complications.     Ivin Poot, M.D.     PV/MEDQ  D:  05/28/2014  T:  05/17/2014  Job:  [id number]

## 2014-05-31 NOTE — Brief Op Note (Signed)
05/28/2014 - 05/30/2014  10:01 AM  PATIENT:  Jeffrey [name]  74 y.o. male  PRE-OPERATIVE DIAGNOSIS:  ICM  POST-OPERATIVE DIAGNOSIS:  ICM  PROCEDURE:  Procedure(s): ATTEMPTED REMOVAL OF IMPELLA LEFT VENTRICULAR ASSIST DEVICE (N/A) TRANSESOPHAGEAL ECHOCARDIOGRAM (TEE) (N/A) ARTERIAL LINE INSERTION (Right) IRRIGATION AND DEBRIDEMENT WOUND (N/A) EVACUATION HEMATOMA (N/A)  SURGEON:  Surgeon(s) and Role:    * Ivin Poot, MD - Primary  PHYSICIAN ASSISTANT:   ASSISTANTS: michelle  gengler RNFA  ANESTHESIA:   general  EBL:  Total I/O In: 1200 [I.V.:1200] Out: -   BLOOD ADMINISTERED:none  DRAINS: none   LOCAL MEDICATIONS USED:  NONE  SPECIMEN:  No Specimen  DISPOSITION OF SPECIMEN:  N/A  COUNTS:  YES  TOURNIQUET:  * No tourniquets in log *  DICTATION: .Dragon Dictation  PLAN OF CARE: Admit to inpatient   PATIENT DISPOSITION:  ICU - intubated and critically ill.   Delay start of Pharmacological VTE agent (>24hrs) due to surgical blood loss or risk of bleeding: yes

## 2014-06-01 ENCOUNTER — Inpatient Hospital Stay (HOSPITAL_COMMUNITY): Payer: Commercial Managed Care - HMO

## 2014-06-01 LAB — TYPE AND SCREEN
ABO/RH(D): A POS
Antibody Screen: NEGATIVE
Unit division: 0
Unit division: 0
Unit division: 0

## 2014-06-01 LAB — CBC
HCT: 29.4 % — ABNORMAL LOW (ref 39.0–52.0)
Hemoglobin: 10.6 g/dL — ABNORMAL LOW (ref 13.0–17.0)
MCH: 28.6 pg (ref 26.0–34.0)
MCHC: 36.1 g/dL — ABNORMAL HIGH (ref 30.0–36.0)
MCV: 79.2 fL (ref 78.0–100.0)
Platelets: 140 10*3/uL — ABNORMAL LOW (ref 150–400)
RBC: 3.71 MIL/uL — ABNORMAL LOW (ref 4.22–5.81)
RDW: 17.4 % — ABNORMAL HIGH (ref 11.5–15.5)
WBC: 65.7 10*3/uL (ref 4.0–10.5)

## 2014-06-01 LAB — COMPREHENSIVE METABOLIC PANEL
ALT: 16 U/L — ABNORMAL LOW (ref 17–63)
AST: 31 U/L (ref 15–41)
Albumin: 1.4 g/dL — ABNORMAL LOW (ref 3.5–5.0)
Alkaline Phosphatase: 102 U/L (ref 38–126)
Anion gap: 8 (ref 5–15)
BUN: 52 mg/dL — ABNORMAL HIGH (ref 6–20)
CO2: 21 mmol/L — ABNORMAL LOW (ref 22–32)
Calcium: 6.5 mg/dL — ABNORMAL LOW (ref 8.9–10.3)
Chloride: 97 mmol/L — ABNORMAL LOW (ref 101–111)
Creatinine, Ser: 1.99 mg/dL — ABNORMAL HIGH (ref 0.61–1.24)
GFR calc Af Amer: 37 mL/min — ABNORMAL LOW (ref >60)
GFR calc non Af Amer: 32 mL/min — ABNORMAL LOW (ref >60)
Glucose, Bld: 181 mg/dL — ABNORMAL HIGH (ref 65–99)
Potassium: 3.3 mmol/L — ABNORMAL LOW (ref 3.5–5.1)
Sodium: 126 mmol/L — ABNORMAL LOW (ref 135–145)
Total Bilirubin: 1.7 mg/dL — ABNORMAL HIGH (ref 0.3–1.2)
Total Protein: 3.6 g/dL — ABNORMAL LOW (ref 6.5–8.1)

## 2014-06-01 LAB — POCT I-STAT 3, ART BLOOD GAS (G3+)
ACID-BASE DEFICIT: 7 mmol/L — AB (ref 0.0–2.0)
BICARBONATE: 23.5 meq/L (ref 20.0–24.0)
BICARBONATE: 15.8 meq/L — AB (ref 20.0–24.0)
O2 SAT: 99 %
O2 Saturation: 98 %
PCO2 ART: 25.6 mmHg — AB (ref 35.0–45.0)
PH ART: 7.4 (ref 7.350–7.450)
PO2 ART: 113 mmHg — AB (ref 80.0–100.0)
Patient temperature: 37.3
TCO2: 25 mmol/L (ref 0–100)
TCO2: 17 mmol/L (ref 0–100)
pCO2 arterial: 35.9 mmHg (ref 35.0–45.0)
pH, Arterial: 7.426 (ref 7.350–7.450)
pO2, Arterial: 130 mmHg — ABNORMAL HIGH (ref 80.0–100.0)

## 2014-06-01 LAB — CARBOXYHEMOGLOBIN
Carboxyhemoglobin: 1.6 % — ABNORMAL HIGH (ref 0.5–1.5)
Methemoglobin: 1.5 % (ref 0.0–1.5)
O2 Saturation: 56 %
Total hemoglobin: 10.5 g/dL — ABNORMAL LOW (ref 13.5–18.0)

## 2014-06-01 LAB — GLUCOSE, CAPILLARY
GLUCOSE-CAPILLARY: 205 mg/dL — AB (ref 65–99)
GLUCOSE-CAPILLARY: 206 mg/dL — AB (ref 65–99)
Glucose-Capillary: 151 mg/dL — ABNORMAL HIGH (ref 65–99)
Glucose-Capillary: 186 mg/dL — ABNORMAL HIGH (ref 65–99)

## 2014-06-01 LAB — LACTATE DEHYDROGENASE: LDH: 454 U/L — ABNORMAL HIGH (ref 98–192)

## 2014-06-01 LAB — HEPARIN LEVEL (UNFRACTIONATED): Heparin Unfractionated: 0.17 IU/mL — ABNORMAL LOW (ref 0.30–0.70)

## 2014-06-01 LAB — APTT
aPTT: 73 seconds — ABNORMAL HIGH (ref 24–37)
aPTT: 151 seconds — ABNORMAL HIGH (ref 24–37)

## 2014-06-01 MED ORDER — DEXTROSE 5 % IV SOLN
15.0000 mg/h | INTRAVENOUS | Status: DC
  Administered 2014-06-01: 15 mg/h via INTRAVENOUS
  Filled 2014-06-01 (×2): qty 25

## 2014-06-01 MED ORDER — POTASSIUM CHLORIDE 10 MEQ/50ML IV SOLN
10.0000 meq | Freq: Once | INTRAVENOUS | Status: AC
  Administered 2014-06-01: 10 meq via INTRAVENOUS

## 2014-06-01 MED ORDER — EPINEPHRINE HCL 1 MG/ML IJ SOLN
0.5000 ug/min | INTRAMUSCULAR | Status: DC
  Filled 2014-06-01: qty 4

## 2014-06-01 MED ORDER — SODIUM BICARBONATE 8.4 % IV SOLN
50.0000 meq | Freq: Once | INTRAVENOUS | Status: AC
  Administered 2014-06-01: 50 meq via INTRAVENOUS
  Filled 2014-06-01: qty 50

## 2014-06-01 MED ORDER — SODIUM BICARBONATE 4.2 % IV SOLN
50.0000 meq | Freq: Once | INTRAVENOUS | Status: DC
  Filled 2014-06-01: qty 100

## 2014-06-03 LAB — POCT I-STAT 7, (LYTES, BLD GAS, ICA,H+H)
ACID-BASE DEFICIT: 8 mmol/L — AB (ref 0.0–2.0)
BICARBONATE: 18.7 meq/L — AB (ref 20.0–24.0)
Calcium, Ion: 1.06 mmol/L — ABNORMAL LOW (ref 1.13–1.30)
HCT: 29 % — ABNORMAL LOW (ref 39.0–52.0)
Hemoglobin: 9.9 g/dL — ABNORMAL LOW (ref 13.0–17.0)
O2 Saturation: 100 %
PH ART: 7.296 — AB (ref 7.350–7.450)
PO2 ART: 192 mmHg — AB (ref 80.0–100.0)
Patient temperature: 36
Potassium: 3.2 mmol/L — ABNORMAL LOW (ref 3.5–5.1)
Sodium: 128 mmol/L — ABNORMAL LOW (ref 135–145)
TCO2: 20 mmol/L (ref 0–100)
pCO2 arterial: 37.9 mmHg (ref 35.0–45.0)

## 2014-06-03 LAB — LIDOCAINE LEVEL: Lidocaine Lvl: 3.2 ug/mL (ref 1.5–5.0)

## 2014-06-03 SURGERY — REMOVAL, CARDIAC ASSIST DEVICE, IMPELLA
Anesthesia: General | Site: Chest

## 2014-06-03 MED FILL — Medication: Qty: 1 | Status: AC

## 2014-06-03 NOTE — Op Note (Signed)
NAME:  Jeffrey [name], Jeffrey [name] NO.:  [id number]  MEDICAL RECORD NO.:  [id number]  LOCATION:                                FACILITY:  [facility]  PHYSICIAN:  Ivin Poot, M.D.  DATE OF BIRTH:  January 15, 1940  DATE OF PROCEDURE:  05/26/2014 DATE OF DISCHARGE:  06-14-14                              OPERATIVE REPORT   OPERATIONS: 1. Placement of right femoral A-line. 2. Attempted wean of temporary percutaneous LVAD with irrigation of     LVAD insertion incision and evacuation of hematoma.  SURGEON:  Ivin Poot, M.D.  ANESTHESIA:  General.  PREOPERATIVE DIAGNOSES:  Status post combined coronary artery bypass graft and placement of temporary percutaneous left ventricular assist device Impella 5.0 with improved postop cardiac function.  DESCRIPTION OF PROCEDURE:  The patient was brought directly from the ICU to the cardiac surgery operating room after informed consent was obtained from the patient's family for potential removal of temporary LVAD.  The patient was placed supine on the operating room table.  A proper time-out was performed.  First, the right groin was prepped and draped as a sterile field.  Using the Seldinger technique, a femoral A-line was placed, flushed, connected to a transducer, and secured to the skin and a sterile dressing was applied.  Next, the right chest and right shoulder area were prepped and draped around the percutaneous LVAD.  A second proper time-out was performed. The incision was opened.  There was moderate amount of hematoma, which was evacuated.  The wound was irrigated.  While the wound was being irrigated and while the evacuation of hematoma was being performed, the performance level on the percutaneous LVAD was slowly reduced from level 5 down to level 3.  The cardiac performance was assessed with the use of the pulmonary artery catheter, hemodynamics, the transesophageal echo imaging of the left ventricle and right ventricle,  and the flows generated by the temporary LVAD.  Although it was apparent that the LV global function has improved since multivessel CABG, the patient still required significant doses of pressors including norepinephrine and vasopressin to remain normal blood pressure despite adequate cardiac output of 4.5 L/minute on the lowest level of LVAD support.  For that reason, the LVAD was not removed.  The wound after being irrigated was closed in layers using Vicryl and the skin was closed with interrupted skin staples.  A sterile dressing was applied.  The patient was returned back to the ICU in critical, but stable condition.     Ivin Poot, M.D.     PV/MEDQ  D:  05/26/2014  T:  06/10/2014  Job:  [id number]

## 2014-06-03 NOTE — Accreditation Note (Signed)
o Restraints not reported to CMS Pursuant to regulation 482.13 (G) (3) use of soft wrist restraints was logged on 05.23.2016 at 0720 by Evette Cristal, RN, Patient Financial controller.

## 2014-06-03 NOTE — Discharge Summary (Signed)
Date of admission 06/11/2014  Date of discharge-death 22-Jun-2014  Admission diagnoses 1 acute ST elevation MI with acute pulmonary edema and cardiogenic shock  2 diabetes mellitus  3 atrial fibrillation  4 recurrent ventricular tachycardia requiring DC cardioversion on multiple occasions  5 severe LV dysfunction  Discharge diagnoses   Acute MI with cardiogenic shock palmar edema requiring intra-aortic balloon pump for hemodynamic support  Ventricular Tachycardia, recurrent requiring DC cardioversions  Diabetes mellitus  Postoperative sepsis with documented C. difficile colitis and possible ischemic colitis  Postoperative multisystem failure including acute renal failure and ventilator dependent acute pulmonary insufficiency  Procedures, operations  #1 cardiac catheterization with placement of intra-aortic balloon pump, right heart catheterization--05/15/2014  #2 multivessel CABG with simultaneous placement of percutaneous LVAD- Impella 5.0       05/24/2014  #3 irrigation and evacuation of hematoma of axillary artery LVAD insertion site-      May 22,016      Summary of hospital course  The patient is a 74 year old Caucasian diabetic reformed smoker was admitted to the hospital with acute myocardial infarction, cardiogenic shock with ejection fraction of 15%, pulmonary edema and elevated cardiac enzymes. 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 A balloon pump was placed and a right heart catheterization was performed which documented low cardiac output and the patient was treated with inotropes. The patient was neurologically intact and and adequate renal function and was felt to be candidate for high-risk multivessel CABG-no other therapy was felt to be beneficial. The procedure was discussed in detail the patient and his family including the known significant risks of MI, death, stroke.  The patient was for  surgery and was taken to the OR for CABG 4 with simultaneous placement of a temporary LVAD placed via the right subclavian artery through a 10 mm Hemashield graft. The patient did well hemodynamically. Postoperatively the patient's neuro status was intact despite having a known carotid artery stenosis for which he was evaluated by fashion for surgery. The plan was to leave the temporary LVAD in place for 7 days. The patient was not wean from the ventilator because of postoperative edema initially which suddenly cleared. However he developed some agitation and needed to be sedated so as not to pull on any of his tubes. The patient was kept on heparin per protocol for adequate ligation of the temporary LVAD.  About a week after surgery the patient developed fever and elevated white count. He developed diarrhea associated with tube feeds which was C. difficile positive. He was placed on broad-spectrum antibiotics and continued to have elevation in white count and appeared septic. All of his central lines were changed including a line, Swan, and blood cultures and sputum cultures are negative. His LVAD site was irrigated and cleaned out in the OR and cultures were negative.  The patient had then progressive decline in renal function, severe vasodilatation requiring Vasopressin, and multiple episodes of ventricular tachycardia.  The situation was discussed with the patient's family. Is felt that the patient had no realistic chance for meaningful recovery and that further aggressive care would be futile.  the patient developed V. tach-cardiac arrest for which he could not be resuscitated and expired 2022/06/22.

## 2014-06-04 ENCOUNTER — Encounter (HOSPITAL_COMMUNITY): Payer: Self-pay | Admitting: Cardiothoracic Surgery

## 2014-06-04 LAB — CULTURE, BLOOD (ROUTINE X 2)
CULTURE: NO GROWTH
Culture: NO GROWTH

## 2014-06-12 NOTE — Progress Notes (Signed)
Advanced Heart Failure Rounding Note   Subjective:    Underwent CABG and Impella 5.0 placement on 5/12.  Remains intubated and sedated. 40% FiO2.   Developed c.diff colitis 2 on 5/18 associated with septic shock. Abx adjusted. Taken back to OR 5/20 for possible Impella removal but continued to be acidotic and require high dose pressor support despite some improvement in LV function on TEE. Thus Impella not removed. Impella site opened and cleaned. Had episode of VT and got DC-CV in OR  Shocked again for recurrent VT last night. Output from chest tube increased. Heparin stopped and now improved. Heparin back on.  Continues on high-dose pressors. Co-ox 56%. WBC 66k. Miniimal urine output despite lasix Creatine worse. Remains markedly volume overloaded. cvp 15  Impella - on P-8 with good waveform. Platelets stable    Objective:    Vital Signs:   Temp:  [97.9 F (36.6 C)-100 F (37.8 C)] 99.3 F (37.4 C) (05/21 0830) Pulse Rate:  [25-99] 29 (05/21 0830) Resp:  [0-27] 8 (05/21 0830) BP: (48-107)/(14-80) 75/61 mmHg (05/21 0800) SpO2:  [89 %-100 %] 96 % (05/21 0830) Arterial Line BP: (85-141)/(57-72) 120/67 mmHg (05/21 0830) FiO2 (%):  [40 %-50 %] 40 % (05/21 0800) Weight:  [113.3 kg (249 lb 12.5 oz)] 113.3 kg (249 lb 12.5 oz) (05/21 0300) Last BM Date: 05/29/2014 (+diarrhea, flexiseal, +c-diff)  Weight change: Filed Weights   06/02/2014 0300 06/06/2014 0400 2014-06-20 0300  Weight: 109.2 kg (240 lb 11.9 oz) 108.2 kg (238 lb 8.6 oz) 113.3 kg (249 lb 12.5 oz)    Intake/Output:   Intake/Output Summary (Last 24 hours) at 06-20-2014 0843 Last data filed at 2014/06/20 0700  Gross per 24 hour  Intake 10989.3 ml  Output   2355 ml  Net 8634.3 ml     Physical Exam: General: Intubated sedated HEENT: ETT Neck: supple. RIJ sheath Cor: R subclavian Impella sheath. Sternal dressing Iregular rhythm, controlled rate. impella hum Lungs: rhonch Abdomen: soft, nontender, + distended. No  hepatosplenomegaly. No bruits or masses. hypoactive bowel sounds. Extremities: no cyanosis, clubbing, rash,  R and LLE SCDs. 4+ edema. Rectal tube Neuro: intubated sedated  Telemetry: AF  Labs: Basic Metabolic Panel:  Recent Labs Lab 05/26/14 0326  05/26/14 1330  05/27/14 0252  05/29/14 0300  05/26/2014 0445 05/21/2014 1350 05/21/2014 0400 05/22/2014 1057 2014/06/20 0300  NA  --   < >  --   < > 129*  < > 128*  < > 126* 126* 124* 127* 126*  K  --   < > 3.7  < > 3.6  < > 3.9  < > 3.7 3.9 3.7 3.7 3.3*  CL  --   < >  --   < > 96*  < > 97*  < > 97* 95* 98* 97* 97*  CO2  --   --   --   --  26  < > 25  --  19*  --  18* 21* 21*  GLUCOSE  --   < >  --   < > 106*  < > 203*  < > 95 203* 60* 111* 181*  BUN  --   < >  --   < > 15  < > 18  < > 32* 39* 47* 48* 52*  CREATININE  --   < >  --   < > 1.30*  < > 1.33*  < > 1.63* 1.90* 1.93* 1.84* 1.99*  CALCIUM  --   --   --   --  7.5*  < > 7.3*  --  6.7*  --  6.8* 6.8* 6.5*  MG 1.7  --  2.2  --  1.7  --  1.6*  --   --   --   --   --   --   PHOS  --   --   --   --   --   --  2.7  --   --   --   --   --   --   < > = values in this interval not displayed.  Liver Function Tests:  Recent Labs Lab 05/28/14 0408 05/29/14 0300 05/16/2014 0445 06/09/2014 0400 06/13/14 0300  AST 24 26 26  33 31  ALT 19 23 19 21  16*  ALKPHOS 85 107 84 96 102  BILITOT 0.7 0.8 1.1 1.1 1.7*  PROT 3.7* 4.3* 3.9* 3.7* 3.6*  ALBUMIN 1.7* 1.7* 1.8* 1.4* 1.4*   CBC:  Recent Labs Lab 05/18/2014 1245 05/17/2014 1350 06/05/2014 0400 05/14/2014 1057 05/19/2014 2025 2014/06/13 0300  WBC 49.2*  --  65.8* 68.1* 62.1* 65.7*  HGB 10.2* 10.5* 10.9* 9.6* 9.9* 10.6*  HCT 29.7* 31.0* 30.5* 27.6* 28.8* 29.4*  MCV 81.4  --  79.6 80.0 80.0 79.2  PLT 135*  --  140* 132* 133* 140*    Cardiac Enzymes: No results for input(s): CKTOTAL, CKMB, CKMBINDEX, TROPONINI in the last 168 hours.  BNP: BNP (last 3 results)  Recent Labs  05/22/2014 1630  BNP 885.0*    ProBNP (last 3 results) No results  for input(s): PROBNP in the last 8760 hours.    Other results:  Imaging: Dg Abd 1 View  06/11/2014   CLINICAL DATA:  Distended abdomen  EXAM: ABDOMEN - 1 VIEW  COMPARISON:  05/27/2014  FINDINGS: The feeding catheter is again identified in the fourth portion of the duodenum. Nasogastric catheter extends just beyond the gastroesophageal junction scattered large and small bowel gas is noted. A single gas-filled loop of bowel is noted in the mid abdomen likely representing the sigmoid colon. No definitive free air is seen.  IMPRESSION: Tubes and lines as described.  Single gas distended loop of sigmoid colon. No definitive obstruction is noted.   Electronically Signed   By: Inez Catalina M.D.   On: 05/18/2014 07:09   Dg Chest Port 1 View  05/15/2014   CLINICAL DATA:  Impella left ventricular assist device present.  EXAM: PORTABLE CHEST - 1 VIEW 11:05 a.m.  COMPARISON:  06/11/2014 at 7:03 a.m.  FINDINGS: Endotracheal tube, NG tube, new Swan-Ganz catheter, bilateral central venous catheters, and Impella left ventricular assist device are in place. Swan-Ganz catheter tip is in the main pulmonary artery. Mediastinal drainage catheter is unchanged at the inferior aspect of the heart.  Right lung is clear. Slight left perihilar haziness is felt to be due to a shallow inspiration.  IMPRESSION: Interval insertion of Swan-Ganz catheter.  No other change.   Electronically Signed   By: Lorriane Shire M.D.   On: 05/15/2014 11:20   Dg Chest Port 1 View  05/31/2014   CLINICAL DATA:  Check chest tube position  EXAM: PORTABLE CHEST - 1 VIEW  COMPARISON:  06/11/2014  FINDINGS: Cardiac shadow is stable. Left subclavian central venous catheter is again noted and stable. Endotracheal tube is seen 3.7 cm above the carina. A nasogastric catheter and feeding catheter are again noted. Mediastinal drain and left thoracostomy catheter are again noted. An Impella ventricular assist device is noted. No pneumothorax is seen.  Increasing  right basilar atelectasis and effusion is noted. Mild vascular congestion remains.  IMPRESSION: Tubes and lines as described.  Mild vascular congestion.  Increasing right basilar atelectasis and effusion.   Electronically Signed   By: Inez Catalina M.D.   On: 05/17/2014 07:11   Dg Chest Port 1 View  05/21/2014   CLINICAL DATA:  Encounter for central line placement. Status post CABG and Impella placement.  EXAM: PORTABLE CHEST - 1 VIEW  COMPARISON:  05/31/2014  FINDINGS: Again noted is an Impella device in the region of the left ventricle from a right subclavian approach. Again noted are chest tubes without a large pneumothorax. There is a new left subclavian central line with the tip in the lower SVC region. PICC line tip in the SVC region. There is a right jugular central line with the tip in the upper SVC region. Nasogastric tube and feeding tube extend to the abdomen. Difficult to identify the endotracheal tip due to multiple overlying devices. Slightly prominent interstitial lung densities bilaterally. Basilar densities could represent atelectasis. Heart size is grossly stable with median sternotomy wires.  IMPRESSION: Placement of a left subclavian central line with the tip in the lower SVC region. Negative for pneumothorax.  Multiple support apparatuses as described.  Slightly prominent interstitial lung markings could represent atelectasis or mild edema.   Electronically Signed   By: Markus Daft M.D.   On: 05/14/2014 14:20     Medications:     Scheduled Medications: . sodium chloride   Intravenous Once  . sodium chloride   Intravenous Once  . antiseptic oral rinse  7 mL Mouth Rinse QID  . aspirin EC  325 mg Oral Daily   Or  . aspirin  324 mg Per Tube Daily  . bisacodyl  10 mg Oral Daily   Or  . bisacodyl  10 mg Rectal Daily  . chlorhexidine  15 mL Mouth Rinse BID  . docusate sodium  200 mg Oral Daily  . feeding supplement (PRO-STAT SUGAR FREE 64)  30 mL Per Tube 5 X Daily  . furosemide   40 mg Intravenous 3 times per day  . insulin aspart  0-24 Units Subcutaneous 6 times per day  . insulin detemir  14 Units Subcutaneous Daily  . metroNIDAZOLE  500 mg Oral TID  . pantoprazole (PROTONIX) IV  40 mg Intravenous Q12H  . piperacillin-tazobactam (ZOSYN)  IV  3.375 g Intravenous 3 times per day  . sodium bicarbonate  50 mEq Intravenous Once  . sodium chloride  3 mL Intravenous Q12H  . vancomycin  1,250 mg Intravenous Q24H  . vancomycin  500 mg Oral 4 times per day    Infusions: . sodium chloride Stopped (05/27/14 0120)  . sodium chloride    . sodium chloride Stopped (06/11/2014 0640)  . amiodarone 60 mg/hr (06-07-2014 0600)  . dexmedetomidine 0.8 mcg/kg/hr (Jun 07, 2014 0604)  . dextrose 5 % and 0.9% NaCl 50 mL/hr at 05/14/2014 0640  . DOBUTamine 2.5 mcg/kg/min (Jun 07, 2014 0600)  . impella catheter heparin 50 unit/mL in dextrose 5%    . heparin 600 Units/hr (06/07/2014 0600)  . lactated ringers 10 mL/hr at 06/03/2014 0600  . lidocaine 1 mg/min (Jun 07, 2014 0700)  . midazolam (VERSED) infusion 3 mg/hr (Jun 07, 2014 0700)  . norepinephrine (LEVOPHED) Adult infusion 42 mcg/min (2014/06/07 0700)  . vasopressin (PITRESSIN) infusion - *FOR SHOCK* 0.05 Units/min (2014/06/07 0700)    PRN Medications: sodium chloride, levalbuterol, morphine injection, ondansetron (ZOFRAN) IV, potassium chloride, sodium chloride, traMADol  Assessment:   1. Cardiogenic shock 2. Ventricular fibrillation arrest 5/6 in setting of milrinone use 3. 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 3v CAD/NSTEMI: Occluded RCA, subtotally occluded prox LAD, 90% LCx 5. DM2 6. Atrial fibrillation, newly diagnosed on admission -> NSR due to defib 5/10 7. Microcytic anemia/thrombocytopenia  8. Acute kidney injury, 9. 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 Thrombocytopenia 11. Acute delirium 12. Hyponatremia 13. Recurrent VT 14.  Septic shock 5/19 15. C.diff colitis  16. Severe protein calorie malnutrition   Plan/Discussion:    S/p  CABG with Impella 5.0 support 05/27/2014.   He has profound septic and cardiogenic shock complicated by multi-system organ failure and  recurrent VT/VF requiring DC-CV. He is requiring high-dose pressor support as well as broad spectrum abx. WBC continues to climb and concern remains for possible dead bowel.   Little chance he will survive this. Will gather his family and discuss move to comfort care.   The patient is critically ill with multiple organ systems failure and requires high complexity decision making for assessment and support, frequent evaluation and titration of therapies, application of advanced monitoring technologies and extensive interpretation of multiple databases.   Critical Care Time devoted to patient care services described in this note is 35 Minutes.  Glori Bickers MD 06/14/14 8:43 AM

## 2014-06-12 NOTE — Progress Notes (Signed)
At 1246 the patient converted from atrial fibrillation into sustained vtach. Attempted bedside defibrillation times 2, without conversion back into baseline rhythm. A code blue was initiated and MD;s Roxan Hockey and Bens[place]hon were paged to make aware. Approximately 3 minutes into code, MD [place] phoned to inform us to stop resusitative  efforts and to make the patient comfortable and allow family to be with patient. (see code sheet)  [facility] called- pt is not eligible to donate r/t active C-Diff infection. Reference number # [id number]  ME called to inform of restraint death. Was told he was not a case as death was medically related  All lines removed by Roxan Hockey, including impella.   Family given time to visit with patient after debridement.   Lorre Munroe

## 2014-06-12 NOTE — Progress Notes (Signed)
[address]       [place] [address]             [phone]      Called at 12:49 PM that Jeffrey [name] had arrested. Dr. Haroldine Laws had been contacted and stopped CPR, discontinued all drips and instituted comfort measures.  When I arrived his family was present. I met with them and explained the medical futility of the situation. There was no reason to continue Impella or vent support. They were in agreement to stop both. He was in asystole when extubated and Impella turned off. No spontaneous respiratory effort.   Time of death 12:49. When CPR discontinued.  The family does not want an autopsy. All lines were removed  Remo Lipps C. Roxan Hockey, MD [facility] [phone]

## 2014-06-12 NOTE — Progress Notes (Signed)
Chaplain responded to code blue. Pt pastor is present. Pt family had word of prayer at bedside and are currently singing spiritual songs. Pt has strong and supportive family. Chaplain escorted family back to pt room. Page chaplain as needed.    06-05-2014 1300  Clinical Encounter Type  Visited With Family  Visit Type Initial;Spiritual support;Patient actively dying  Spiritual Encounters  Spiritual Needs Emotional;Grief support;Prayer  Stress Factors  Family Stress Factors Loss    Rhayne Chatwin, Barbette Hair, Chaplain 06-05-2014 1:27 PM

## 2014-06-12 NOTE — Procedures (Signed)
Extubation Procedure Note  Patient Details:   Name: Jeffrey [name] DOB: 1940-05-09 MRN: [id number]   Airway Documentation:     Evaluation    Pt terminally extubated per MD order. RT will continue to monitor.    Mariam Dollar 2014-06-11, 1:59 PM

## 2014-06-12 NOTE — Progress Notes (Signed)
1 Day Post-Op Procedure(s) (LRB): ATTEMPTED REMOVAL OF IMPELLA LEFT VENTRICULAR ASSIST DEVICE (N/A) TRANSESOPHAGEAL ECHOCARDIOGRAM (TEE) (N/A) ARTERIAL LINE INSERTION (Right) IRRIGATION AND DEBRIDEMENT WOUND (N/A) EVACUATION HEMATOMA (N/A) Subjective: Intubated, sedated  Objective: Vital signs in last 24 hours: Temp:  [97.9 F (36.6 C)-100 F (37.8 C)] 99.5 F (37.5 C) (05/21 0700) Pulse Rate:  [25-99] 82 (05/21 0800) Cardiac Rhythm:  [-] Atrial fibrillation;Atrial flutter (05/21 0700) Resp:  [0-27] 27 (05/21 0800) BP: (48-107)/(14-80) 75/61 mmHg (05/21 0800) SpO2:  [89 %-100 %] 97 % (05/21 0800) Arterial Line BP: (85-141)/(57-72) 112/59 mmHg (05/21 0700) FiO2 (%):  [40 %-50 %] 40 % (05/21 0800) Weight:  [249 lb 12.5 oz (113.3 kg)] 249 lb 12.5 oz (113.3 kg) (05/21 0300)  Hemodynamic parameters for last 24 hours: PAP: (30-58)/(11-35) 43/20 mmHg CVP:  [12 mmHg-26 mmHg] 13 mmHg CO:  [3.9 L/min-4.6 L/min] 4 L/min CI:  [1.9 L/min/m2-2.2 L/min/m2] 2 L/min/m2  Intake/Output from previous day: 05/20 0701 - 05/21 0700 In: 10989.3 [I.V.:9461.9; Blood:332; NG/GT:240; IV Piggyback:700] Out: 2355 [Urine:940; Emesis/NG output:200; Stool:350; Chest Tube:865] Intake/Output this shift:    General appearance: intubated Neurologic: responds to painful stimuli Heart: irregularly irregular rhythm Lungs: rhonchi bilaterally Abdomen: distended Extremities: edema -  Lab Results:  Recent Labs  05/15/2014 2025 2014/06/06 0300  WBC 62.1* 65.7*  HGB 9.9* 10.6*  HCT 28.8* 29.4*  PLT 133* 140*   BMET:  Recent Labs  05/15/2014 1057 06/06/2014 0300  NA 127* 126*  K 3.7 3.3*  CL 97* 97*  CO2 21* 21*  GLUCOSE 111* 181*  BUN 48* 52*  CREATININE 1.84* 1.99*  CALCIUM 6.8* 6.5*    PT/INR: No results for input(s): LABPROT, INR in the last 72 hours. ABG    Component Value Date/Time   PHART 7.400 2014/06/06 0339   HCO3 15.8* 2014/06/06 0339   TCO2 17 06-06-2014 0339   ACIDBASEDEF 7.0*  2014-06-06 0339   O2SAT 98.0 2014-06-06 0339   CBG (last 3)   Recent Labs  06/02/2014 1135 05/28/2014 1531 05/31/2014 2025  GLUCAP 76 116* 202*    Assessment/Plan: S/P Procedure(s) (LRB): ATTEMPTED REMOVAL OF IMPELLA LEFT VENTRICULAR ASSIST DEVICE (N/A) TRANSESOPHAGEAL ECHOCARDIOGRAM (TEE) (N/A) ARTERIAL LINE INSERTION (Right) IRRIGATION AND DEBRIDEMENT WOUND (N/A) EVACUATION HEMATOMA (N/A) Remains critically ill  CV- index 2.0, co-ox 56  On impella, levophed, vasopressin, epi and dobutamine  Continues to have VT requiring cardioversion- on amiodarone and lidocaine  RESP- VDRF, continue full support  RENAL_ UO fair, creatinine slowly rising  ENDO- CBG up   GI- c diff+  Leukocytosis- WBC up slightly, still > 60K  Critically ill with multiple organ system failure, prognosis grim   LOS: 18 days    Melrose Nakayama 06-06-14

## 2014-06-12 DEATH — deceased

## 2014-07-02 ENCOUNTER — Encounter: Payer: Commercial Managed Care - HMO | Admitting: Vascular Surgery

## 2017-04-08 IMAGING — CR DG CHEST 1V PORT
1 series · 1 of 1 positions shown · non-contrast
Comparison: 05/20/2014

CLINICAL DATA: Recent aortic balloon pump adjustment

EXAM:
PORTABLE CHEST - 1 VIEW

[AP]
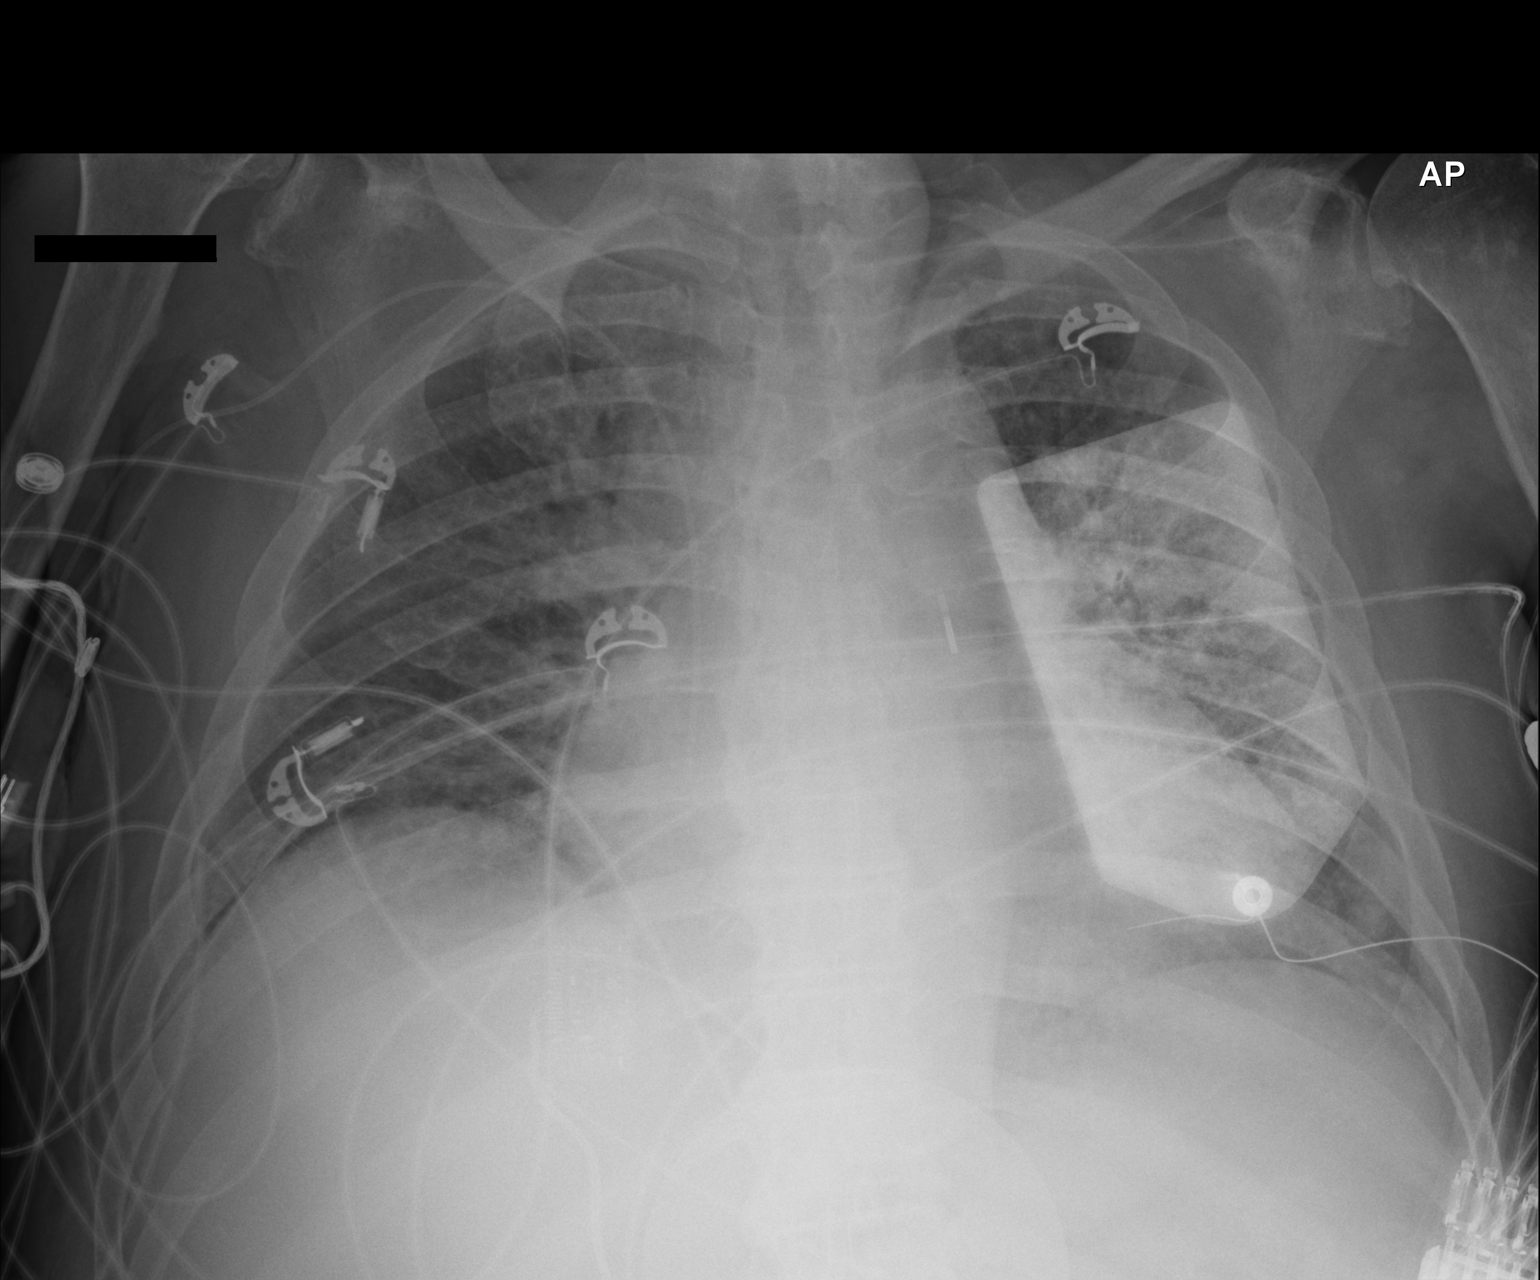

[1 of 1 positions shown; findings below may reference images not displayed]

FINDINGS: Right-sided PICC line is again identified and stable. The
intra-aortic balloon pump is again identified overlying the left
mainstem bronchus. Cardiac shadow remains enlarged. The lungs are
hypoinflated without focal confluent infiltrate. Crowding of the
vascular markings is noted.
IMPRESSION: Tubes and lines as described.

Poor inspiratory effort with vascular crowding.

## 2017-04-18 IMAGING — CR DG ABDOMEN 1V
1 series · 1 of 1 positions shown · non-contrast
Comparison: 05/27/2014

CLINICAL DATA: Distended abdomen

EXAM:
ABDOMEN - 1 VIEW

[AP]
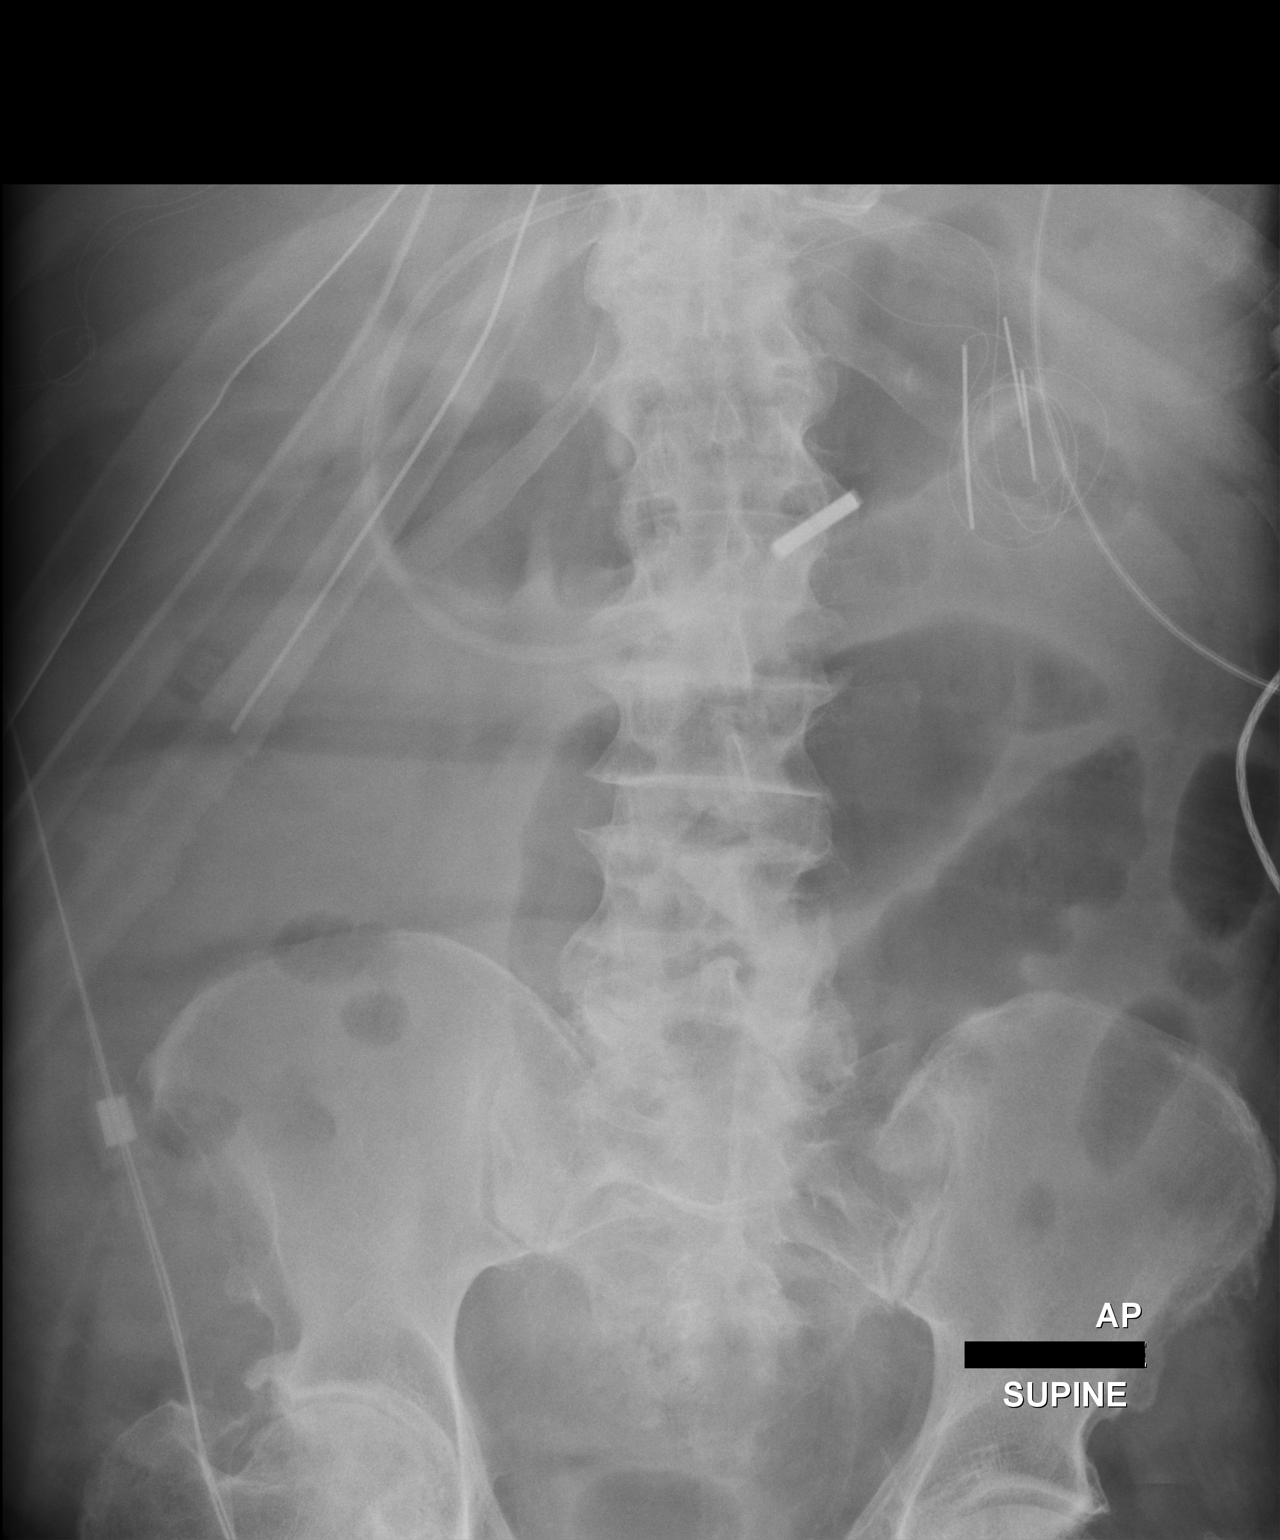

[1 of 1 positions shown; findings below may reference images not displayed]

FINDINGS: The feeding catheter is again identified in the fourth portion of
the duodenum. Nasogastric catheter extends just beyond the
gastroesophageal junction scattered large and small bowel gas is
noted. A single gas-filled loop of bowel is noted in the mid abdomen
likely representing the sigmoid colon. No definitive free air is
seen.
IMPRESSION: Tubes and lines as described.

Single gas distended loop of sigmoid colon. No definitive
obstruction is noted.
